# Patient Record
Sex: Female | Born: 1935 | Race: White | Hispanic: No | Smoking: Former smoker
Health system: Southern US, Community
[De-identification: ages and names within clinical notes are randomized; demographics above are authoritative.]

## PROBLEM LIST (undated history)

## (undated) DIAGNOSIS — N200 Calculus of kidney: Secondary | ICD-10-CM

## (undated) DIAGNOSIS — M4800 Spinal stenosis, site unspecified: Secondary | ICD-10-CM

## (undated) DIAGNOSIS — I2510 Atherosclerotic heart disease of native coronary artery without angina pectoris: Secondary | ICD-10-CM

## (undated) DIAGNOSIS — F32A Depression, unspecified: Secondary | ICD-10-CM

## (undated) DIAGNOSIS — H409 Unspecified glaucoma: Secondary | ICD-10-CM

## (undated) DIAGNOSIS — F329 Major depressive disorder, single episode, unspecified: Secondary | ICD-10-CM

## (undated) DIAGNOSIS — G919 Hydrocephalus, unspecified: Secondary | ICD-10-CM

## (undated) DIAGNOSIS — E785 Hyperlipidemia, unspecified: Secondary | ICD-10-CM

## (undated) DIAGNOSIS — E119 Type 2 diabetes mellitus without complications: Secondary | ICD-10-CM

## (undated) HISTORY — PX: FOOT SURGERY: SHX648

## (undated) HISTORY — PX: VENTRICULOPERITONEAL SHUNT: SHX204

## (undated) HISTORY — PX: APPENDECTOMY: SHX54

## (undated) HISTORY — PX: CARDIAC SURGERY: SHX584

## (undated) HISTORY — DX: Type 2 diabetes mellitus without complications: E11.9

## (undated) HISTORY — PX: KYPHOPLASTY: SHX5884

## (undated) HISTORY — PX: BACK SURGERY: SHX140

## (undated) HISTORY — DX: Atherosclerotic heart disease of native coronary artery without angina pectoris: I25.10

## (undated) HISTORY — DX: Calculus of kidney: N20.0

---

## 1991-11-24 HISTORY — PX: CORONARY ARTERY BYPASS GRAFT: SHX141

## 2003-12-02 ENCOUNTER — Other Ambulatory Visit: Payer: Self-pay

## 2003-12-06 ENCOUNTER — Other Ambulatory Visit: Payer: Self-pay

## 2004-03-07 ENCOUNTER — Inpatient Hospital Stay (HOSPITAL_COMMUNITY): Admission: RE | Admit: 2004-03-07 | Discharge: 2004-03-13 | Payer: Self-pay | Admitting: Neurosurgery

## 2004-03-13 ENCOUNTER — Inpatient Hospital Stay (HOSPITAL_COMMUNITY)
Admission: RE | Admit: 2004-03-13 | Discharge: 2004-03-28 | Payer: Self-pay | Admitting: Physical Medicine & Rehabilitation

## 2004-06-13 ENCOUNTER — Ambulatory Visit (HOSPITAL_COMMUNITY): Admission: RE | Admit: 2004-06-13 | Discharge: 2004-06-13 | Payer: Self-pay | Admitting: Internal Medicine

## 2004-07-04 ENCOUNTER — Inpatient Hospital Stay (HOSPITAL_COMMUNITY): Admission: RE | Admit: 2004-07-04 | Discharge: 2004-07-04 | Payer: Self-pay | Admitting: Neurosurgery

## 2004-07-13 ENCOUNTER — Inpatient Hospital Stay (HOSPITAL_COMMUNITY): Admission: RE | Admit: 2004-07-13 | Discharge: 2004-07-17 | Payer: Self-pay | Admitting: Neurosurgery

## 2004-07-13 ENCOUNTER — Encounter (INDEPENDENT_AMBULATORY_CARE_PROVIDER_SITE_OTHER): Payer: Self-pay | Admitting: *Deleted

## 2004-09-26 ENCOUNTER — Ambulatory Visit: Payer: Self-pay | Admitting: Specialist

## 2004-12-02 ENCOUNTER — Ambulatory Visit (HOSPITAL_COMMUNITY): Admission: RE | Admit: 2004-12-02 | Discharge: 2004-12-02 | Payer: Self-pay | Admitting: Neurosurgery

## 2004-12-19 ENCOUNTER — Inpatient Hospital Stay (HOSPITAL_COMMUNITY): Admission: RE | Admit: 2004-12-19 | Discharge: 2004-12-22 | Payer: Self-pay | Admitting: Neurosurgery

## 2005-01-12 ENCOUNTER — Encounter: Admission: RE | Admit: 2005-01-12 | Discharge: 2005-01-12 | Payer: Self-pay | Admitting: Neurosurgery

## 2005-03-23 ENCOUNTER — Encounter: Admission: RE | Admit: 2005-03-23 | Discharge: 2005-03-23 | Payer: Self-pay | Admitting: Neurosurgery

## 2005-04-30 ENCOUNTER — Ambulatory Visit: Payer: Self-pay | Admitting: Family Medicine

## 2005-06-20 ENCOUNTER — Encounter (HOSPITAL_COMMUNITY): Admission: RE | Admit: 2005-06-20 | Discharge: 2005-07-20 | Payer: Self-pay | Admitting: Orthopaedic Surgery

## 2005-07-24 ENCOUNTER — Encounter: Admission: RE | Admit: 2005-07-24 | Discharge: 2005-07-24 | Payer: Self-pay | Admitting: Neurosurgery

## 2005-11-19 ENCOUNTER — Encounter: Admission: RE | Admit: 2005-11-19 | Discharge: 2005-11-19 | Payer: Self-pay | Admitting: Neurosurgery

## 2005-12-16 ENCOUNTER — Emergency Department (HOSPITAL_COMMUNITY): Admission: EM | Admit: 2005-12-16 | Discharge: 2005-12-16 | Payer: Self-pay | Admitting: Emergency Medicine

## 2006-06-25 ENCOUNTER — Encounter: Admission: RE | Admit: 2006-06-25 | Discharge: 2006-06-25 | Payer: Self-pay | Admitting: Neurosurgery

## 2006-08-21 ENCOUNTER — Ambulatory Visit: Payer: Self-pay | Admitting: Family Medicine

## 2007-05-06 ENCOUNTER — Encounter: Payer: Self-pay | Admitting: Internal Medicine

## 2007-05-06 ENCOUNTER — Ambulatory Visit (HOSPITAL_COMMUNITY): Admission: RE | Admit: 2007-05-06 | Discharge: 2007-05-06 | Payer: Self-pay | Admitting: Internal Medicine

## 2007-05-06 ENCOUNTER — Ambulatory Visit: Payer: Self-pay | Admitting: Internal Medicine

## 2007-09-19 ENCOUNTER — Encounter: Admission: RE | Admit: 2007-09-19 | Discharge: 2007-09-19 | Payer: Self-pay | Admitting: Neurosurgery

## 2007-10-01 ENCOUNTER — Encounter (HOSPITAL_COMMUNITY): Admission: RE | Admit: 2007-10-01 | Discharge: 2007-10-31 | Payer: Self-pay | Admitting: Orthopaedic Surgery

## 2007-10-23 ENCOUNTER — Ambulatory Visit: Payer: Self-pay | Admitting: Family Medicine

## 2008-03-10 ENCOUNTER — Ambulatory Visit (HOSPITAL_COMMUNITY): Admission: RE | Admit: 2008-03-10 | Discharge: 2008-03-10 | Payer: Self-pay | Admitting: Internal Medicine

## 2008-04-20 ENCOUNTER — Encounter: Admission: RE | Admit: 2008-04-20 | Discharge: 2008-04-20 | Payer: Self-pay | Admitting: Neurosurgery

## 2008-09-21 ENCOUNTER — Encounter (HOSPITAL_COMMUNITY): Admission: RE | Admit: 2008-09-21 | Discharge: 2008-10-21 | Payer: Self-pay | Admitting: Orthopedic Surgery

## 2008-10-21 ENCOUNTER — Encounter: Admission: RE | Admit: 2008-10-21 | Discharge: 2008-10-21 | Payer: Self-pay | Admitting: Neurology

## 2008-10-27 ENCOUNTER — Encounter (HOSPITAL_COMMUNITY): Admission: RE | Admit: 2008-10-27 | Discharge: 2008-11-26 | Payer: Self-pay | Admitting: Orthopedic Surgery

## 2009-03-23 ENCOUNTER — Ambulatory Visit (HOSPITAL_COMMUNITY): Admission: RE | Admit: 2009-03-23 | Discharge: 2009-03-23 | Payer: Self-pay | Admitting: Internal Medicine

## 2009-04-07 ENCOUNTER — Ambulatory Visit (HOSPITAL_COMMUNITY): Admission: RE | Admit: 2009-04-07 | Discharge: 2009-04-07 | Payer: Self-pay | Admitting: Internal Medicine

## 2010-06-20 ENCOUNTER — Ambulatory Visit (HOSPITAL_COMMUNITY): Admission: RE | Admit: 2010-06-20 | Discharge: 2010-06-20 | Payer: Self-pay | Admitting: Internal Medicine

## 2011-01-06 ENCOUNTER — Encounter: Payer: Self-pay | Admitting: Neurosurgery

## 2011-05-04 NOTE — Op Note (Signed)
[name], [name]             ACCOUNT NO.:  [id number]   MEDICAL RECORD NO.:  [id number]          PATIENT TYPE:  INP   LOCATION:  2899                         FACILITY:  [facility]   PHYSICIAN:  Clydene Fake, M.D.  DATE OF BIRTH:  20-Dec-1932   DATE OF PROCEDURE:  12/19/2004  DATE OF DISCHARGE:                                 OPERATIVE REPORT   PREOPERATIVE DIAGNOSIS:  Hydrocephalus, obstructive, chronic.   POSTOPERATIVE DIAGNOSIS:  Hydrocephalus, obstructive, chronic.   OPERATION PERFORMED:  Right posterior ventriculoperitoneal shunt placement  with valve programmed to 150 mmHg.   SURGEON:  Clydene Fake, M.D.   ASSISTANT:  Danae Orleans. Venetia Maxon, M.D.   ANESTHESIA:  General endotracheal.   ESTIMATED BLOOD LOSS:  Minimal.   BLOOD REPLACED:  None.   DRAINS:  None.   COMPLICATIONS:  None.   INDICATIONS FOR PROCEDURE:  The patient is a 77 year old woman with trouble  walking, found to have enlarged left ventricles and the patient is brought  in for ventriculoperitoneal shunt.   DESCRIPTION OF PROCEDURE:  The patient was brought to the operating room and  general anesthesia was induced.  The patient was prepped and draped in a  sterile fashion on the right side.  An incision was then made after  injecting the area with 10 mL of 1% lidocaine with epinephrine in the right  parietal area.  Incision taken down to the skull and hemostasis obtained  with Bovie cautery.  The high speed drill was used to make a bur hole.  Hemostasis of the edges obtained with bone wax and Gelfoam and Thrombin.  This was then washed out.  The dura was coagulated with bipolar.  In the  right upper quadrant, the site of incision was injected with 10 mL of 1%  lidocaine with epinephrine.  The incision was then made just below the  costal margin.  Incision taken down to the fascia.  Shunt passer was then  passed from the incision in the head down to the incision in the abdomen.  The dura was incised with  a 15 blade.  Hemostasis was obtained with bipolar  cautery.  Ventricular catheter was then placed into the ventricles and a  manometer was used to measure the pressure.  It measured 17 cm.  The  programmable valve was programmed to 150 mmHg.  The valve and __________  distal tubing was then passed out through the shunt passer.  The shunt  passer then removed.  The ventricular catheter was connected to the valve  and a tie was used to tie the two together.  4-0 Nurolon was used to suture  the valve and tubing to the periosteum.  Then the scalp incision was then  closed with 2-0 and 3-0 Vicryl interrupted suture and the skin closed with  staples.  The abdominal fascia was opened and dissected down into the  peritoneum.  The shunt catheter was placed into the peritoneum.  Purse-  string suture was placed around the peritoneum and then the fascia closed  with 2-0 Vicryl interrupted sutures.  The subcutaneous tissue was closed  with 2-0  interrupted inverted sutures and the skin closed with benzoin and  Steri-Strips.  Prior to placing the tubing into the peritoneum, we did have  spontaneous flow of CSF.  The patient tolerated the procedure well.  Dressings were placed.  The patient was awakened from anesthesia and  transferred to recovery room in stable condition.       JRH/MEDQ  D:  12/19/2004  T:  12/19/2004  Job:  [id number]

## 2011-05-04 NOTE — Discharge Summary (Signed)
[name], [name]             ACCOUNT NO.:  [id number]   MEDICAL RECORD NO.:  [id number]          PATIENT TYPE:  INP   LOCATION:  3030                         FACILITY:  [facility]   PHYSICIAN:  Clydene Fake, M.D.  DATE OF BIRTH:  03-14-1933   DATE OF ADMISSION:  12/19/2004  DATE OF DISCHARGE:  12/22/2004                                 DISCHARGE SUMMARY   DIAGNOSIS:  Hydrocephalus.   DISCHARGE DIAGNOSIS:  Hydrocephalus.   PROCEDURE:  Right VP shunt now programmed to 150 mm.   REASON FOR ADMISSION:  The patient is a 77 year old woman with gait  disturbance and was found to have enlarged lateral ventricles.  The patient  was brought in for a VP shunt.   HOSPITAL COURSE:  The patient was admitted on the day of surgery and  underwent the above procedure without complications.  Postoperatively, the  patient was transferred to the recovery room and to the stepdown intensive  care unit.  The following day, she was doing well with a mild headache and  incisional soreness, but was very mild.  Incision was clean, dry, and  intact.  The patient was awake, alert, oriented.  We started increasing her  activity and transferred her to the floor on 12/20/2004.  She continued to  do well, although did have a little vomiting on the first postoperative day.  We continued to work on increasing her activity.  It was maybe slightly  improved, but not significantly.  Mild slight headache.  Incision was clean,  dry, and intact.  No more nausea and vomiting.  She was doing well by  12/22/2004, and was discharged home on the same medications as  prehospitalization plus Vicodin p.r.n. for pain.   DIET:  As tolerated.   FOLLOWUP:  In my office in one to two weeks for a wound check.      JRH/MEDQ  D:  02/15/2005  T:  02/15/2005  Job:  [id number]

## 2011-05-04 NOTE — Op Note (Signed)
NAME:  [name], [name]                       ACCOUNT NO.:  [id number]   MEDICAL RECORD NO.:  [id number]                   PATIENT TYPE:  INP   LOCATION:  3009                                 FACILITY:  [facility]   PHYSICIAN:  Clydene Fake, M.D.               DATE OF BIRTH:  1933/01/08   DATE OF PROCEDURE:  07/13/2004  DATE OF DISCHARGE:                                 OPERATIVE REPORT   DIAGNOSIS:  Compression fracture.   POSTOPERATIVE DIAGNOSIS:  Compression fracture.   PROCEDURE:  L1 kyphoplasty with intravertebral injection of  methylmethacrylates and L1 transpedicular bone biopsy.   SURGEON:  Colon Branch, M.D.   ANESTHESIA:  General endotracheal tube anesthesia.   ESTIMATED BLOOD LOSS:  Minimal.   BLOOD GIVEN:  None.   DRAINS:  None.   COMPLICATIONS:  None.   REASON FOR PROCEDURE:  Patient is a 77 year old woman who had an L4-L5  fusion for unstable spondylolisthesis and stenosis who developed an L1  compression fracture.  She was ambulating better after the surgery until  this fracture occurred and then suffered pain and some more trouble with  gait.  We discussed surgical procedure with patient and family.  It was  decided to brace it and give a little time, but as we followed this the L1  compression fracture has progressed with worsening kyphosis and compression.  The patient brought in for kyphoplasty.   PROCEDURE IN DETAIL:  The patient was brought in the operating room, general  anesthesia induced.  The patient was placed in the prone position and rolls  and all pressure points padded.  The patient was prepped and draped in a  sterile fashion and AP and lateral fluoroscopic imaging was set into place.  Using fluoroscopy, we found the L1 pedicles bilaterally and on the entry  point in the skin injected with 5 mL of 1% lidocaine with epinephrine first  on the left side and made a stab wound incision with a #10 blade.  The bone  biopsy/trocar was placed down  this hole towards the L1 pedicle under  fluoroscopic guidance.  Pedicle was entered and we tapped the probe into  place through the pedicle into the posterior aspect of the vertebral body,  with the trajectory following that tube down that would take it to the  anterior margin of the vertebral body, staying within the vertebral body.  The trocar was removed and a biopsy was placed through the orbital port.  This was tapped down through the vertebral body while on lateral  fluoroscopic imaging and we then removed the biopsy device and we did obtain  a good bone core.  A drill was used to continue to create a path in the L1  vertebral body.  A kyphoplasty balloon was then placed down and just  slightly inflated to hold it in place.  We had good position of this on AP  and lateral fluoroscopic  imaging.  This process from the entry point was  then repeated on the right side.  Then with fluoroscopy, a stab wound  incision was made, trocar placed down to the pedicle.  We we had the good  trajectory we put the trocar through the pedicle into the intervertebral  body on the right side, removing the center trocar again, took another  biopsy through the working port on the right and again obtained a good core  of the L1 vertebral body bone.  This was sent off to the lab with the other  specimen for permanent section.  I then drilled the vertebral body to make a  good channel for the balloon to keep the balloon in place, and AP and  lateral fluoroscopic imaging showed good position.  We then sequentially  alternating side-to-side  inflating the balloon, watching it on AP and  lateral fluoroscopic imaging to make sure it stayed within the vertebral  body.  It did appear that we pushed the severe endplate up, but doing this  we got 2 mL of good dilation of the balloon on the left and three on the  right with good position of the balloon and some restoration of vertebral  body height.  We mixed methyl  methacrylate, when it was at the appropriate  consistency, we decompressed the balloon on the right, pulling out the  balloon and then injected under fluoroscopic imaging methyl methacrylate in  the vertebral body starting on the right 1-1/2 tubes, each holding 1.5 mL of  cement was placed.  While watching it, we released the balloon left and  removed it and placed methyl methacrylate into the left side.  We ended up  getting one full tube and left two on the right, with good fluoroscopic  appearance the positioning of the cement.  Work channel was then removed  after tapping down the cement to make sure it stayed within the vertebral  body.  Final PA and lateral fluoroscopic images were obtained showing good  position of the methyl methacrylate within the vertebral body and L1.  One 3-  0 Vicryl interrupted suture was closed in each stab wound incision, incision  closed with Dermabond skin glue and now a dry Band-Aid was placed over each  incision.  The patient was then awoken from anesthesia and transferred to  the recovery room after being placed back in a supine position.  The patient  tolerated the procedure well.                                               Clydene Fake, M.D.    JRH/MEDQ  D:  07/13/2004  T:  07/13/2004  Job:  [id number]

## 2011-05-04 NOTE — Op Note (Signed)
[name], [name]                         ACCOUNT NO.:  [id number]   MEDICAL RECORD NO.:  [id number]                   PATIENT TYPE:  INP   LOCATION:  2899                                 FACILITY:  [facility]   PHYSICIAN:  Clydene Fake, M.D.               DATE OF BIRTH:  09-03-1933   DATE OF PROCEDURE:  03/07/2004  DATE OF DISCHARGE:                                 OPERATIVE REPORT   PREOPERATIVE DIAGNOSIS:  Unstable spondylolisthesis, stenosis, and  spondylosis.   POSTOPERATIVE DIAGNOSIS:  Unstable spondylolisthesis, stenosis, and  spondylosis.   OPERATION PERFORMED:  Gill decompressive laminectomy at L4-5, posterior  lumbar interbody fusion at L4-5 with Brantigan interbody connect cages at L4-  5, Expedium nonsegmented pedicle screw fixation, L4-5, posterolateral fusion  at L4-5, along with autograft from same incision and allograft and using the  Symphony system.   SURGEON:  Clydene Fake, M.D.   ASSISTANT:  Hilda Lias, M.D.   ANESTHESIA:  General endotracheal.   ESTIMATED BLOOD LOSS:  200 mL.   BLOOD REPLACED:  None.   DRAINS:  None.   COMPLICATIONS:  None.   INDICATIONS FOR PROCEDURE:  The patient is a 77 year old woman who has been  having difficulty with ambulation, only ambulates short distances and has to  stop and rest.  There is some decrease in sensation and reflexes in lower  extremities.  Gait is done with some shuffling steps.  MRI lumbar spine and  x-rays in flexion and extension were done showing severe stenosis at L4-5  with spondylolisthesis that does move between flexion and extension showing  instability.  Patient brought in for decompression and fusion.   DESCRIPTION OF PROCEDURE:  The patient was brought to the operating room and  general anesthesia induced.  Patient was placed in prone position on Wilson  frame with all pressure points padded.  Patient was prepped and draped in  sterile fashion.  Site of incision was injected with 20  mL of 1% lidocaine  with epinephrine.  Incision was then made in the midline of the lower lumbar  spine.  Incision taken down to the fascia.  Hemostasis obtained with Bovie  cautery.  Fascia incised over the spinous processes of L3, 4, and 5 and  subperiosteal dissection was done of L4 spinous process and lamina out to  the facets.  Fluoroscopy was used to confirm positioning and showed we were  dissecting at the L4-5 interspace.  Then we continued with dissection  exposing the transverse processes of L4 and L5 out laterally exposing the  whole L4 lamina and bottom of 3, top of  5.  Self-retaining retractor was  then placed and again we confirmed our position with fluoroscopic imaging.  Then a laminectomy was performed  with Leksell rongeurs and Kerrison punches  removing the lamina of L4 along with the superior facet and the pars for a  Gill decompressive laminectomy over the area  of spondylolisthesis.  Significant ligamentous hypertrophy was there and the ligament was removed.  Again medial facetectomy performed.  All of this decompressed the central  canal and the 4 and 5 nerve roots.  When we were finished, we had good  central and lateral decompression.  The space over the disk space, we got  hemostasis with bipolar cautery and the disk spaces were then incised  bilaterally with 15 blade and diskectomy performed with pituitary rongeurs  and curets.  The Brantigan interbody instrumentation was used to clean out  the disk space using various approaches and then we distracted the  interspace up to 11 mm and the final cutting broach was used to prepare the  end plates for cage placement.  Again we used curets to clean off the end  plates and remove all the disk material with pituitary rongeurs.  All bone  that was removed during laminectomy was cleaned from soft tissue, chopped up  into small pieces and placed through the Symphony system to get it full of  the platelet rich plasma.  This  bone was packed into two 11 high by 9 wide  Brantigan interbody cages.  More of this autograft bone was then packed into  the interspace and then we tapped the cage in place. I pulled on the  distraction on one side to remove the distractor and then I tapped and sunk  the cage in place after packing the interspace with bone.  X-rays showed  good position of cages and better alignment of the spine than  preoperatively.  Attention was then turned to the __________  high speed  drill was used to decorticate L4 pedicle entry point on the left side and  pedicle probe placed down and used a ball probe to make we had bony edge  around the circumference.  __________  tapped.  On the left side, a 50 mm x  6 mm screw was placed.  Repeat this process at L5 on the left and a 6 x 40  mm screw was placed.  We did decorticate the transverse processes of lateral  facets and we packed on the left side autograft that was Symphony bone into  the __________  posterolateral fusion L4-5.  We then went to the right side.  We again found a pedicle entry point using a probe.  Placed a probe down the  pedicle and tapped it with ball probe to make sure we had a good bony  circumference and then placed a screw.  We used a 6 x 45 screw on the L4, a  7 x 40 mm screw at L5 on the right side.  We decorticated the transverse  processes and lateral facets and placed the allograft  Symphony mixture bone  into the posterolateral gutter for posterolateral fusion L4-5 on the right  side.  Two rods were placed into the screws on each side, locking nuts  placed.  These were finally tightened.  Final AP and lateral fluoroscopic  imaging was obtained showing good position of instrumentation.  Retractors  then removed.  Hemostasis obtained with Gelfoam and Thrombin.  We explored  the nerve roots again making sure we had good decompression.  Placed two  pieces of Gelfoam over the bone graft at the nerve roots.  Paraspinal muscles  were then closed with interrupted suture.  The fascia was closed  with 0 Vicryl interrupted suture, the subcutaneous tissue was closed with 2-  0 and 3-0 Vicryl interrupted suture and skin  closed with Benzoin and Steri-  Strips.  Dry sterile dressing placed.  The patient was placed back into  supine position, awakened from anesthesia and transferred to recovery room  in stable condition.                                               Clydene Fake, M.D.    JRH/MEDQ  D:  03/07/2004  T:  03/08/2004  Job:  [id number]

## 2011-05-04 NOTE — Discharge Summary (Signed)
[name], [name]                         ACCOUNT NO.:  [id number]   MEDICAL RECORD NO.:  [id number]                   PATIENT TYPE:  IPS   LOCATION:  4143                                 FACILITY:  [facility]   PHYSICIAN:  Ranelle Oyster, M.D.             DATE OF BIRTH:  12-04-1933   DATE OF ADMISSION:  03/13/2004  DATE OF DISCHARGE:  03/28/2004                                 DISCHARGE SUMMARY   DISCHARGE DIAGNOSES:  1. L4-L5 posterior lumbar interbody fusion/decompression with pedicle screw,     secondary spinal stenosis and spondylosis.  2. History of elevated cholesterol.  3. History of depression.  4. Urinary retention, resolved.  5. Hypoxia, resolved.  6. Glaucoma.   HISTORY OF PRESENT ILLNESS:  The patient is a 77 year old white female with  past medical history of cardiovascular disease and back pain which has been  progressing with difficulty walking. Admitted on March 07, 2004 for L4-L5  PLIF and decompression with pedicle screw secondary to spinal stenosis and  spondylosis and spondylolisthesis by Dr. Colon Branch. No DVT prophylaxis at  this time. Hospital course significant for a urinary tract infection and  hypoxia. The patient completed a course of Cipro for urinary tract  infection. PT report at this time indicated patient is transferred min  assist, bed mobility mod assist, ambulating 25 feet with mod assist with  rolling walker. The patient was transferred to [facility] rehab department  for more therapies.   PAST MEDICAL HISTORY:  1. Depression as above.  2. Ulcers.  3. Glaucoma.   PAST SURGICAL HISTORY:  Significant for  1. __________  2. Appendectomy.  3. Ankle surgery.  4. Right hand surgery.   MEDICATIONS PRIOR TO ADMISSION:  1. Zocor 40 mg p.o. q.d.  2. Aspirin 81 mg p.o. q.d.  3. Lexapro 10 mg p.o. q.d.  4. Multivitamin.  5. Xalatan two drops daily.   ALLERGIES:  PENICILLIN and SULFA.   PRIMARY CARE PHYSICIAN:  Dr. Rhoderick Moody.   SOCIAL HISTORY:  The patient lives alone in a one level home in [place],  [place]. Three to four steps to entry. Will reside with daughter  after discharge for one week. Daughter works. She was independent prior to  admission, ambulating with a cane. Denies any tobacco or alcohol use at this  time.   FAMILY HISTORY:  Noncontributory.   REVIEW OF SYSTEMS:  Significant for joint pain, shortness of breath, and  ulcers.   HOSPITAL COURSE:  [name] is admitted to [facility] rehab  department on March 13, 2004 for comprehensive patient rehabilitation to  receive more than three hours of therapy. Overall, [name]  progressed very slowly during her week and a half stay in rehab. She  remained on Lovenox 40 mg subcu daily for DVT prophylaxis. Hospital course  is significant for urinary retention, mild hypoxia, anemia, and occasional  constipation.   On March 14, 2004, the patient was placed on Urecholine 10 mg p.o. t.i.d.  and Flomax 0.4 mg p.o. q.h.s. to help with urinary retention. On March 15, 2004, Urecholine was increased from 10 to 25 mg p.o. t.i.d. The patient  began to urinate, and Urecholine was tapered off and Flomax was discontinued  on March 20, 2004. For constipation, the received Fleet's enema and Sorbitol  as needed. The patient had an admission hemoglobin of 10.4, hematocrit 30.4.  She received multivitamin with iron, and repeat hemoglobin on March 27, 2004  was 12.2, hematocrit 34.9. The patient at the time of admission was oxygen.  The patient was weaned off oxygen, and O2 saturations did improve above 90%.  Therapist noted throughout patient's course the patient did have  parkinsonian-like features with shuffling gait, making it very difficult for  her to ambulate. Her surgical incision healed very well, demonstrated no  signs of infection. The patient had difficulty donning and doffing the brace  alone. The patient did remain in brace  sitting and standing and walking.  Pain was under reasonably good control with Ultram 50 mg p.o. q.6-8h.  Oxycodone was discontinued secondary to possibly causing lethargy or  confusion. Due to very slow progress in rehab, it was thought best the  patient could benefit from longer term therapy at a skilled nursing  facility. She was discharged on April 11, ambulating approximately close  supervision 100 feet. No other major medical issues occurred while patient  was in rehab. She remained on Lexapro 10 mg daily for depression and Zocor  40 mg p.o. q.h.s. for history of hypercholesterolemia.   Latest labs indicated her latest hemoglobin was 12.2, hematocrit 34.9.  Latest sodium 137, potassium 3.6, chloride 97, CO2 31, glucose 103. AST 45,  ALT 76, creatinine 0.8, calcium 9.5. Urine culture on March 13, 2004  __________ growth. At time of discharge, her incision showed no signs of  infection. Vitals stable. The patient was able to transfer modified  independent, bed mobility supervision, standing balance supervision with  rolling walker, base ambulation close supervision with rolling walker  ambulating 100 feet, and performed most ADLs min assist to supervision  level. The patient was discharged to [facility] skilled nursing  facility on March 28, 2004.   DISCHARGE MEDICATIONS:  1. Zocor 40 mg p.o. q.h.s.  2. Xalatan one drop __________ q.h.s.  3. Timoptic one drop OU daily.  4. Lexapro 10 mg daily.  5. Os-Cal one tablet daily.  6. __________ p.r.n.  7. Ultram 50 mg q.6-8h. p.r.n.   DISCHARGE INSTRUCTIONS:  The patient has no weight bearing restrictions at  all. The patient is to follow up with Dr. Colon Branch within two weeks.  Follow up with primary care physician within four to six weeks. Follow up  with Dr. Riley Kill as needed. Recommend continuing physical therapy and  occupational therapy so patient will be made independent so she can return home. Also recommend patient  observe back precautions; wear back brace when  sitting, standing, and walking. May don and doff brace in sitting position.      Drucilla Schmidt, P.A.                         Ranelle Oyster, M.D.    LB/MEDQ  D:  03/28/2004  T:  03/28/2004  Job:  [id number]   cc:   Clydene Fake, M.D.  [address], [address]  [place]  [place]  [address]  Fax: [phone]   Rhoderick Moody, M.D.

## 2011-05-04 NOTE — Op Note (Signed)
NAME:  [name], ASLINGER             ACCOUNT NO.:  [id number]   MEDICAL RECORD NO.:  [id number]          PATIENT TYPE:  AMB   LOCATION:  DAY                           FACILITY:  [facility]   PHYSICIAN:  R. Roetta Sessions, M.D. DATE OF BIRTH:  06-Oct-1933   DATE OF PROCEDURE:  05/06/2007  DATE OF DISCHARGE:                               OPERATIVE REPORT   PROCEDURE:  Screening colonoscopy with biopsy of lesion and ablation.   INDICATIONS FOR PROCEDURE:  A 77 year old Caucasian female from [facility] with no lower GI tract symptoms, sent over courtesy of Dr. Micah Noel  down in [place] for colorectal cancer screening.  She has never had  her lower GI tract evaluated and there is no family history of  colorectal neoplasia.  Colonoscopy is now being done as standard  screening maneuver.  This approach has been discussed at length.  Potential risks, benefits and alternatives have been reviewed and  questions answered.   PROCEDURE NOTE:  Oxygen saturation, blood pressure, pulse, and  respirations monitored the entire procedure.  Conscious sedation with  Versed 2.5 mg IV, Demerol 250 mg IV in divided doses.  Instrument Pentax  video chip system.   FINDINGS:  Digital rectal exam revealed no abnormalities.  The prep was  adequate.   Colon:  Colonic mucosa was surveyed from the rectosigmoid junction  through the left, transverse, and right colon to this appendiceal  orifice, ileocecal valve and cecum.  These structures well seen and  photographed for the record.  From this level the scope was carefully  withdrawn and all previous mucosal surfaces were again seen.  The  patient had extensive left-sided diverticula and multiple 1-3 mm  diminutive, hyperplastic-appearing polyps in the proximal rectum and  distal sigmoid segments.  Remainder of the colonic mucosa appeared  normal.  Thorough examination of  the rectal mucosa and with retroflex  view of the anal verge demonstrated no other  abnormalities.  One of  these polyps was biopsied.  The remaining polyps were ablated with the  tip of the hot snare cautery unit.  The patient tolerated the procedure  well, was reacted after endoscopy.   IMPRESSION:  1. Diminutive rectosigmoid polyps, biopsied and ablated as described      above; otherwise normal rectum.  2. Left-sided diverticula.  3. Colon mucosa appeared normal.   RECOMMENDATIONS:  1. Diverticulosis literature provided Mrs. Shreiner.  2. Follow up on pathology.  3. Further recommendations to follow.      Jonathon Bellows, M.D.  Electronically Signed     RMR/MEDQ  D:  05/06/2007  T:  05/06/2007  Job:  [id number]   cc:   Dr. Micah Noel

## 2011-05-04 NOTE — Discharge Summary (Signed)
NAME:  [name], [name]                       ACCOUNT NO.:  [id number]   MEDICAL RECORD NO.:  [id number]                   PATIENT TYPE:  INP   LOCATION:  3009                                 FACILITY:  [facility]   PHYSICIAN:  Clydene Fake, M.D.               DATE OF BIRTH:  August 30, 1933   DATE OF ADMISSION:  07/13/2004  DATE OF DISCHARGE:  07/17/2004                                 DISCHARGE SUMMARY   DIAGNOSIS:  L1 compression fracture.   DISCHARGE DIAGNOSIS:  L1 compression fracture.   PROCEDURE:  L1 kyphoplasty with methylmethacrylate and L1 bone biopsy.   REASON FOR ADMISSION:  The patient is a 77 year old woman who is status post  L4-L5 fusion for unstable spondylolisthesis and stenosis who subsequently  developed L1 compression fracture with continued trouble walking and some  back pain.  Patient brought in for kyphoplasty.   HOSPITAL COURSE:  The patient was admitted the day of surgery, underwent the  procedure above without complications.  Postop the patient was transferred  to the recovery room then to the floor.  She reported no back pain that  first postop day.  Incision check clean, dry, intact, doing well.  Physical  therapy was consulted and started working with the patient to assist with  ambulation.  Home health was already set up to help after discharge.  She  continued to try to make some slow progress.  Still had walking difficulty  but no pain.  She slowly progressed and by July 17, 2004 was stable.  Incision was clean, dry, intact.  Gait was at baseline.  No pain.  She was  discharged home with home health for continued home therapy.   DISCHARGE MEDICATIONS:  Same as pre-hospitalization.   FOLLOW UP:  In 2 weeks in my office.   ACTIVITY:  No strenuous activity.                                                Clydene Fake, M.D.    JRH/MEDQ  D:  08/10/2004  T:  08/11/2004  Job:  [id number]

## 2011-05-04 NOTE — H&P (Signed)
[name], [name]             ACCOUNT NO.:  [id number]   MEDICAL RECORD NO.:  [id number]          PATIENT TYPE:  INP   LOCATION:  2899                         FACILITY:  [facility]   PHYSICIAN:  Clydene Fake, M.D.  DATE OF BIRTH:  [birth year]   DATE OF ADMISSION:  12/19/2004  DATE OF DISCHARGE:                                HISTORY & PHYSICAL   CHIEF COMPLAINT:  Trouble walking.   HISTORY:  The patient is a 77 year old woman who has been having progressive  trouble walking.  Found to have enlarged ventricles.  She has undergone  lumbar decompression and fusion for restenosis in 2005, but continues to  have problems walking.  MRI was done showing some probable obstruction  hydrocephalus chronic in nature.  The patient was brought in for VP shunt.   PAST MEDICAL HISTORY:  Significant for:  1.  Heart attack.  2.  Kidney stones.   PAST SURGICAL HISTORY:  Previous surgery include:  1.  __________ 11 years ago.  2.  Foot surgery in 2000.  3.  Kidney stones in 2004.  4.  Lumbar laminectomy and fusion in 2005.  5.  Kyphoplasty of thoracic fracture in 2005.   MEDICATIONS:  Zocor, aspirin, __________ and Xalatan drops.   DRUG ALLERGIES:  PENICILLIN caused a rash.  CODEINE and SULFA caused nausea.   SOCIAL HISTORY:  She is widowed.  She does not smoke or use alcohol.   REVIEW OF SYSTEMS:  Otherwise negative.   FAMILY HISTORY:  Noncontributory.   PHYSICAL EXAMINATION:  HEENT:  Unremarkable.  NECK:  Supple.  LUNGS:  Clear.  HEART:  Regular rate and rhythm.  ABDOMEN:  Soft and nontender.  EXTREMITIES:  Shuffling-type gait.   ASSESSMENT AND PLAN:  Patient with enlarged ventricles and chronic  hydrocephalus, probably obstructive.  The patient is brought in for VP  shunt.       JRH/MEDQ  D:  12/19/2004  T:  12/19/2004  Job:  [id number]

## 2011-05-04 NOTE — Discharge Summary (Signed)
NAMEJASIME, [name]                         ACCOUNT NO.:  [id number]   MEDICAL RECORD NO.:  [id number]                   PATIENT TYPE:  INP   LOCATION:  3033                                 FACILITY:  [facility]   PHYSICIAN:  Clydene Fake, M.D.               DATE OF BIRTH:  13-Apr-1933   DATE OF ADMISSION:  03/07/2004  DATE OF DISCHARGE:  03/13/2004                                 DISCHARGE SUMMARY   DIAGNOSIS:  Unstable spondylolisthesis, stenosis and spondylosis of the  lumbar spine.   DISCHARGE DIAGNOSIS:  Unstable spondylolisthesis, stenosis and spondylosis  of the lumbar spine.   PROCEDURE:  Gill decompression of lamina at L4-L5 and posterior lumbar  interbody fusion L4-L5 with grafting and interbody cages, __________  non-  segmented pedicle screw fixation with autograft, allograft __________ , and  a posterolateral fusion at L4-L5.   REASON FOR PROCEDURE:  The patient is a 77 year old woman who has been  having difficulty with ambulation, only able to go short distances before  she has to stop and rest, and decrease in sensation of her reflexes in the  lower extremities.  MRI was done showing severe stenosis L4-L5 and  spondylolisthesis with flexion and extension films showing instability at  that level.  The patient was for decompression and fusion procedure.   HOSPITAL COURSE:  The patient was brought into the hospital on the day of  surgery and on to the procedure above without complications.  Postoperatively, the patient was transferred to the recovery room and then  to the floor.  She was doing well.   Over the next several days, the patient slowly increased in activity and  slowly progressed with increasing her activity.  She was on nasal cannula O2  with pulmonary toilette.  We were able to wean her off O2.  She did have  slight hypertension, but she had that at baseline.  H&H was checked, 11 and  31.6 and no other intervention was done.  Intraoperative blood loss  was low,  and blood was given during surgery.  The patient was watched in ICU for the  next couple of days.  We worked on increasing her activities.   By March 24, she was ambulating a little bit more, progressing better.  Rehabilitation was consulted to have her transferred to the rehabilitation  unit.  PT, OT continued to work with the patient.  They felt she would be a  candidate, and when they had room, would accept her.  She continued making  progress.  She did have a little ileus postoperatively, but with increasing  activity, that resolved.  She continued to make slow but steady progress.   By the 28th, the incision was clean, dry and intact.  Activity was starting  to increase.  Rehabilitation had a bed and accepted her in transfer.  She  was discharged from the hospital and transferred to rehabilitation on March 13, 2004, in stable condition.                                                Clydene Fake, M.D.    JRH/MEDQ  D:  04/06/2004  T:  04/08/2004  Job:  [id number]

## 2011-07-13 ENCOUNTER — Other Ambulatory Visit (HOSPITAL_COMMUNITY): Payer: Self-pay | Admitting: Internal Medicine

## 2011-07-13 DIAGNOSIS — Z139 Encounter for screening, unspecified: Secondary | ICD-10-CM

## 2011-07-19 ENCOUNTER — Ambulatory Visit (HOSPITAL_COMMUNITY)
Admission: RE | Admit: 2011-07-19 | Discharge: 2011-07-19 | Disposition: A | Discharge status: Home / Self Care | Payer: Medicare Other | Source: Ambulatory Visit | Attending: Internal Medicine | Admitting: Internal Medicine

## 2011-07-19 DIAGNOSIS — Z139 Encounter for screening, unspecified: Secondary | ICD-10-CM

## 2011-07-19 DIAGNOSIS — Z1231 Encounter for screening mammogram for malignant neoplasm of breast: Secondary | ICD-10-CM | POA: Insufficient documentation

## 2012-01-07 ENCOUNTER — Other Ambulatory Visit: Payer: Self-pay | Admitting: Neurosurgery

## 2012-01-07 DIAGNOSIS — G911 Obstructive hydrocephalus: Secondary | ICD-10-CM

## 2012-01-11 ENCOUNTER — Ambulatory Visit
Admission: RE | Admit: 2012-01-11 | Discharge: 2012-01-11 | Disposition: A | Discharge status: Home / Self Care | Payer: Medicare Other | Source: Ambulatory Visit | Attending: Neurosurgery | Admitting: Neurosurgery

## 2012-01-11 DIAGNOSIS — G911 Obstructive hydrocephalus: Secondary | ICD-10-CM

## 2012-01-11 MED ORDER — GADOBENATE DIMEGLUMINE 529 MG/ML IV SOLN
15.0000 mL | Freq: Once | INTRAVENOUS | Status: AC | PRN
  Administered 2012-01-11: 15 mL via INTRAVENOUS

## 2012-09-30 ENCOUNTER — Other Ambulatory Visit (HOSPITAL_COMMUNITY): Payer: Self-pay | Admitting: Internal Medicine

## 2012-09-30 DIAGNOSIS — Z139 Encounter for screening, unspecified: Secondary | ICD-10-CM

## 2012-10-06 ENCOUNTER — Ambulatory Visit (HOSPITAL_COMMUNITY)
Admission: RE | Admit: 2012-10-06 | Discharge: 2012-10-06 | Disposition: A | Discharge status: Home / Self Care | Payer: Medicare Other | Source: Ambulatory Visit | Attending: Internal Medicine | Admitting: Internal Medicine

## 2012-10-06 DIAGNOSIS — Z139 Encounter for screening, unspecified: Secondary | ICD-10-CM

## 2012-10-06 DIAGNOSIS — Z1231 Encounter for screening mammogram for malignant neoplasm of breast: Secondary | ICD-10-CM | POA: Insufficient documentation

## 2012-10-19 ENCOUNTER — Emergency Department (HOSPITAL_COMMUNITY)
Admission: EM | Admit: 2012-10-19 | Discharge: 2012-10-19 | Disposition: A | Discharge status: Home / Self Care | Payer: Medicare Other | Attending: Emergency Medicine | Admitting: Emergency Medicine

## 2012-10-19 ENCOUNTER — Emergency Department (HOSPITAL_COMMUNITY): Payer: Medicare Other

## 2012-10-19 ENCOUNTER — Encounter (HOSPITAL_COMMUNITY): Payer: Self-pay | Admitting: Emergency Medicine

## 2012-10-19 DIAGNOSIS — E785 Hyperlipidemia, unspecified: Secondary | ICD-10-CM | POA: Insufficient documentation

## 2012-10-19 DIAGNOSIS — T148XXA Other injury of unspecified body region, initial encounter: Secondary | ICD-10-CM | POA: Insufficient documentation

## 2012-10-19 DIAGNOSIS — Y939 Activity, unspecified: Secondary | ICD-10-CM | POA: Insufficient documentation

## 2012-10-19 DIAGNOSIS — H409 Unspecified glaucoma: Secondary | ICD-10-CM | POA: Insufficient documentation

## 2012-10-19 DIAGNOSIS — Z79899 Other long term (current) drug therapy: Secondary | ICD-10-CM | POA: Insufficient documentation

## 2012-10-19 DIAGNOSIS — Y929 Unspecified place or not applicable: Secondary | ICD-10-CM | POA: Insufficient documentation

## 2012-10-19 DIAGNOSIS — W06XXXA Fall from bed, initial encounter: Secondary | ICD-10-CM | POA: Insufficient documentation

## 2012-10-19 DIAGNOSIS — Z8669 Personal history of other diseases of the nervous system and sense organs: Secondary | ICD-10-CM | POA: Insufficient documentation

## 2012-10-19 DIAGNOSIS — Z87442 Personal history of urinary calculi: Secondary | ICD-10-CM | POA: Insufficient documentation

## 2012-10-19 DIAGNOSIS — E119 Type 2 diabetes mellitus without complications: Secondary | ICD-10-CM | POA: Insufficient documentation

## 2012-10-19 DIAGNOSIS — R079 Chest pain, unspecified: Secondary | ICD-10-CM | POA: Insufficient documentation

## 2012-10-19 DIAGNOSIS — R0781 Pleurodynia: Secondary | ICD-10-CM

## 2012-10-19 DIAGNOSIS — Z8659 Personal history of other mental and behavioral disorders: Secondary | ICD-10-CM | POA: Insufficient documentation

## 2012-10-19 HISTORY — DX: Hyperlipidemia, unspecified: E78.5

## 2012-10-19 HISTORY — DX: Unspecified glaucoma: H40.9

## 2012-10-19 HISTORY — DX: Hydrocephalus, unspecified: G91.9

## 2012-10-19 HISTORY — DX: Spinal stenosis, site unspecified: M48.00

## 2012-10-19 HISTORY — DX: Depression, unspecified: F32.A

## 2012-10-19 HISTORY — DX: Major depressive disorder, single episode, unspecified: F32.9

## 2012-10-19 MED ORDER — ACETAMINOPHEN 500 MG PO TABS
1000.0000 mg | ORAL_TABLET | Freq: Once | ORAL | Status: AC
  Administered 2012-10-19: 1000 mg via ORAL
  Filled 2012-10-19: qty 2
  Filled 2012-10-19: qty 1

## 2012-10-19 NOTE — ED Provider Notes (Signed)
History     CSN: [id number]  Arrival date & time 10/19/12  1545   First MD Initiated Contact with Patient 10/19/12 1555      Chief Complaint  Patient presents with  . Fall    HPI Pt was seen at 1705.  Per pt, c/o gradual onset and persistence of constant left ribs "pain" that began 1 week ago.  Pt states the pain began after she fell backwards while getting out of bed.  States she hit her lateral left ribs "against something."  Pain worsens with movement and palpation of the area.  Denies hitting her head/no LOC, no neck or back pain, no CP/SOB, no abd pain, no flank pain.     Past Medical History  Diagnosis Date  . Diabetes mellitus without complication   . Hyperlipidemia   . Spinal stenosis   . Depression   . Glaucoma   . Kidney stones   . Hydrocephalus     Past Surgical History  Procedure Date  . Back surgery   . Kyphoplasty   . Foot surgery   . Ventriculoperitoneal shunt   . Appendectomy      History  Substance Use Topics  . Smoking status: Never Smoker   . Smokeless tobacco: Not on file  . Alcohol Use: No      Review of Systems ROS: Statement: All systems negative except as marked or noted in the HPI; Constitutional: Negative for fever and chills. ; ; Eyes: Negative for eye pain, redness and discharge. ; ; ENMT: Negative for ear pain, hoarseness, nasal congestion, sinus pressure and sore throat. ; ; Cardiovascular: Negative for chest pain, palpitations, diaphoresis, dyspnea and peripheral edema. ; ; Respiratory: Negative for cough, wheezing and stridor. ; ; Gastrointestinal: Negative for nausea, vomiting, diarrhea, abdominal pain, blood in stool, hematemesis, jaundice and rectal bleeding. . ; ; Genitourinary: Negative for dysuria, flank pain and hematuria. ; ; Musculoskeletal: +left ribs pain. Negative for back pain and neck pain. Negative for swelling.; ; Skin: Negative for pruritus, rash, abrasions, blisters, bruising and skin lesion.; ; Neuro: Negative for  headache, lightheadedness and neck stiffness. Negative for weakness, altered level of consciousness , altered mental status, extremity weakness, paresthesias, involuntary movement, seizure and syncope.       Allergies  Codeine; Penicillins; and Sulfa antibiotics  Home Medications   Current Outpatient Rx  Name  Route  Sig  Dispense  Refill  . CHOLINE FENOFIBRATE 135 MG PO CPDR   Oral   Take 135 mg by mouth daily.         Marland Kitchen LATANOPROST 0.005 % OP SOLN   Both Eyes   Place 1 drop into both eyes at bedtime.         Marland Kitchen MILNACIPRAN HCL 50 MG PO TABS   Oral   Take 50 mg by mouth 2 (two) times daily.         Marland Kitchen ROSUVASTATIN CALCIUM 20 MG PO TABS   Oral   Take 20 mg by mouth daily.         Marland Kitchen SITAGLIPTIN-METFORMIN HCL 50-1000 MG PO TABS   Oral   Take 1 tablet by mouth 2 (two) times daily with a meal.           BP 132/75  Pulse 90  Temp 98.9 F (37.2 C) (Oral)  Resp 18  Ht 5' 3.5" (1.613 m)  Wt 153 lb (69.4 kg)  BMI 26.68 kg/m2  SpO2 97%  Physical Exam 1710: Physical examination: Vital signs  and O2 SAT: Reviewed; Constitutional: Well developed, Well nourished, Well hydrated, In no acute distress; Head and Face: Normocephalic, Atraumatic; Eyes: EOMI, PERRL, No scleral icterus; ENMT: Mouth and pharynx normal, Mucous membranes moist; Neck: Supple, Trachea midline; Spine: No midline CS, TS, LS tenderness. No ecchymosis or abrasions.; Cardiovascular: Regular rate and rhythm, No gallop; Respiratory: Breath sounds clear & equal bilaterally, No rales, rhonchi, wheezes, Normal respiratory effort/excursion; Chest: +left lower lateral ribs tenderness to palp. No deformity, Movement normal, No crepitus, No abrasions or ecchymosis.; Abdomen: Soft, Nontender, Nondistended, Normal bowel sounds, No abrasions or ecchymosis.; Genitourinary: No CVA tenderness;; Extremities: No deformity, Full range of motion major/large joints of bilat UE's and LE's without pain or tenderness to palp,  Neurovascularly intact, Pulses normal, No tenderness, No edema, Pelvis stable; Neuro: AA&Ox3, Major CN grossly intact. Speech clear. No gross focal motor or sensory deficits in extremities.; Skin: Color normal, Warm, Dry.   ED Course  Procedures    MDM  MDM Reviewed: nursing note, vitals and previous chart Interpretation: x-ray     Dg Ribs Unilateral W/chest Left 10/19/2012  *RADIOLOGY REPORT*  Clinical Data: Larey Seat 6 days ago.  Left lateral mid to lower left rib pain.  Shortness of breath.  LEFT RIBS AND CHEST - 3+ VIEW  Comparison: Two-view chest x-ray 03/10/2008, 12/19/2004.  No prior rib imaging.  Findings: No fractures identified involving the left ribs. Osteopenia.  Prior sternotomy for CABG.  Cardiac silhouette upper normal in size for the [facility] technique, unchanged.  Thoracic aorta mildly tortuous and atherosclerotic, unchanged.  Hilar and mediastinal contours otherwise unremarkable.  Lungs clear.  Bronchovascular markings normal.  Pulmonary vascularity normal.  No pneumothorax.  No pleural effusions.  Right-sided ventriculoperitoneal shunt catheter.  IMPRESSION:  1.  No left rib fractures identified. 2.  No acute cardiopulmonary disease.   Original Report Authenticated By: Hulan Saas, M.D.      [phone]:  Very long d/w family regarding pt's pain control.  Reviewed pt's meds and potential for interaction with ultram, NSAIDs.  Voiced concerns regarding narcotic pain meds "making her sleepy."  Family states they did not know pt was on the multiple medications she was on, and are requesting to give pt only tylenol at this time.  Will also give I.S.; family agreeable.  They want to take pt home now.  Dx and testing d/w pt and family.  Questions answered.  Verb understanding, agreeable to d/c home with outpt f/u.      Laray Anger, DO 10/21/12 1157

## 2012-10-19 NOTE — ED Notes (Signed)
Pt c/o left hip/back pain since falling Monday. Pt states the pain has been intermittent since then but today it has been constant.

## 2012-10-21 ENCOUNTER — Encounter (HOSPITAL_COMMUNITY): Payer: Self-pay | Admitting: Emergency Medicine

## 2012-11-10 ENCOUNTER — Other Ambulatory Visit (HOSPITAL_COMMUNITY)
Admission: RE | Admit: 2012-11-10 | Discharge: 2012-11-10 | Disposition: A | Discharge status: Home / Self Care | Payer: Medicare Other | Source: Ambulatory Visit | Attending: Obstetrics and Gynecology | Admitting: Obstetrics and Gynecology

## 2012-11-10 DIAGNOSIS — Z01419 Encounter for gynecological examination (general) (routine) without abnormal findings: Secondary | ICD-10-CM | POA: Insufficient documentation

## 2012-11-10 DIAGNOSIS — Z1151 Encounter for screening for human papillomavirus (HPV): Secondary | ICD-10-CM | POA: Insufficient documentation

## 2012-11-11 ENCOUNTER — Other Ambulatory Visit: Payer: Self-pay | Admitting: Adult Health

## 2013-06-24 ENCOUNTER — Ambulatory Visit (HOSPITAL_COMMUNITY)
Admission: RE | Admit: 2013-06-24 | Discharge: 2013-06-24 | Disposition: A | Discharge status: Home / Self Care | Payer: Medicare Other | Source: Ambulatory Visit | Attending: Internal Medicine | Admitting: Internal Medicine

## 2013-06-24 DIAGNOSIS — R5381 Other malaise: Secondary | ICD-10-CM | POA: Insufficient documentation

## 2013-06-24 DIAGNOSIS — IMO0001 Reserved for inherently not codable concepts without codable children: Secondary | ICD-10-CM | POA: Insufficient documentation

## 2013-06-24 DIAGNOSIS — E119 Type 2 diabetes mellitus without complications: Secondary | ICD-10-CM | POA: Insufficient documentation

## 2013-06-24 DIAGNOSIS — R262 Difficulty in walking, not elsewhere classified: Secondary | ICD-10-CM | POA: Insufficient documentation

## 2013-06-24 NOTE — Evaluation (Signed)
Physical Therapy Evaluation  Patient Details  Name: [name] MRN: [id number] Date of Birth: November 22, 1933  Today's Date: 06/24/2013 Time: 0930-1015 PT Time Calculation (min): 45 min Charges: 1 evaluation             Visit#: 1 of 8  Re-eval: 07/24/13 Assessment Diagnosis: Generalized weakness Next MD Visit: Dr. Dwana Melena -   Past Medical History:  Past Medical History  Diagnosis Date  . Diabetes mellitus without complication   . Hyperlipidemia   . Spinal stenosis   . Depression   . Glaucoma   . Kidney stones   . Hydrocephalus    Past Surgical History:  Past Surgical History  Procedure Laterality Date  . Back surgery    . Kyphoplasty    . Foot surgery    . Ventriculoperitoneal shunt    . Appendectomy      Subjective Symptoms/Limitations Pertinent History: Pt is referred to PT for generalized weakness with significant hx of falls.  Her last fall has been 3 months.  Back in November she fell and broke her ribs.  She reports that her Lt leg will give out and will be shaky.  She had a shunt placement about 12 years ago to her Rt cerebellar region for hydrocephallus.  Her daughter is present with her today and reports that they are frustrated with her lack of mobility.  Pt reports that she feels she is a burden and therefore avoids going out in the community.  They both wish for her gait and confidence to improve to improve her QOL.  Pt and family stated goal: Wish to improve her gait mechanics and balance to improve confidence with community activities.  Pain Assessment Currently in Pain?: Yes Reports "General Pain" all over   Balance Screening Balance Screen Has the patient fallen in the past 6 months: Yes How many times?: 1 Has the patient had a decrease in activity level because of a fear of falling? : Yes Is the patient reluctant to leave their home because of a fear of falling? : Yes  Prior Functioning  Prior Function Level of Independence: Requires assistive  device for independence Driving: No Comments: Would enjoy going out with her family, but is limited due to mobility and fells she is a "bother" to them.   Assessment RLE Assessment RLE Assessment: Exceptions to [facility] RLE Strength Right Hip Flexion: 5/5 Right Hip Extension: 5/5 Right Hip ABduction: 5/5 Right Hip ADduction: 5/5 Right Knee Flexion: 4/5 Right Knee Extension: 4/5 Right Ankle Dorsiflexion: 4/5 Right Ankle Plantar Flexion: 4/5 Right Ankle Inversion: 4/5 Right Ankle Eversion: 4/5 LLE Assessment LLE Assessment: Exceptions to [facility] LLE Strength Left Hip Flexion: 5/5 Left Hip Extension: 5/5 Left Hip ABduction: 5/5 Left Hip ADduction: 5/5 Left Knee Flexion: 4/5 Left Knee Extension: 4/5 Left Ankle Dorsiflexion: 4/5 Left Ankle Plantar Flexion: 4/5 Left Ankle Inversion: 4/5 Left Ankle Eversion: 4/5 Palpation Palpation: significant rigidy to Bil gluteal region   Mobility/Balance  Ambulation/Gait Ambulation/Gait: Yes Ambulation Distance (Feet): 117 Feet (2 minutes) Assistive device: Rolling walker Gait Pattern: Shuffle;Trunk flexed Berg Balance Test Sit to Stand: Able to stand  independently using hands Standing Unsupported: Able to stand 30 seconds unsupported Sitting with Back Unsupported but Feet Supported on Floor or Stool: Able to sit safely and securely 2 minutes Stand to Sit: Controls descent by using hands Transfers: Able to transfer with verbal cueing and /or supervision Standing Unsupported with Eyes Closed: Able to stand 10 seconds with supervision Standing Ubsupported with Feet  Together: Needs help to attain position but able to stand for 30 seconds with feet together From Standing, Reach Forward with Outstretched Arm: Can reach forward >5 cm safely (2") From Standing Position, Pick up Object from Floor: Unable to pick up shoe, but reaches 2-5 cm (1-2") from shoe and balances independently From Standing Position, Turn to Look Behind Over each Shoulder: Turn  sideways only but maintains balance Turn 360 Degrees: Able to turn 360 degrees safely but slowly Standing Unsupported, Alternately Place Feet on Step/Stool: Needs assistance to keep from falling or unable to try Standing Unsupported, One Foot in Front: Needs help to step but can hold 15 seconds Standing on One Leg: Unable to try or needs assist to prevent fall Total Score: 27 Timed Up and Go Test TUG: Normal TUG Normal TUG (seconds): 53 (RW min A)   Physical Therapy Assessment and Plan PT Assessment and Plan Clinical Impression Statement: Pt is a 79 year old female referred to PT for gait abnormalities with impairments listed below.  After evaluation it was found she has considerable gait and balance deficits leading to decreased QOL for herself and her family.  Discussed in great detail with her daughter about benefits of scheudling time for her to practice walking in community at small shoppes to improve her activity tolerance and build her confidence.   Pt will benefit from skilled therapeutic intervention in order to improve on the following deficits: Abnormal gait;Difficulty walking;Decreased activity tolerance;Decreased balance PT Frequency: Min 2X/week PT Duration: 4 weeks PT Treatment/Interventions: Gait training;Stair training PT Plan: cueing for gait training, improve berg, improve TUG, otoga balance program    Goals Home Exercise Program Pt/caregiver will Perform Home Exercise Program: independently;with written HEP provided PT Goal: Perform Home Exercise Program - Progress: Goal set today PT Short Term Goals Time to Complete Short Term Goals: 2 weeks PT Short Term Goal 1: Pt will improve her activity tolerance and demonstrate ambulation x8 minutes with RW without sitting rest breaks.  PT Short Term Goal 2: Pt will improve her LE coordination with improved gait mechanics to decrease energy expenditure.  PT Long Term Goals Time to Complete Long Term Goals:  (6 weeks) PT Long  Term Goal 1: Pt will improve her TUG to less than 20 seconds for improved safety in the community.  PT Long Term Goal 2: Pt will improve her berg balance test to greater than 45/56 to decrease risk of falls.  Long Term Goal 3: Pt will improve her activity tolerance in order to ambulate for greater than 15 minutes with her RW in order to safely attend dinner with her family.   Problem List Patient Active Problem List   Diagnosis Date Noted  . Difficulty in walking(719.7) 06/24/2013    PT Plan of Care PT Home Exercise Plan: see scanned report PT Patient Instructions: walking program, progression from small to big shops, scheduling time to go for practice and not for shopping. Discussed having a family discussion of the patients wishes and what her real goals are for herself and for the family to listen to her wishes even if it is not what they wish for.  Consulted and Agree with Plan of Care: Patient;Family member/caregiver Family Member Consulted: daughter  GP Functional Limitation: Mobility: Walking and moving around Mobility: Walking and Moving Around Current Status [phone]): At least 40 percent but less than 60 percent impaired, limited or restricted Mobility: Walking and Moving Around Goal Status [phone]): At least 20 percent but less than 40 percent  impaired, limited or restricted  Cedrick Partain, MPT, ATC 06/24/2013, 6:27 PM  Physician Documentation Your signature is required to indicate approval of the treatment plan as stated above.  Please sign and either send electronically or make a copy of this report for your files and return this physician signed original.   Please mark one 1.__approve of plan  2. ___approve of plan with the following conditions.   ______________________________                                                          _____________________ Physician Signature                                                                                                              Date

## 2013-06-26 ENCOUNTER — Ambulatory Visit (HOSPITAL_COMMUNITY)
Admission: RE | Admit: 2013-06-26 | Discharge: 2013-06-26 | Disposition: A | Discharge status: Home / Self Care | Payer: Medicare Other | Source: Ambulatory Visit | Attending: Internal Medicine | Admitting: Internal Medicine

## 2013-06-26 DIAGNOSIS — R262 Difficulty in walking, not elsewhere classified: Secondary | ICD-10-CM

## 2013-06-26 NOTE — Progress Notes (Signed)
Physical Therapy Treatment Patient Details  Name: [name] MRN: [id number] Date of Birth: July 30, 1933  Today's Date: 06/26/2013 Time: 7829-5621 PT Time Calculation (min): 43 min Charge: Gait 43 [phone]  Visit#: 2 of 8  Re-eval: 07/24/13 Assessment Diagnosis: Generalized weakness Next MD Visit: Dr. Dwana Melena -   Subjective: Symptoms/Limitations Symptoms: Pt stated pain free today. Pain Assessment Currently in Pain?: No/denies  Objective:   Exercise/Treatments Standing Heel Raises: 10 reps;Limitations Heel Raises Limitations: toe raises 10x Knee Flexion: 10 reps Forward Lunges: Limitations;10 reps;Both Forward Lunges Limitations: weight shifting 2 sets 10 reps with big space between feet and heel to toe pattern to simulate walking Gait Training: Gait training with emphasis on increasing stride length to reduce shuffling with visual cuieng     Physical Therapy Assessment and Plan PT Assessment and Plan Clinical Impression Statement: Treatment focus on gait training, improving weight shifting and increasing stride length to reduce shuffling. Pt with imparied coordination with Rt LE requiring multimodal cueing. Visual cueing most effective with landmarks on floor for appropriate stride length. PT Plan: Continue current POC for gait training, improve berg, improve TUG, otoga balance program    Goals    Problem List Patient Active Problem List   Diagnosis Date Noted  . Difficulty in walking(719.7) 06/24/2013    PT - End of Session Equipment Utilized During Treatment: Gait belt Activity Tolerance: Patient tolerated treatment well;Patient limited by fatigue General Behavior During Therapy: [facility] for tasks assessed/performed  GP    Juel Burrow 06/26/2013, 1:51 PM

## 2013-07-01 ENCOUNTER — Ambulatory Visit (HOSPITAL_COMMUNITY)
Admission: RE | Admit: 2013-07-01 | Discharge: 2013-07-01 | Disposition: A | Discharge status: Home / Self Care | Payer: Medicare Other | Source: Ambulatory Visit | Attending: Internal Medicine | Admitting: Internal Medicine

## 2013-07-01 NOTE — Progress Notes (Signed)
Physical Therapy Treatment Patient Details  Name: [name] MRN: [id number] Date of Birth: [birth year]  Today's Date: 07/01/2013 Time: 0930-1018 PT Time Calculation (min): 48 min Visit#: 3 of 8  Re-eval: 07/24/13 Charges:  Gait [phone] (15'), therex [phone] (27')   Subjective: Symptoms/Limitations Symptoms: Pt states she gets really hot/sweats easily.  Pt reports no pain today, however states her legs tremble alot.  Exercise/Treatments Aerobic Stationary Bike: NuStep seat 6, 10' level 2 level surfaces Standing Heel Raises: 10 reps;Limitations Heel Raises Limitations: toe raises 10x Knee Flexion: 10 reps Forward Lunges: Limitations;10 reps;Both Forward Lunges Limitations: weight shifting 2 sets 10 reps with big space between feet and heel to toe pattern to simulate walking Gait Training: Gait training with emphasis on increasing stride length to reduce shuffling with visual cuieng Other Standing Knee Exercises: retro gait, side stepping 1RT with BUE assist Other Standing Knee Exercises: Marching with UE assist, alternating 10 reps Seated Other Seated Knee Exercises: Marching 10 reps alternating      Physical Therapy Assessment and Plan PT Assessment and Plan Clinical Impression Statement: Continued focus on decreasing gait speed and increasing stride with ambulation.  Added nustep today as well as balance activities.  PT required UE assist to complete balance activities.  Constant verbal cues needed to slow gait and increase stride. PT Plan: Continue current POC for gait training, improve berg, improve TUG, otoga balance program     Problem List Patient Active Problem List   Diagnosis Date Noted  . Difficulty in walking(719.7) 06/24/2013    PT - End of Session Equipment Utilized During Treatment: Gait belt Activity Tolerance: Patient tolerated treatment well;Patient limited by fatigue General Behavior During Therapy: [facility] for tasks assessed/performed   Lurena Nida, PTA/CLT 07/01/2013, 10:25 AM

## 2013-07-03 ENCOUNTER — Ambulatory Visit (HOSPITAL_COMMUNITY)
Admission: RE | Admit: 2013-07-03 | Discharge: 2013-07-03 | Disposition: A | Discharge status: Home / Self Care | Payer: Medicare Other | Source: Ambulatory Visit | Attending: Internal Medicine | Admitting: Internal Medicine

## 2013-07-03 DIAGNOSIS — R262 Difficulty in walking, not elsewhere classified: Secondary | ICD-10-CM

## 2013-07-03 NOTE — Progress Notes (Signed)
Physical Therapy Treatment Patient Details  Name: [name] MRN: [id number] Date of Birth: 1933/10/17  Today's Date: 07/03/2013 Time: 4098-1191 PT Time Calculation (min): 45 min Charge: Gait 23' 0933-0956, Therex 22' 0956-1018  Visit#: 4 of 8  Re-eval: 07/24/13 Assessment Diagnosis: Generalized weakness Next MD Visit: Dr. Dwana Melena -   Subjective: Symptoms/Limitations Symptoms: Pt reports working on taking big staps at home, always starts with Lt LE first.  Pt stated she is really hot today.   No reports of pain today. Pain Assessment Currently in Pain?: No/denies  Objective:   Exercise/Treatments Standing Heel Raises: 10 reps;Limitations Heel Raises Limitations: toe raises 10x Knee Flexion: Both;15 reps;Limitations Knee Flexion Limitations: 3# 5" holds Forward Lunges: Limitations;10 reps;Both Forward Lunges Limitations: weight shifting 2 sets 10 reps with big space between feet and heel to toe pattern to simulate walking Gait Training: Gait training with emphasis on increasing stride length to reduce shuffling with visual cuieng Other Standing Knee Exercises: retro gait, side stepping 1RT with BUE assist Other Standing Knee Exercises: Marching with UE assist, alternating 10 reps x 5" holds Seated Long Arc Quad: Both;10 reps;Limitations Long Arc Quad Limitations: 5" holds  Physical Therapy Assessment and Plan PT Assessment and Plan Clinical Impression Statement: Gait training to imporve mechanics with focus decreaseing gait speed and increasing stride length especially with Lt LE.  Visual cueing with tape on ground helped to improve prioprioception with constant verbal cueing to improve gait mechanics. PT Plan: Continue current POC for gait training, improve berg, improve TUG, otoga balance program    Goals    Problem List Patient Active Problem List   Diagnosis Date Noted  . Difficulty in walking(719.7) 06/24/2013    PT - End of Session Activity  Tolerance: Patient tolerated treatment well;Patient limited by fatigue General Behavior During Therapy: [facility] for tasks assessed/performed  GP    Juel Burrow 07/03/2013, 11:20 AM

## 2013-07-08 ENCOUNTER — Ambulatory Visit (HOSPITAL_COMMUNITY)
Admission: RE | Admit: 2013-07-08 | Discharge: 2013-07-08 | Disposition: A | Discharge status: Home / Self Care | Payer: Medicare Other | Source: Ambulatory Visit | Attending: Internal Medicine | Admitting: Internal Medicine

## 2013-07-08 DIAGNOSIS — R262 Difficulty in walking, not elsewhere classified: Secondary | ICD-10-CM

## 2013-07-08 NOTE — Progress Notes (Signed)
Physical Therapy Treatment Patient Details  Name: [name] MRN: [id number] Date of Birth: 1933-09-26  Today's Date: 07/08/2013 Time: [phone] PT Time Calculation (min): 45 min Charges: NMR: [phone] Visit#: 5 of 8  Re-eval: 07/24/13    Authorization:    Authorization Time Period:    Authorization Visit#: 5 of 10   Subjective: Symptoms/Limitations Symptoms: Pt reports that she is trying to work on her big steps.  Walking independently through hallways.   Precautions/Restrictions     Exercise/Treatments Neuromuscular Re-education Standing Gait Training: w/slat board and solid surface with PT max VC and TC and visual cueing for proper technique w/mod A using gait belt x45 minutes w/5 rest breaks Standing Eyes Opened: Narrow base of support (BOS);Solid surface;Time;3 reps;Limitations Standing Eyes Opened Time: 1-2 minutes with maximal perbations Standing Eyes Closed: Narrow base of support (BOS);3 reps;30 secs Tandem Stance: 3 reps;30 secs (BLE)   Physical Therapy Assessment and Plan PT Assessment and Plan Clinical Impression Statement: utilized slat board for improved visual cueing.  Pt able to verbalaize back "big steps" and "marching" as key visual cues when ambulating.  Pt continues to have improvement with RLE and is most effected by LLE.  Pt maintains independentl balance against maximal pertabations in all directions with static standing.  Demonstrates greatest deficits with narrow walkways.  PT Plan: Continue current POC for gait training, improve berg, improve TUG, otoga balance program    Goals    Problem List Patient Active Problem List   Diagnosis Date Noted  . Difficulty in walking(719.7) 06/24/2013    PT - End of Session Activity Tolerance: Patient tolerated treatment well;Patient limited by fatigue General Behavior During Therapy: [facility] for tasks assessed/performed  GP    Roarke Marciano, MPT, ATC 07/08/2013, 1:00 PM

## 2013-07-10 ENCOUNTER — Ambulatory Visit (HOSPITAL_COMMUNITY)
Admission: RE | Admit: 2013-07-10 | Discharge: 2013-07-10 | Disposition: A | Discharge status: Home / Self Care | Payer: Medicare Other | Source: Ambulatory Visit | Attending: Internal Medicine | Admitting: Internal Medicine

## 2013-07-10 NOTE — Progress Notes (Signed)
Physical Therapy Treatment Patient Details  Name: [name] MRN: [id number] Date of Birth: 28-Feb-1933  Today's Date: 07/10/2013 Time: 4098-1191 PT Time Calculation (min): 46 min Visit#: 6 of 8  Re-eval: 07/24/13 Charges:  Gait [phone] (34'), therex [phone]  (10')   Subjective: No pain reported today     Exercise/Treatments Standing Gait Training: w/slat board and solid surface with PT max VC and TC and visual cueing for proper technique w/mod A using gait belt x 6 RT one rest break Other Standing Knee Exercises: marching 10 reps, gait with SPC, working on turning/changing directions Seated Other Seated Knee Exercises: sit to stand 5 reps without UE assist regular chair     Physical Therapy Assessment and Plan PT Assessment and Plan Clinical Impression Statement: Continued use of slat/agility board for improving knee flexion with ambulation and increasing stride.  Pt with overall improved gait needing less cues; daughter happy with progress.  Began gait training with SPC per daughter request/feels RW is unsafte as it rolls away from patient at times. PT Plan: Continue current POC for gait training, improve berg, improve TUG, otoga balance program; Re-[place] X 2 more visits     Problem List Patient Active Problem List   Diagnosis Date Noted  . Difficulty in walking(719.7) 06/24/2013    PT - End of Session Activity Tolerance: Patient tolerated treatment well;Patient limited by fatigue General Behavior During Therapy: [facility] for tasks assessed/performed   Lurena Nida, PTA/CLT 07/10/2013, 10:24 AM

## 2013-07-13 ENCOUNTER — Ambulatory Visit (HOSPITAL_COMMUNITY)
Admission: RE | Admit: 2013-07-13 | Discharge: 2013-07-13 | Disposition: A | Discharge status: Home / Self Care | Payer: Medicare Other | Source: Ambulatory Visit | Attending: Internal Medicine | Admitting: Internal Medicine

## 2013-07-13 DIAGNOSIS — R262 Difficulty in walking, not elsewhere classified: Secondary | ICD-10-CM

## 2013-07-13 NOTE — Progress Notes (Signed)
Physical Therapy Treatment Patient Details  Name: [name] MRN: [id number] Date of Birth: 26-Oct-1933  Today's Date: 07/13/2013 Time: 1308-6578 PT Time Calculation (min): 45 min Charges: NMR: [phone] Visit#: 7 of 8  Re-eval: 07/24/13    Authorization:    Authorization Time Period:    Authorization Visit#: 7 of 10   Subjective: Symptoms/Limitations Symptoms: Pt reports trying to use a standard walker and her legs are sore today.  Pain Assessment Currently in Pain?: Yes Pain Location: Leg Pain Orientation: Right;Left  Precautions/Restrictions     Exercise/Treatments Mobility/Balance        Balance Exercises Standing Standing Eyes Opened: Narrow base of support (BOS);2 reps;30 secs;1 rep;Head turns;Solid surface Tandem Stance: 3 reps;30 secs SLS: Eyes open;Solid surface;Hand held assist (HHA) 2;Limitations SLS Limitations: 5 sec holds 10 reps alternating Sidestepping: 1 rep;Limitations Sidestepping Limitations: mod A with max cueing for LE placement and decrease speed.  Turning: Right;Left;3 reps;Limitations Turning Limitations: with 5 cones each direction Marching: Solid surface;20 reps;Limitations Marching Limitations: mod A Other Standing Exercises: Big steps with reciprocal arm movements  Yoga Poses    Seated Other Seated Exercises: alternating arms and legs Other Seated Exercises: NuStep: Hills #2, 8 minutes reistance 4 to improve strength and promote reciprocal movement  Sit to stand x10 from standard surface Supine       Physical Therapy Assessment and Plan PT Assessment and Plan Clinical Impression Statement: Pt continues to demonstrates greatest cueing for LLE.  Pt continues to improve her overall stride length.  Today focused on improving her rotation and turning.  Pt able to verbalize correct foot movement with walking and turning.  Demonstrates difficulty with coordinating the approprirate movements.  PT Plan: Re-eval next vist.  Pt likely  wants to d/c from PT due to finacial concerns.  Complete Berg, TUG     Goals Home Exercise Program PT Goal: Perform Home Exercise Program - Progress: Met PT Short Term Goals Time to Complete Short Term Goals: 2 weeks PT Short Term Goal 1: Pt will improve her activity tolerance and demonstrate ambulation x8 minutes with RW without sitting rest breaks.  PT Short Term Goal 1 - Progress: Met PT Short Term Goal 2: Pt will improve her LE coordination with improved gait mechanics to decrease energy expenditure.  PT Short Term Goal 2 - Progress: Progressing toward goal PT Long Term Goals Time to Complete Long Term Goals:  (6 weeks) PT Long Term Goal 1: Pt will improve her TUG to less than 20 seconds for improved safety in the community.  PT Long Term Goal 1 - Progress: Progressing toward goal PT Long Term Goal 2: Pt will improve her berg balance test to greater than 45/56 to decrease risk of falls.  PT Long Term Goal 2 - Progress: Progressing toward goal Long Term Goal 3: Pt will improve her activity tolerance in order to ambulate for greater than 15 minutes with her RW in order to safely attend dinner with her family.  Long Term Goal 3 Progress: Progressing toward goal  Problem List Patient Active Problem List   Diagnosis Date Noted  . Difficulty in walking(719.7) 06/24/2013    PT - End of Session Activity Tolerance: Patient tolerated treatment well;Patient limited by fatigue General Behavior During Therapy: [facility] for tasks assessed/performed  GP    Natalia Wittmeyer, MPT, ATC 07/13/2013, 9:30 AM

## 2013-07-15 ENCOUNTER — Ambulatory Visit (HOSPITAL_COMMUNITY): Payer: Medicare Other

## 2013-07-17 ENCOUNTER — Ambulatory Visit (HOSPITAL_COMMUNITY)
Admission: RE | Admit: 2013-07-17 | Discharge: 2013-07-17 | Disposition: A | Discharge status: Home / Self Care | Payer: Medicare Other | Source: Ambulatory Visit | Attending: Internal Medicine | Admitting: Internal Medicine

## 2013-07-17 DIAGNOSIS — IMO0001 Reserved for inherently not codable concepts without codable children: Secondary | ICD-10-CM | POA: Insufficient documentation

## 2013-07-17 DIAGNOSIS — E119 Type 2 diabetes mellitus without complications: Secondary | ICD-10-CM | POA: Insufficient documentation

## 2013-07-17 DIAGNOSIS — R5381 Other malaise: Secondary | ICD-10-CM | POA: Insufficient documentation

## 2013-07-17 DIAGNOSIS — R5383 Other fatigue: Secondary | ICD-10-CM | POA: Insufficient documentation

## 2013-07-17 DIAGNOSIS — R262 Difficulty in walking, not elsewhere classified: Secondary | ICD-10-CM

## 2013-07-17 NOTE — Progress Notes (Signed)
Physical Therapy Re-evaluation/treatment  Patient Details  Name: [name] MRN: [id number] Date of Birth: 1933/10/16  Today's Date: 07/17/2013 Time: 0935-1010 PT Time Calculation (min): 35 min Charge : Gait [phone], Physical performance testings (TUG and BERG) 0950-1010              Visit#: 8 of 10  Re-eval: 07/31/13 Assessment Diagnosis: Generalized weakness Next MD Visit: Dr. Dwana Melena -   Authorization:      Authorization Time Period:    Authorization Visit#: 8 of 10   Subjective Symptoms/Limitations Symptoms: Family stated pt walking outdoors in yard and has been swinging arms.   Pain Assessment Currently in Pain?: No/denies  Exercise/Treatments Mobility/Balance  Berg Balance Test Sit to Stand: Able to stand  independently using hands Standing Unsupported: Able to stand 2 minutes with supervision Sitting with Back Unsupported but Feet Supported on Floor or Stool: Able to sit safely and securely 2 minutes Stand to Sit: Controls descent by using hands Transfers: Able to transfer with verbal cueing and /or supervision Standing Unsupported with Eyes Closed: Able to stand 10 seconds with supervision Standing Ubsupported with Feet Together: Needs help to attain position but able to stand for 30 seconds with feet together From Standing, Reach Forward with Outstretched Arm: Can reach forward >5 cm safely (2") From Standing Position, Pick up Object from Floor: Able to pick up shoe, needs supervision From Standing Position, Turn to Look Behind Over each Shoulder: Turn sideways only but maintains balance Turn 360 Degrees: Needs close supervision or verbal cueing Standing Unsupported, Alternately Place Feet on Step/Stool: Needs assistance to keep from falling or unable to try Standing Unsupported, One Foot in Front: Loses balance while stepping or standing Standing on One Leg: Unable to try or needs assist to prevent fall Total Score: 27 was 27 Timed Up and Go Test TUG:  Normal TUG Normal TUG (seconds): 31 was 53 seconds with risk of fall increased with anything above 14 seconds.  Standing Gait Training: Gait training x 263 ft with focus on increasing stride length and turns Other Standing Knee Exercises: standing following falls techniques  Physical Therapy Assessment and Plan PT Assessment and Plan Clinical Impression Statement: TUG and BERG complete to assess pt.'s improved gait mechanics and balance.  Pt fell during TUG test, fall report has been complete.  Pt offered to go to ER as well as Ice for Lt knee pain following fall.  Pt instructed techniques to assist with standing after falls.  Pt with improved gait speed noted with TUG, coninues to requre cueing to increase stride length for more normalized gait mechanics.  Pt given number for financial assistance to help with costs for continuing OPPT.   PT Frequency: Min 1X/week PT Duration:  (2 weeks) PT Plan: Following discussion with pt. decision made to continue OPPT 1x week for 2 more weeks due to financial concerns.  Focus on improving gait mechanics, balance training, next session begin standing from floor to assist with recovery following falls.      Goals Home Exercise Program PT Goal: Perform Home Exercise Program - Progress: Met PT Short Term Goals Time to Complete Short Term Goals: 2 weeks PT Short Term Goal 1: Pt will improve her activity tolerance and demonstrate ambulation x8 minutes with RW without sitting rest breaks.  PT Short Term Goal 1 - Progress: Met PT Short Term Goal 2: Pt will improve her LE coordination with improved gait mechanics to decrease energy expenditure.  PT Short Term Goal 2 -  Progress: Progressing toward goal PT Long Term Goals Time to Complete Long Term Goals:  (6 weeks) PT Long Term Goal 1: Pt will improve her TUG to less than 20 seconds for improved safety in the community.  PT Long Term Goal 1 - Progress: Progressing toward goal (31" was 53") PT Long Term Goal 2:  Pt will improve her berg balance test to greater than 45/56 to decrease risk of falls.  PT Long Term Goal 2 - Progress: Progressing toward goal Long Term Goal 3: Pt will improve her activity tolerance in order to ambulate for greater than 15 minutes with her RW in order to safely attend dinner with her family.  Long Term Goal 3 Progress: Progressing toward goal  Problem List Patient Active Problem List   Diagnosis Date Noted  . Difficulty in walking(719.7) 06/24/2013    PT - End of Session Equipment Utilized During Treatment: Gait belt Activity Tolerance: Patient tolerated treatment well General Behavior During Therapy: [facility] for tasks assessed/performed  GP    Juel Burrow 07/17/2013, 10:52 AM

## 2013-07-22 ENCOUNTER — Ambulatory Visit (HOSPITAL_COMMUNITY)
Admission: RE | Admit: 2013-07-22 | Discharge: 2013-07-22 | Disposition: A | Discharge status: Home / Self Care | Payer: Medicare Other | Source: Ambulatory Visit

## 2013-07-22 DIAGNOSIS — R262 Difficulty in walking, not elsewhere classified: Secondary | ICD-10-CM

## 2013-07-22 NOTE — Progress Notes (Signed)
Physical Therapy Treatment Patient Details  Name: [name] MRN: [id number] Date of Birth: 1933/05/23  Today's Date: 07/22/2013 Time: 7829-5621 PT Time Calculation (min): 53 min Charge: Gait 23' 0846-0909, Self care x 15' 0910-0925, TE 13' 0925-0938  Visit#: 9 of 10  Re-eval: 07/31/13 Assessment Diagnosis: Generalized weakness Next MD Visit: Dr. Dwana Melena -   Authorization:    Authorization Time Period:    Authorization Visit#: 9 of 10   Subjective: Symptoms/Limitations Symptoms: Pt stated Lt knee was sore today, no real pain. Pain Assessment Currently in Pain?: Yes (soreness) Pain Score: 2  Pain Location: Knee Pain Orientation: Left  Objective:   Exercise/Treatments Standing Functional Squat: 10 reps;3 seconds Gait Training: Gait training x 23 minutes with focus on reducing shuffling utilized slate board, big steps with corners  Other Standing Knee Exercises: floor to standing fall recovery technique Seated Other Seated Knee Exercises: sit to stand 10 reps without UE assist regular chair   Balance Exercises Standing Other Standing Exercises: Big steps with reciprocal arm movements      Physical Therapy Assessment and Plan PT Assessment and Plan Clinical Impression Statement: Session focus on fall recovery and improving gait mechanics to reduce risk of falls.  Pt with improved gait mechanics following cueing to stop, slow down, and to increase stride length as well as UE movements to assist wtih balance.  Pt most diffiiculty with corners, increased cueing required to increase stride length wtih Lt LE.  PT Plan: Continue OPPT x 1 more session.  Focus on improving gait mechanics and balance training.    Goals    Problem List Patient Active Problem List   Diagnosis Date Noted  . Difficulty in walking(719.7) 06/24/2013    PT - End of Session Equipment Utilized During Treatment: Gait belt Activity Tolerance: Patient tolerated treatment  well General Behavior During Therapy: [facility] for tasks assessed/performed  GP    Juel Burrow 07/22/2013, 12:05 PM

## 2013-07-26 ENCOUNTER — Encounter: Payer: Self-pay | Admitting: *Deleted

## 2013-07-29 ENCOUNTER — Encounter: Payer: Self-pay | Admitting: Cardiovascular Disease

## 2013-07-30 ENCOUNTER — Ambulatory Visit: Payer: Medicare Other | Admitting: Cardiovascular Disease

## 2013-08-14 ENCOUNTER — Ambulatory Visit: Payer: Medicare Other | Admitting: Cardiology

## 2013-09-03 ENCOUNTER — Ambulatory Visit: Payer: Medicare Other | Admitting: Cardiology

## 2013-09-10 ENCOUNTER — Encounter: Payer: Self-pay | Admitting: *Deleted

## 2013-09-11 ENCOUNTER — Ambulatory Visit (INDEPENDENT_AMBULATORY_CARE_PROVIDER_SITE_OTHER): Payer: Medicare Other | Admitting: Cardiology

## 2013-09-11 ENCOUNTER — Encounter: Payer: Self-pay | Admitting: Cardiology

## 2013-09-11 VITALS — BP 133/70 | HR 99 | Ht 63.0 in | Wt 163.1 lb

## 2013-09-11 DIAGNOSIS — E782 Mixed hyperlipidemia: Secondary | ICD-10-CM

## 2013-09-11 DIAGNOSIS — E1159 Type 2 diabetes mellitus with other circulatory complications: Secondary | ICD-10-CM

## 2013-09-11 DIAGNOSIS — I6529 Occlusion and stenosis of unspecified carotid artery: Secondary | ICD-10-CM | POA: Insufficient documentation

## 2013-09-11 DIAGNOSIS — E119 Type 2 diabetes mellitus without complications: Secondary | ICD-10-CM

## 2013-09-11 DIAGNOSIS — I739 Peripheral vascular disease, unspecified: Secondary | ICD-10-CM

## 2013-09-11 DIAGNOSIS — I2510 Atherosclerotic heart disease of native coronary artery without angina pectoris: Secondary | ICD-10-CM

## 2013-09-11 NOTE — Progress Notes (Signed)
Clinical Summary [name] is a 79 y.o.female presenting to establish cardiology followup. She is a former [facility] patient, last seen by Dr.Croitoru in in June 2013. Records reviewed.  She reports occasional angina symptoms, has used nitroglycerin only a few times in the last several months. She seems to be very limited functionally, uses a rolling walker, shuffles her feet. She also describes a  discoloration of her feet which has been chronic, left worse than right, also similar symptoms in her left hand. She does not describe pain in her left arm with use or necessarily claudication, but her legs get weak when she walks.  Echocardiogram from April 2009 revealed mild LVH with LVEF greater than 55%, diastolic dysfunction, MAC with trace mitral regurgitation, trace tricuspid regurgitation, PASP 37 mmHg. Carotid Dopplers from April 2009 revealed 0-49% LICA and RICA stenoses. ABIs from February 2005 were normal.   Most recent ischemic evaluation was via Persantine Myoview in June 2012. This study revealed evidence of mild basal inferior and inferolateral ischemia with LVEF 65%, overall low risk.  Lipid panel in October 2013 showed cholesterol 183, triglycerides 557, HDL 30, and LDL not calculated. She states that she has had followup lab work with Dr. Margo Aye since then, and remains on a stable regimen.   Allergies  Allergen Reactions  . Codeine Other (See Comments)    Made teeth loose  . Penicillins Rash  . Sulfa Antibiotics Rash    Current Outpatient Prescriptions  Medication Sig Dispense Refill  . aspirin EC 81 MG tablet Take 81 mg by mouth daily.      . Choline Fenofibrate 135 MG capsule Take 135 mg by mouth daily.      Marland Kitchen latanoprost (XALATAN) 0.005 % ophthalmic solution Place 1 drop into both eyes at bedtime.      . Milnacipran (SAVELLA) 50 MG TABS Take 50 mg by mouth 2 (two) times daily.      . Multiple Vitamins-Minerals (MULTIVITAMIN WITH MINERALS) tablet Take 1 tablet by mouth  daily.      . pantoprazole (PROTONIX) 40 MG tablet Take 40 mg by mouth daily.       . rosuvastatin (CRESTOR) 20 MG tablet Take 20 mg by mouth daily.      . sitaGLIPtan-metformin (JANUMET) 50-1000 MG per tablet Take 1 tablet by mouth 2 (two) times daily with a meal.       No current facility-administered medications for this visit.    Past Medical History  Diagnosis Date  . Type 2 diabetes mellitus   . Hyperlipidemia   . Spinal stenosis   . Depression   . Glaucoma   . Nephrolithiasis   . Hydrocephalus   . Coronary atherosclerosis of native coronary artery     Multivessel status post CABG    Past Surgical History  Procedure Laterality Date  . Back surgery    . Kyphoplasty    . Foot surgery    . Ventriculoperitoneal shunt    . Appendectomy    . Coronary artery bypass graft  11/24/1991    SVG to OM1, SVG to RCA    Family History  Problem Relation Age of Onset  . Cancer Father     Social History [name] reports that she has never smoked. She does not have any smokeless tobacco history on file. [name] reports that she does not drink alcohol.  Review of Systems No palpitations, dizziness, syncope. Stable appetite. No recent falls. Otherwise as outlined above.  Physical Examination Filed Vitals:  09/11/13 0948  BP: 133/70  Pulse: 99   Filed Weights   09/11/13 0948  Weight: 163 lb 1.3 oz (73.973 kg)   Overweight, chronically ill-appearing woman. /HEENT: Conjunctiva and lids normal, oropharynx clear. Neck: Supple, no elevated JVP, soft carotid bruits, no thyromegaly. Lungs: Clear to auscultation, nonlabored breathing at rest. Cardiac: Regular rate and rhythm, no S3 or significant systolic murmur, no pericardial rub. Possible left subclavian bruit. Abdomen: Soft, nontender, bowel sounds present, no guarding or rebound. Extremities: Trace edema and venous stasis, distal pulses 1+. Skin: Warm and dry. Musculoskeletal: No kyphosis. Neuropsychiatric: Alert  and oriented x3, affect grossly appropriate.   Problem List and Plan   Coronary atherosclerosis of native coronary artery Patient reports stable, occasional angina symptoms. Refill given for nitroglycerin. Stress testing in the last 2 years was overall low risk, some inferior ischemia noted. We discussed these issues, certainly if she has progressive symptoms, we will consider followup testing. Followup arranged in 6 months.  Carotid artery occlusion without infarction Last evaluation was in 2009. She has soft bruits, possible left subclavian bruit. We will obtain followup Doppler studies for assessment.  Mixed hyperlipidemia Followed most recently by Dr. Margo Aye. She continues on Crestor.  Type 2 diabetes mellitus Followed by Dr. Margo Aye.    Jonelle Sidle, M.D., F.A.C.C.

## 2013-09-11 NOTE — Assessment & Plan Note (Signed)
Followed most recently by Dr. Margo Aye. She continues on Crestor.

## 2013-09-11 NOTE — Assessment & Plan Note (Signed)
Last evaluation was in 2009. She has soft bruits, possible left subclavian bruit. We will obtain followup Doppler studies for assessment.

## 2013-09-11 NOTE — Addendum Note (Signed)
Addended by: Thompson Grayer on: 09/11/2013 10:37 AM   Modules accepted: Orders

## 2013-09-11 NOTE — Assessment & Plan Note (Signed)
Followed by Dr Hall 

## 2013-09-11 NOTE — Patient Instructions (Signed)
Your physician recommends that you schedule a follow-up appointment in: 6 months. You will receive a reminder letter two months in advance reminding you to call and schedule your appointment. If you don't receive this letter, please contact our office.  Your physician has requested that you have a lower extremity arterial duplex to be done with ABI's. ultrasound of the arteries in the legs or arms. It looks at arterial blood flow in the legs and arms. Allow one hour for Lower and Upper Arterial scans. There are no restrictions or special instructions   Your physician has requested that you have a carotid duplex to check left subclavian. This test is an ultrasound of the carotid arteries in your neck. It looks at blood flow through these arteries that supply the brain with blood. Allow one hour for this exam. There are no restrictions or special instructions.

## 2013-09-11 NOTE — Assessment & Plan Note (Signed)
Patient reports stable, occasional angina symptoms. Refill given for nitroglycerin. Stress testing in the last 2 years was overall low risk, some inferior ischemia noted. We discussed these issues, certainly if she has progressive symptoms, we will consider followup testing. Followup arranged in 6 months.

## 2013-09-18 ENCOUNTER — Telehealth: Payer: Self-pay | Admitting: *Deleted

## 2013-09-18 MED ORDER — NITROGLYCERIN 0.4 MG SL SUBL: 0.4000 mg | SUBLINGUAL_TABLET | SUBLINGUAL | Status: DC | PRN

## 2013-09-18 NOTE — Telephone Encounter (Signed)
Pt states that [facility] on [address] never received a RX for nitro.

## 2013-09-18 NOTE — Telephone Encounter (Signed)
rx sent to pharmacy by e-script  

## 2013-09-22 ENCOUNTER — Encounter (HOSPITAL_COMMUNITY): Payer: Medicare Other

## 2013-09-25 ENCOUNTER — Ambulatory Visit (HOSPITAL_COMMUNITY): Payer: Medicare Other | Attending: Cardiology

## 2013-09-25 ENCOUNTER — Ambulatory Visit (HOSPITAL_BASED_OUTPATIENT_CLINIC_OR_DEPARTMENT_OTHER): Payer: Medicare Other

## 2013-09-25 DIAGNOSIS — Z87891 Personal history of nicotine dependence: Secondary | ICD-10-CM | POA: Insufficient documentation

## 2013-09-25 DIAGNOSIS — E1059 Type 1 diabetes mellitus with other circulatory complications: Secondary | ICD-10-CM

## 2013-09-25 DIAGNOSIS — Z951 Presence of aortocoronary bypass graft: Secondary | ICD-10-CM | POA: Insufficient documentation

## 2013-09-25 DIAGNOSIS — I658 Occlusion and stenosis of other precerebral arteries: Secondary | ICD-10-CM | POA: Insufficient documentation

## 2013-09-25 DIAGNOSIS — E785 Hyperlipidemia, unspecified: Secondary | ICD-10-CM | POA: Insufficient documentation

## 2013-09-25 DIAGNOSIS — I739 Peripheral vascular disease, unspecified: Secondary | ICD-10-CM

## 2013-09-25 DIAGNOSIS — E1159 Type 2 diabetes mellitus with other circulatory complications: Secondary | ICD-10-CM

## 2013-09-25 DIAGNOSIS — E119 Type 2 diabetes mellitus without complications: Secondary | ICD-10-CM | POA: Insufficient documentation

## 2013-09-25 DIAGNOSIS — I6529 Occlusion and stenosis of unspecified carotid artery: Secondary | ICD-10-CM | POA: Insufficient documentation

## 2013-09-25 DIAGNOSIS — I2510 Atherosclerotic heart disease of native coronary artery without angina pectoris: Secondary | ICD-10-CM | POA: Insufficient documentation

## 2014-01-11 ENCOUNTER — Other Ambulatory Visit: Payer: Self-pay | Admitting: Adult Health

## 2014-01-11 DIAGNOSIS — Z139 Encounter for screening, unspecified: Secondary | ICD-10-CM

## 2014-01-18 ENCOUNTER — Ambulatory Visit (HOSPITAL_COMMUNITY)
Admission: RE | Admit: 2014-01-18 | Discharge: 2014-01-18 | Disposition: A | Discharge status: Home / Self Care | Payer: Medicare Other | Source: Ambulatory Visit | Attending: Adult Health | Admitting: Adult Health

## 2014-01-18 DIAGNOSIS — Z1231 Encounter for screening mammogram for malignant neoplasm of breast: Secondary | ICD-10-CM | POA: Insufficient documentation

## 2014-01-18 DIAGNOSIS — Z139 Encounter for screening, unspecified: Secondary | ICD-10-CM

## 2014-02-03 ENCOUNTER — Other Ambulatory Visit: Payer: Self-pay | Admitting: Adult Health

## 2014-03-24 ENCOUNTER — Encounter: Payer: Self-pay | Admitting: Cardiology

## 2014-03-24 ENCOUNTER — Ambulatory Visit (INDEPENDENT_AMBULATORY_CARE_PROVIDER_SITE_OTHER): Payer: Medicare Other | Admitting: Cardiology

## 2014-03-24 VITALS — BP 151/76 | HR 99 | Ht 63.5 in | Wt 160.1 lb

## 2014-03-24 DIAGNOSIS — I6529 Occlusion and stenosis of unspecified carotid artery: Secondary | ICD-10-CM

## 2014-03-24 DIAGNOSIS — E782 Mixed hyperlipidemia: Secondary | ICD-10-CM

## 2014-03-24 DIAGNOSIS — I2510 Atherosclerotic heart disease of native coronary artery without angina pectoris: Secondary | ICD-10-CM

## 2014-03-24 NOTE — Patient Instructions (Signed)
Your physician recommends that you schedule a follow-up appointment in: 6 months with Dr. Domenic Polite. You should receive a letter in the mail in 4 months. If you do not receive this letter by August 2015 call our office to schedule this appointment.   Your physician recommends that you continue on your current medications as directed. Please refer to the Current Medication list given to you today.

## 2014-03-24 NOTE — Assessment & Plan Note (Signed)
Symptomatically stable, continue medical therapy and observation.

## 2014-03-24 NOTE — Progress Notes (Signed)
Clinical Summary [name] is a 80 y.o.female last seen in September 2014. She is here with her daughter, overall doing well from a cardiac perspective. No progressive angina symptoms or nitroglycerin use. She will be seeing Dr. Nevada Crane for routine visit later this week for review of lab work. Main complaint is that she is having difficulty sleeping.  Most recent ischemic evaluation was via Persantine Myoview in June 2012. This study revealed evidence of mild basal inferior and inferolateral ischemia with LVEF 65%, overall low risk. Echocardiogram from April 2009 revealed mild LVH with LVEF greater than 21%, diastolic dysfunction, MAC with trace mitral regurgitation, trace tricuspid regurgitation, PASP 37 mmHg.  Recent followup carotid Dopplers indicated 0-39% bilateral ICA stenoses.   Allergies  Allergen Reactions  . Codeine Other (See Comments)    Made teeth loose  . Penicillins Rash  . Sulfa Antibiotics Rash    Current Outpatient Prescriptions  Medication Sig Dispense Refill  . aspirin EC 81 MG tablet Take 81 mg by mouth daily.      . Choline Fenofibrate 135 MG capsule Take 135 mg by mouth daily.      Marland Kitchen latanoprost (XALATAN) 0.005 % ophthalmic solution Place 1 drop into both eyes at bedtime.      . Milnacipran (SAVELLA) 50 MG TABS Take 50 mg by mouth 2 (two) times daily.      . Multiple Vitamins-Minerals (MULTIVITAMIN WITH MINERALS) tablet Take 1 tablet by mouth daily.      . nitroGLYCERIN (NITROSTAT) 0.4 MG SL tablet Place 1 tablet (0.4 mg total) under the tongue every 5 (five) minutes as needed for chest pain.  25 tablet  5  . pantoprazole (PROTONIX) 40 MG tablet Take 40 mg by mouth daily.       . rosuvastatin (CRESTOR) 20 MG tablet Take 20 mg by mouth daily.      . sitaGLIPtan-metformin (JANUMET) 50-1000 MG per tablet Take 1 tablet by mouth 2 (two) times daily with a meal.       No current facility-administered medications for this visit.    Past Medical History    Diagnosis Date  . Type 2 diabetes mellitus   . Hyperlipidemia   . Spinal stenosis   . Depression   . Glaucoma   . Nephrolithiasis   . Hydrocephalus   . Coronary atherosclerosis of native coronary artery     Multivessel status post CABG    Past Surgical History  Procedure Laterality Date  . Back surgery    . Kyphoplasty    . Foot surgery    . Ventriculoperitoneal shunt    . Appendectomy    . Coronary artery bypass graft  11/24/1991    SVG to OM1, SVG to RCA    Social History [name] reports that she has never smoked. She does not have any smokeless tobacco history on file. [name] reports that she does not drink alcohol.  Review of Systems No palpitations, no falls, uses a walker to ambulate. Otherwise negative except as outlined.  Physical Examination Filed Vitals:   03/24/14 1032  BP: 151/76  Pulse: 99   Filed Weights   03/24/14 1032  Weight: 160 lb 1.9 oz (72.63 kg)    Overweight, chronically ill-appearing woman.  /HEENT: Conjunctiva and lids normal, oropharynx clear.  Neck: Supple, no elevated JVP, soft carotid bruits, no thyromegaly.  Lungs: Clear to auscultation, nonlabored breathing at rest.  Cardiac: Regular rate and rhythm, no S3 or significant systolic murmur, no pericardial rub. Possible  left subclavian bruit.  Abdomen: Soft, nontender, bowel sounds present, no guarding or rebound.  Extremities: Trace edema and venous stasis, distal pulses 1+.    Problem List and Plan   Coronary atherosclerosis of native coronary artery Symptomatically stable, continue medical therapy and observation.  Mixed hyperlipidemia Continues on Crestor, followup lab work in review with Dr. Nevada Crane this week.  Carotid artery occlusion without infarction Mild bilateral ICA stenoses by recent followup carotid Dopplers.    Satira Sark, M.D., F.A.C.C.

## 2014-03-24 NOTE — Assessment & Plan Note (Signed)
Mild bilateral ICA stenoses by recent followup carotid Dopplers.

## 2014-03-24 NOTE — Assessment & Plan Note (Signed)
Continues on Crestor, followup lab work in review with Dr. Nevada Crane this week.

## 2014-10-15 ENCOUNTER — Ambulatory Visit (INDEPENDENT_AMBULATORY_CARE_PROVIDER_SITE_OTHER): Payer: Medicare Other | Admitting: Cardiology

## 2014-10-15 ENCOUNTER — Encounter: Payer: Self-pay | Admitting: Cardiology

## 2014-10-15 VITALS — BP 116/70 | HR 95 | Ht 63.0 in | Wt 171.0 lb

## 2014-10-15 DIAGNOSIS — E782 Mixed hyperlipidemia: Secondary | ICD-10-CM

## 2014-10-15 DIAGNOSIS — I2510 Atherosclerotic heart disease of native coronary artery without angina pectoris: Secondary | ICD-10-CM

## 2014-10-15 MED ORDER — NITROGLYCERIN 0.4 MG SL SUBL: 0.4000 mg | SUBLINGUAL_TABLET | SUBLINGUAL | Status: DC | PRN

## 2014-10-15 NOTE — Progress Notes (Signed)
Reason for visit: CAD, hyperlipidemia  Clinical Summary [name] is a 80 y.o.female last seen in April. She is here with her daughter today. She reports occasional angina symptoms and has to use one nitroglycerin. She is due for a refill. Remains functionally limited but does live in her own home on her daughter's property, handles her basic ADLs. She uses a rolling walker.  ECG today shows sinus rhythm with PACs and nonspecific ST changes. She continues to follow with Dr. Nevada Crane. We will request her most recent lab panel.  Most recent ischemic evaluation was via Persantine Myoview in June 2012. This study revealed evidence of mild basal inferior and inferolateral ischemia with LVEF 65%, overall low risk. Echocardiogram from April 2009 revealed mild LVH with LVEF greater than 83%, diastolic dysfunction, MAC with trace mitral regurgitation, trace tricuspid regurgitation, PASP 37 mmHg.   Allergies  Allergen Reactions  . Codeine Other (See Comments)    Made teeth loose  . Penicillins Rash  . Sulfa Antibiotics Rash    Current Outpatient Prescriptions  Medication Sig Dispense Refill  . ALPRAZolam (XANAX) 0.25 MG tablet Take 0.25 mg by mouth at bedtime as needed.       Marland Kitchen aspirin EC 81 MG tablet Take 81 mg by mouth daily.      . Choline Fenofibrate 135 MG capsule Take 135 mg by mouth daily.      Marland Kitchen gabapentin (NEURONTIN) 100 MG capsule Take 100 mg by mouth at bedtime.       Marland Kitchen latanoprost (XALATAN) 0.005 % ophthalmic solution Place 1 drop into both eyes at bedtime.      . Milnacipran (SAVELLA) 50 MG TABS Take 50 mg by mouth 2 (two) times daily.      . Multiple Vitamins-Minerals (MULTIVITAMIN WITH MINERALS) tablet Take 1 tablet by mouth daily.      . nitroGLYCERIN (NITROSTAT) 0.4 MG SL tablet Place 1 tablet (0.4 mg total) under the tongue every 5 (five) minutes as needed for chest pain.  25 tablet  5  . pantoprazole (PROTONIX) 40 MG tablet Take 40 mg by mouth daily.       . pioglitazone  (ACTOS) 15 MG tablet Take 15 mg by mouth 2 (two) times daily.       . rosuvastatin (CRESTOR) 20 MG tablet Take 20 mg by mouth daily.      . sitaGLIPtan-metformin (JANUMET) 50-1000 MG per tablet Take 1 tablet by mouth 2 (two) times daily with a meal.      . nitroGLYCERIN (NITROSTAT) 0.4 MG SL tablet Place 1 tablet (0.4 mg total) under the tongue every 5 (five) minutes as needed for chest pain.  25 tablet  3   No current facility-administered medications for this visit.    Past Medical History  Diagnosis Date  . Type 2 diabetes mellitus   . Hyperlipidemia   . Spinal stenosis   . Depression   . Glaucoma   . Nephrolithiasis   . Hydrocephalus   . Coronary atherosclerosis of native coronary artery     Multivessel status post CABG    Past Surgical History  Procedure Laterality Date  . Back surgery    . Kyphoplasty    . Foot surgery    . Ventriculoperitoneal shunt    . Appendectomy    . Coronary artery bypass graft  11/24/1991    SVG to OM1, SVG to RCA    Social History Ms. Ptacek reports that she quit smoking about 20 years ago. Her smoking use  included Cigarettes. She started smoking about 56 years ago. She smoked 2.00 packs per day. She has never used smokeless tobacco. Ms. Timberlake reports that she does not drink alcohol.  Review of Systems Complete review of systems negative except as otherwise outlined in the clinical summary and also the following.  Physical Examination Filed Vitals:   10/15/14 1531  BP: 116/70  Pulse: 95   Filed Weights   10/15/14 1531  Weight: 171 lb (77.565 kg)    Overweight, chronically ill-appearing woman.  /HEENT: Conjunctiva and lids normal, oropharynx clear.  Neck: Supple, no elevated JVP, soft carotid bruits, no thyromegaly.  Lungs: Clear to auscultation, nonlabored breathing at rest.  Cardiac: Regular rate and rhythm, no S3 or significant systolic murmur, no pericardial rub. Possible left subclavian bruit.  Abdomen: Soft, nontender,  bowel sounds present, no guarding or rebound.  Extremities: Trace edema and venous stasis, distal pulses 1+.    Problem List and Plan   Coronary atherosclerosis of native coronary artery Stable angina symptoms. Refill for nitroglycerin provided. Stress testing within the last 3 years was low risk with normal LVEF. We will continue observation for now.  Mixed hyperlipidemia She continues on Crestor. Requesting most recent labs from Dr. Nevada Crane.    Satira Sark, M.D., F.A.C.C.

## 2014-10-15 NOTE — Assessment & Plan Note (Signed)
Stable angina symptoms. Refill for nitroglycerin provided. Stress testing within the last 3 years was low risk with normal LVEF. We will continue observation for now.

## 2014-10-15 NOTE — Assessment & Plan Note (Signed)
She continues on Crestor. Requesting most recent labs from Dr. Nevada Crane.

## 2014-10-15 NOTE — Patient Instructions (Signed)
Your physician wants you to follow-up in: 6 months You will receive a reminder letter in the mail two months in advance. If you don't receive a letter, please call our office to schedule the follow-up appointment.     Your physician recommends that you continue on your current medications as directed. Please refer to the Current Medication list given to you today.      Thank you for choosing [facility] !        

## 2014-11-08 ENCOUNTER — Telehealth: Payer: Self-pay | Admitting: *Deleted

## 2014-11-08 ENCOUNTER — Ambulatory Visit (INDEPENDENT_AMBULATORY_CARE_PROVIDER_SITE_OTHER): Payer: Medicare Other | Admitting: Adult Health

## 2014-11-08 ENCOUNTER — Encounter: Payer: Self-pay | Admitting: Adult Health

## 2014-11-08 VITALS — BP 138/64 | HR 78 | Ht 63.0 in | Wt 167.5 lb

## 2014-11-08 DIAGNOSIS — Z1212 Encounter for screening for malignant neoplasm of rectum: Secondary | ICD-10-CM

## 2014-11-08 DIAGNOSIS — Z01419 Encounter for gynecological examination (general) (routine) without abnormal findings: Secondary | ICD-10-CM

## 2014-11-08 LAB — HEMOCCULT GUIAC POC 1CARD (OFFICE): FECAL OCCULT BLD: NEGATIVE

## 2014-11-08 NOTE — Patient Instructions (Signed)
Pap and physical in 2 year Mammogram yearly Call prn

## 2014-11-08 NOTE — Progress Notes (Signed)
Patient ID: [name], female   DOB: 18-Nov-1933, 80 y.o.   MRN: [id number] History of Present Illness: [name] is a 80 year old white female, widowed, in for gyn exam.Had normal pap with negative HPV 11/10/12.Got flu shot this year and pneumonia shot 3 years ago.   Current Medications, Allergies, Past Medical History, Past Surgical History, Family History and Social History were reviewed in [employer] record.     Review of Systems: Patient denies any headaches, blurred vision, shortness of breath, chest pain, abdominal pain, problems with bowel movements, urination, or intercourse. Not having sex, moods good.Complains of chronic back pain and feet burn at night.Using walker.    Physical Exam:BP 138/64 mmHg  Pulse 78  Ht 5\' 3"  (1.6 m)  Wt 167 lb 8 oz (75.978 kg)  BMI 29.68 kg/m2 General:  Well developed, well nourished, no acute distress Skin:  Warm and dry Neck:  Midline trachea, normal thyroid, no carotid bruits Lungs; Clear to auscultation bilaterally Breast:  No dominant palpable mass, retraction, or nipple discharge Cardiovascular: Regular rate and rhythm Abdomen:  Soft, non tender, no hepatosplenomegaly Pelvic:  External genitalia is normal in appearance, no lesions  The vagina is atrophic.   The cervix is atrophic.  Uterus is felt to be normal size, shape, and contour.  No adnexal masses or tenderness noted. Rectal: Good sphincter tone, no polyps, or hemorrhoids felt.  Hemoccult negative.Low rectocele Extremities:  No swelling or varicosities noted Psych:  No mood changes,alert and cooperative,seems happy   Impression: Well woman exam no pap    Plan: Pap and physical in 2 year Mammogram year Call prn Labs with PCP

## 2014-11-08 NOTE — Telephone Encounter (Signed)
Received labs in Dr. Myles Gip folder

## 2014-11-15 ENCOUNTER — Encounter: Payer: Self-pay | Admitting: Cardiology

## 2014-11-16 ENCOUNTER — Telehealth: Payer: Self-pay | Admitting: *Deleted

## 2014-11-16 NOTE — Telephone Encounter (Signed)
Received labs, in Dr. Myles Gip folder

## 2015-02-04 ENCOUNTER — Ambulatory Visit (HOSPITAL_COMMUNITY)
Admission: RE | Admit: 2015-02-04 | Discharge: 2015-02-04 | Disposition: A | Discharge status: Home / Self Care | Payer: Medicare Other | Source: Ambulatory Visit | Attending: Orthopedic Surgery | Admitting: Orthopedic Surgery

## 2015-02-04 ENCOUNTER — Other Ambulatory Visit: Payer: Self-pay | Admitting: Orthopedic Surgery

## 2015-02-04 ENCOUNTER — Other Ambulatory Visit: Payer: Self-pay | Admitting: Adult Health

## 2015-02-04 DIAGNOSIS — M25512 Pain in left shoulder: Secondary | ICD-10-CM

## 2015-02-04 DIAGNOSIS — M25511 Pain in right shoulder: Secondary | ICD-10-CM | POA: Diagnosis not present

## 2015-02-04 DIAGNOSIS — M542 Cervicalgia: Secondary | ICD-10-CM

## 2015-02-04 DIAGNOSIS — M19012 Primary osteoarthritis, left shoulder: Secondary | ICD-10-CM | POA: Diagnosis not present

## 2015-02-04 DIAGNOSIS — Z1231 Encounter for screening mammogram for malignant neoplasm of breast: Secondary | ICD-10-CM

## 2015-02-04 DIAGNOSIS — M19011 Primary osteoarthritis, right shoulder: Secondary | ICD-10-CM | POA: Diagnosis not present

## 2015-02-07 ENCOUNTER — Ambulatory Visit (HOSPITAL_COMMUNITY)
Admission: RE | Admit: 2015-02-07 | Discharge: 2015-02-07 | Disposition: A | Discharge status: Home / Self Care | Payer: Medicare Other | Source: Ambulatory Visit | Attending: Adult Health | Admitting: Adult Health

## 2015-02-07 DIAGNOSIS — Z1231 Encounter for screening mammogram for malignant neoplasm of breast: Secondary | ICD-10-CM | POA: Diagnosis not present

## 2015-02-08 ENCOUNTER — Ambulatory Visit (INDEPENDENT_AMBULATORY_CARE_PROVIDER_SITE_OTHER): Payer: Medicare Other | Admitting: Orthopedic Surgery

## 2015-02-08 ENCOUNTER — Encounter: Payer: Self-pay | Admitting: Orthopedic Surgery

## 2015-02-08 VITALS — BP 152/95 | Ht 63.0 in | Wt 167.5 lb

## 2015-02-08 DIAGNOSIS — M47812 Spondylosis without myelopathy or radiculopathy, cervical region: Secondary | ICD-10-CM

## 2015-02-08 NOTE — Patient Instructions (Addendum)
Call to arrange therapy at [facility] therapy dept  Follow up with your primary care doctor

## 2015-02-08 NOTE — Progress Notes (Signed)
Patient ID: [name], female   DOB: [birth year], 81 y.o.   MRN: [id number]  Chief Complaint  Patient presents with  . Shoulder Pain    bilateral shoulder pain, Rt>Lt    HPI [name] is a 81 y.o. female.   HPI 81 year old female presents for bilateral shoulder pain evaluation. She has had open treatment internal fixation right ankle along with left ankle fracture about 8-10 years ago. Since that time she's had a shuffling gait and progressive inability to walk now using a rolling walker with a seat. She presents on this occasion with pain in both traps and decreased range of motion both shoulders. Her pain is constant 7 out of 10 unrelieved by Tylenol arthritis she has not had physical therapy or anti-inflammatory medication. The pain is sharp nonradiating.  Upon further questioning the patient has had shuffling gait now for about 8-10 years she is unable to lift her legs to clear her feet so she shuffles with a walker.   Review of Systems Review of Systems  All other systems reviewed and are negative.  She reports night sweats, constipation, joint pain, limb pain, muscle weakness, weakness, burning pain in her legs.  Past Medical History  Diagnosis Date  . Type 2 diabetes mellitus   . Hyperlipidemia   . Spinal stenosis   . Depression   . Glaucoma   . Nephrolithiasis   . Hydrocephalus   . Coronary atherosclerosis of native coronary artery     Multivessel status post CABG    Past Surgical History  Procedure Laterality Date  . Back surgery    . Kyphoplasty    . Foot surgery    . Ventriculoperitoneal shunt    . Appendectomy    . Coronary artery bypass graft  11/24/1991    SVG to OM1, SVG to RCA    Family History  Problem Relation Age of Onset  . Cancer Father   . Diabetes Brother   . Heart disease Brother   . Hyperlipidemia Daughter     Social History History  Substance Use Topics  . Smoking status: Former Smoker -- 2.00 packs/day    Types:  Cigarettes    Start date: 01/15/1958    Quit date: 12/17/1993  . Smokeless tobacco: Never Used  . Alcohol Use: No    Allergies  Allergen Reactions  . Codeine Other (See Comments)    Made teeth loose  . Penicillins Rash  . Sulfa Antibiotics Rash    Current Outpatient Prescriptions  Medication Sig Dispense Refill  . ALPRAZolam (XANAX) 0.25 MG tablet Take 0.25 mg by mouth at bedtime as needed.     Marland Kitchen aspirin EC 81 MG tablet Take 81 mg by mouth daily.    . Choline Fenofibrate 135 MG capsule Take 135 mg by mouth daily.    . dorzolamide-timolol (COSOPT) 22.3-6.8 MG/ML ophthalmic solution Place 1 drop into both eyes 2 (two) times daily.     Marland Kitchen gabapentin (NEURONTIN) 100 MG capsule Take 100 mg by mouth at bedtime.     Marland Kitchen latanoprost (XALATAN) 0.005 % ophthalmic solution Place 1 drop into both eyes at bedtime.    . Milnacipran (SAVELLA) 50 MG TABS Take 50 mg by mouth 2 (two) times daily.    . Multiple Vitamins-Minerals (MULTIVITAMIN WITH MINERALS) tablet Take 1 tablet by mouth daily.    . nitroGLYCERIN (NITROSTAT) 0.4 MG SL tablet Place 1 tablet (0.4 mg total) under the tongue every 5 (five) minutes as needed for chest pain. 25  tablet 3  . pantoprazole (PROTONIX) 40 MG tablet Take 40 mg by mouth daily.     . pioglitazone (ACTOS) 15 MG tablet Take 15 mg by mouth daily.     . rosuvastatin (CRESTOR) 20 MG tablet Take 20 mg by mouth daily.    . sitaGLIPtan-metformin (JANUMET) 50-1000 MG per tablet Take 1 tablet by mouth 2 (two) times daily with a meal.     No current facility-administered medications for this visit.       Physical Exam Blood pressure 152/95, height 5\' 3"  (1.6 m), weight 167 lb 8 oz (75.978 kg). Physical Exam  Constitutional: She is oriented to person, place, and time. She appears well-developed and well-nourished. No distress.  Neck: Neck supple. No JVD present. No spinous process tenderness and no muscular tenderness present. No tracheal deviation and normal range of motion  present. No thyromegaly present.    Musculoskeletal:  Left shoulder passive range of motion 150 active 120  Right shoulder active and passive range of motion 120 but pain lasts  Most of her tenderness was in the midline of her cervical spine and over the right and left trapezius muscle with increased muscle tension suggesting muscle spasm  Lymphadenopathy:    She has no cervical adenopathy.  Neurological: She is alert and oriented to person, place, and time. She displays normal reflexes. She exhibits normal muscle tone.  Skin: Skin is warm and dry. She is not diaphoretic.  Psychiatric: She has a normal mood and affect.   The patient is well developed well nourished and well groomed. Orientation to person place and time is normal  Mood is pleasant. Ambulatory status uses a walker has a shuffling gait   Data Reviewed She has several images done of the shoulders and cervical spine I independently interpreted that as arthritis right shoulder normal left shoulder cervical spondylosis  IMPRESSION: Shoulder There is no acute or significant chronic bony abnormality of the left shoulder. Mild degenerative change of the [facility] joint is noted.   IMPRESSION: Right shoulder There is moderate osteoarthritic change of the glenohumeral joint with marginal spurs. Given the patient's limited range of motion, MRI may be useful.  FINDINGS: The cervical vertebral bodies are preserved in height. The disc space heights are reasonably well maintained. The oblique views reveal mild encroachment upon the neural foramina bilaterally at multiple levels. There is mild facet joint hypertrophy at multiple levels as well. The odontoid and spinous processes are intact.   IMPRESSION: Cervical spine There is multilevel bilateral facet joint hypertrophy and uncovertebral joint hypertrophy. There is no high-grade disc space narrowing nor evidence of a compression fracture. A ventriculoperitoneal shunt tube is  partially imaged.    Assessment    Cervical spondylosis    Plan    Recommend a trial of physical therapy. Her medical condition age and cervical spine problem are probably causing her gait disturbance. I do not find any significant shoulder pathology  If she does not improve with physical therapy I would recommend MRI and referral to appropriate neurosurgeon.

## 2015-02-28 ENCOUNTER — Ambulatory Visit (HOSPITAL_COMMUNITY): Payer: Medicare Other | Attending: Orthopedic Surgery | Admitting: Physical Therapy

## 2015-02-28 ENCOUNTER — Encounter (HOSPITAL_COMMUNITY): Payer: Self-pay | Admitting: Physical Therapy

## 2015-02-28 DIAGNOSIS — R293 Abnormal posture: Secondary | ICD-10-CM | POA: Insufficient documentation

## 2015-02-28 DIAGNOSIS — Z6829 Body mass index (BMI) 29.0-29.9, adult: Secondary | ICD-10-CM | POA: Diagnosis not present

## 2015-02-28 DIAGNOSIS — R269 Unspecified abnormalities of gait and mobility: Secondary | ICD-10-CM | POA: Insufficient documentation

## 2015-02-28 DIAGNOSIS — Z7409 Other reduced mobility: Secondary | ICD-10-CM | POA: Diagnosis not present

## 2015-02-28 DIAGNOSIS — R531 Weakness: Secondary | ICD-10-CM

## 2015-02-28 DIAGNOSIS — M6281 Muscle weakness (generalized): Secondary | ICD-10-CM | POA: Insufficient documentation

## 2015-02-28 DIAGNOSIS — Z0000 Encounter for general adult medical examination without abnormal findings: Secondary | ICD-10-CM | POA: Diagnosis not present

## 2015-02-28 NOTE — Patient Instructions (Signed)
Hamstring Stretch   Place sound foot on the floor, keep back and residual limb knee straight. Lean forward until a stretch is felt in back of residual limb thigh. Hold _30 seconds, perform two times each leg. Do twice a day._   Retraction: Scapula - Bilateral   Sit up straight and tall, and squeeze shoulder blades together. Repeat __10__ times per set. Do __1__ sets per session. Do __2__ sessions per day. Use __0__ lb weights.  .  Copyright  VHI. All rights reserved.  Chair Push-Up   Stand up from standard chair, trying not to use arms. Repeat at least 5 times; do this twice a day. ONLY DO THIS ONE AT A CHAIR WITH KITCHEN TABLE IN FRONT OF YOU.   http://gt2.exer.us/466   Copyright  VHI. All rights reserved.  EXTENSION: Standing (Active)   Stand, both feet flat. Draw right leg behind body as far as possible. Use _0__ lbs. Complete __1_ sets of _10__ repetitions. Perform _2__ sessions per day.  http://gtsc.exer.us/77   Copyright  VHI. All rights reserved.  ABDUCTION: Standing (Active)   Stand, feet flat. Lift right leg out to side. Use _0__ lbs. Complete __1_ sets of _10__ repetitions. Perform _2__ sessions per day.  http://gtsc.exer.us/111   Copyright  VHI. All rights reserved.

## 2015-02-28 NOTE — Therapy (Signed)
[facility] [place], [place], [address] Phone: [phone]   Fax:  [phone]  Physical Therapy Evaluation  Patient Details  Name: [name] MRN: [id number] Date of Birth: April 16, 1933 Referring Provider:  Carole Civil, MD  Encounter Date: 02/28/2015      PT End of Session - 02/28/15 1207    Visit Number 1   Number of Visits 18   Date for PT Re-Evaluation 03/21/15   Authorization Type [employer]   Authorization Time Period 02/28/15 to 04/30/15   Authorization - Visit Number 1   Authorization - Number of Visits 10   PT Start Time 1103   PT Stop Time 1152   PT Time Calculation (min) 49 min   Activity Tolerance Patient tolerated treatment well   Behavior During Therapy [facility] for tasks assessed/performed      Past Medical History  Diagnosis Date  . Type 2 diabetes mellitus   . Hyperlipidemia   . Spinal stenosis   . Depression   . Glaucoma   . Nephrolithiasis   . Hydrocephalus   . Coronary atherosclerosis of native coronary artery     Multivessel status post CABG    Past Surgical History  Procedure Laterality Date  . Back surgery    . Kyphoplasty    . Foot surgery    . Ventriculoperitoneal shunt    . Appendectomy    . Coronary artery bypass graft  11/24/1991    SVG to OM1, SVG to RCA    There were no vitals filed for this visit.  Visit Diagnosis:  Abnormality of gait  Poor posture  Proximal muscle weakness  Generalized weakness  Decreased functional mobility and endurance      Subjective Assessment - 02/28/15 1200    Symptoms Headache sometimes; shunt in head and neck. Main complaint is difficulty walking and fear of falling.    Pertinent History Patient went to see MD; MD states that he thinks most of trouble is coming from neck and shoulders. Shunt in head that runs down through neck. Has been years. Does have a history of significant ankle injuries that occurred 15 years ago.    How long can you sit comfortably? 2 hours   How long can you stand comfortably? 1 hour with walker   How long can you walk comfortably? Some days patient can walk further than others; good days and bad days   Patient Stated Goals better at walking   Currently in Pain? No/denies            [facility] PT Assessment - 02/28/15 0001    Assessment   Next MD Visit Dr. Nevada Crane later today   Balance Screen   Has the patient fallen in the past 6 months Yes   How many times? 1 fall in kitchen a month ago   Has the patient had a decrease in activity level because of a fear of falling?  Yes   Is the patient reluctant to leave their home because of a fear of falling?  Yes   Observation/Other Assessments   Observations Shuffling gait pattern, high level of difficulty in clearing bilateral feet from ground, reduced DF bilaterally, reduced TKE bilaterally, weakness and reduced motion hip extensors and abductors, flexed posture, poor balance response skills, poor gait tolerance, forward lean in general with weight on balls of toes   Focus on Therapeutic Outcomes (FOTO)  56% limited   Posture/Postural Control   Posture Comments Flexed posture, IR  shoulders, shoulder elevation present, protracted scapula, poor ability to self-correct posture   AROM   Right Shoulder Flexion 130 Degrees   Right Shoulder ABduction 130 Degrees   Left Shoulder Flexion 115 Degrees   Left Shoulder ABduction 35 Degrees   Cervical Flexion 54   Cervical Extension 34   Cervical - Right Side Bend 32  patient has apparent difficulty separating lat flex/rotat   Cervical - Left Side Bend 35   Cervical - Right Rotation 57   Cervical - Left Rotation 49   Strength   Right Shoulder Flexion 4-/5   Right Shoulder ABduction 3+/5  pain with resistance and positive empty can test   Right Shoulder External Rotation 4/5   Left Shoulder Flexion 4-/5   Left Shoulder ABduction 3/5   Left Shoulder Internal Rotation 4/5   Left Shoulder External  Rotation 4-/5   Right Hip Flexion 2/5   Left Hip Flexion 3-/5   Right Knee Flexion 3-/5   Right Knee Extension 4/5   Left Knee Flexion 3/5   Left Knee Extension 4-/5   Right Ankle Dorsiflexion 4-/5   Left Ankle Dorsiflexion 4-/5   Cervical Flexion 4-/5   Cervical Extension 3+/5   Cervical - Right Side Bend 4-/5   Cervical - Left Side Bend 4-/5   Flexibility   Hamstrings Bilateral tightness noted                            PT Education - 02/28/15 1206    Education provided Yes   Education Details Education provided regarding "big picture", IE difficulty walking likely due to general deconditioning and fear of falling; cervical pain may contribute but likely not root cause   Person(s) Educated Patient   Methods Explanation   Comprehension Verbalized understanding          PT Short Term Goals - 02/28/15 1212    PT SHORT TERM GOAL #1   Title Patient will complain of no more than 1/10 pain in her neck in order to reduce overall discomfort and allow patient to participate in functional tasks   Time 3   Period Weeks   Status New   PT SHORT TERM GOAL #2   Title Patient will require cues no more than 25% of the time for good posture and proper biomechanics    Time 3   Period Weeks   Status New   PT SHORT TERM GOAL #3   Title Patient will demonstrate muscle strength of at least 3+/5 in proximal hip and core musculature in order to increase stability and improve overall gait and mobility patterns   Time 3   Period Weeks   Status New   PT SHORT TERM GOAL #4   Title Patient will demonstrate the ability to consistently ambulate at least 755ft on level surfaces with rollator and improved gait mechanics including improved step length, improved posture, equal step lengths, improved bilateral foot clearance, and minimal fatigue           PT Long Term Goals - 02/28/15 1214    PT LONG TERM GOAL #1   Title Patient will score at least 48 on Berg balance test in  order to demonstrate improved overall balance skills and reduce overall fall risk   Time 6   Period Weeks   Status New   PT LONG TERM GOAL #2   Title Patient will be independent in correcting her posture and will be able to  verbally explain the relationship of upright posture to improved function during functional tasks   Time 6   Period Weeks   Status New   PT LONG TERM GOAL #3   Title Patient will demonstrate the ability to ambulate at least 1575ft consistently and with good mechancis including equal step length, full TKE bilaterally, improved bilateral hip rotaion, improved bilateral foot clearance, upright posture, improved gait speed, and no fatigue   Time 6   Period Weeks   Status New   PT LONG TERM GOAL #4   Title Patient will demonstrate the ability to ambulate safely within tight spaces and over uneven surfaces with minimal risk of falling   Time 6   Period Weeks   Status New   PT LONG TERM GOAL #5   Title Patient will demonstrate the ability to safely and efficiently perform transfers from surfaces of various heights and demonstrate good safety awareness throughout in order to reduce overall fall risk at home   Time 6   Period Weeks   Status New   Additional Long Term Goals   Additional Long Term Goals Yes   PT LONG TERM GOAL #6   Title Patient will demonstrate a grade of at least 4/5 in bilateral proximal hip and core musculature in order to increase stabllity and reduce overall fall risk   Time 6   Period Weeks   Status New               Plan - 03/12/2015 1208    Clinical Impression Statement Patient presents with severe impairments in gait mechanics, significantly reduced proximal muscle strength, reduced muscle flexibiilty and general soft tissue tightness, postural impairments, limited functional activity tolerance, limited gait tolerance, poor balance reaction skills, as well as complaints of legs giving out during gait. Patient is most concerned about  difficulty with gait, minimal concern about neck although limitations in cervical and bilateral shoulder motion and strength are present. Patient's deficits are likely due to long term deconditioning, and patient will benefit from skilled PT services in order to address these stated impairments and reach an optimal level of function with mininmal fall risk.    Pt will benefit from skilled therapeutic intervention in order to improve on the following deficits Abnormal gait;Decreased endurance;Decreased knowledge of precautions;Impaired perceived functional ability;Improper body mechanics;Decreased activity tolerance;Decreased strength;Impaired flexibility;Postural dysfunction;Decreased balance;Decreased mobility;Difficulty walking;Decreased range of motion;Pain;Decreased coordination;Decreased safety awareness   Rehab Potential Good   PT Frequency 3x / week   PT Duration 6 weeks   PT Treatment/Interventions ADLs/Self Care Home Management;Gait training;Neuromuscular re-education;Stair training;Functional mobility training;Patient/family education;Therapeutic activities;Electrical Stimulation;Therapeutic exercise;Manual techniques;Energy conservation;Moist Heat;DME Instruction;Balance training   PT Next Visit Plan Functional strengthening and stretching; Berg balance test   PT Home Exercise Plan given   Consulted and Agree with Plan of Care Patient;Family member/caregiver   Family Member Consulted grand-daughter          G-Codes - 03/12/2015 1220    Functional Assessment Tool Used FOTO   Functional Limitation Mobility: Walking and moving around   Mobility: Walking and Moving Around Current Status [phone]) At least 40 percent but less than 60 percent impaired, limited or restricted   Mobility: Walking and Moving Around Goal Status [phone]) At least 20 percent but less than 40 percent impaired, limited or restricted       Problem List Patient Active Problem List   Diagnosis Date Noted  . Coronary  atherosclerosis of native coronary artery 09/11/2013  . Mixed hyperlipidemia 09/11/2013  . Carotid  artery occlusion without infarction 09/11/2013  . Type 2 diabetes mellitus 09/11/2013  . Difficulty in walking(719.7) 06/24/2013    Deniece Ree PT, DPT [phone]  [facility] [address] [place], [place], [address] Phone: [phone]   Fax:  [phone]

## 2015-03-03 ENCOUNTER — Ambulatory Visit (HOSPITAL_COMMUNITY): Payer: Medicare Other | Admitting: Physical Therapy

## 2015-03-03 DIAGNOSIS — R293 Abnormal posture: Secondary | ICD-10-CM

## 2015-03-03 DIAGNOSIS — R269 Unspecified abnormalities of gait and mobility: Secondary | ICD-10-CM

## 2015-03-03 DIAGNOSIS — R296 Repeated falls: Secondary | ICD-10-CM

## 2015-03-03 DIAGNOSIS — M6281 Muscle weakness (generalized): Secondary | ICD-10-CM

## 2015-03-03 DIAGNOSIS — Z7409 Other reduced mobility: Secondary | ICD-10-CM

## 2015-03-03 DIAGNOSIS — R531 Weakness: Secondary | ICD-10-CM

## 2015-03-03 NOTE — Therapy (Signed)
[place], [place], [address] Phone: [phone]   Fax:  [phone]  Physical Therapy Treatment  Patient Details  Name: [name] MRN: [id number] Date of Birth: Sep 03, 1933 Referring Provider:  Delphina Cahill, MD  Encounter Date: 03/03/2015      PT End of Session - 03/03/15 0923    Visit Number 2   Number of Visits 18   Date for PT Re-Evaluation 03/21/15   Authorization Type [employer] Medicare   Authorization Time Period 02/28/15 to 04/30/15   Authorization - Visit Number 2   Authorization - Number of Visits 10   PT Start Time 0825   PT Stop Time 0922   PT Time Calculation (min) 57 min   Activity Tolerance Patient tolerated treatment well   Behavior During Therapy [facility] for tasks assessed/performed      Past Medical History  Diagnosis Date  . Type 2 diabetes mellitus   . Hyperlipidemia   . Spinal stenosis   . Depression   . Glaucoma   . Nephrolithiasis   . Hydrocephalus   . Coronary atherosclerosis of native coronary artery     Multivessel status post CABG    Past Surgical History  Procedure Laterality Date  . Back surgery    . Kyphoplasty    . Foot surgery    . Ventriculoperitoneal shunt    . Appendectomy    . Coronary artery bypass graft  11/24/1991    SVG to OM1, SVG to RCA    There were no vitals filed for this visit.  Visit Diagnosis:  Abnormality of gait  Poor posture  Generalized weakness  Falls frequently  Proximal muscle weakness  Decreased functional mobility and endurance      Subjective Assessment - 03/03/15 0838    Symptoms Pt states she has been doing the exercises that have been given her.  Pt states that she does not do anything and rarely leaves her home.  Pt fell two weeks ago .   Currently in Pain? No/denies                       [facility] Adult PT Treatment/Exercise - 03/03/15 0001    Exercises   Exercises Lumbar   Lumbar Exercises: Stretches    Active Hamstring Stretch --  2 minutes with therapist assist,(push down opposite leg too)   Single Knee to Chest Stretch --  2 minutes B   Lower Trunk Rotation --  with opposite arm rotation x 10    Lumbar Exercises: Standing   Other Standing Lumbar Exercises marching x 10;  Gt train concentrating on heel strike; push off, step through gt pattern and equalizing strides.    Lumbar Exercises: Seated   Sit to Stand 10 reps   Other Seated Lumbar Exercises 3 D thoracic excursion x 5    Other Seated Lumbar Exercises    Lumbar Exercises: Supine   Bent Knee Raise 10 reps   Dead Bug 10 reps   Bridge 10 reps  up then put Rt hip forward then Lt forward then down.                   PT Short Term Goals - 02/28/15 1212    PT SHORT TERM GOAL #1   Title Patient will complain of no more than 1/10 pain in her neck in order to reduce overall discomfort and allow patient to participate in functional tasks   Time 3  Period Weeks   Status New   PT SHORT TERM GOAL #2   Title Patient will require cues no more than 25% of the time for good posture and proper biomechanics    Time 3   Period Weeks   Status New   PT SHORT TERM GOAL #3   Title Patient will demonstrate muscle strength of at least 3+/5 in proximal hip and core musculature in order to increase stability and improve overall gait and mobility patterns   Time 3   Period Weeks   Status New   PT SHORT TERM GOAL #4   Title Patient will demonstrate the ability to consistently ambulate at least 740ft on level surfaces with rollator and improved gait mechanics including improved step length, improved posture, equal step lengths, improved bilateral foot clearance, and minimal fatigue           PT Long Term Goals - 02/28/15 1214    PT LONG TERM GOAL #1   Title Patient will score at least 48 on Berg balance test in order to demonstrate improved overall balance skills and reduce overall fall risk   Time 6   Period Weeks   Status New    PT LONG TERM GOAL #2   Title Patient will be independent in correcting her posture and will be able to verbally explain the relationship of upright posture to improved function during functional tasks   Time 6   Period Weeks   Status New   PT LONG TERM GOAL #3   Title Patient will demonstrate the ability to ambulate at least 1525ft consistently and with good mechancis including equal step length, full TKE bilaterally, improved bilateral hip rotaion, improved bilateral foot clearance, upright posture, improved gait speed, and no fatigue   Time 6   Period Weeks   Status New   PT LONG TERM GOAL #4   Title Patient will demonstrate the ability to ambulate safely within tight spaces and over uneven surfaces with minimal risk of falling   Time 6   Period Weeks   Status New   PT LONG TERM GOAL #5   Title Patient will demonstrate the ability to safely and efficiently perform transfers from surfaces of various heights and demonstrate good safety awareness throughout in order to reduce overall fall risk at home   Time 6   Period Weeks   Status New   Additional Long Term Goals   Additional Long Term Goals Yes   PT LONG TERM GOAL #6   Title Patient will demonstrate a grade of at least 4/5 in bilateral proximal hip and core musculature in order to increase stabllity and reduce overall fall risk   Time 6   Period Weeks   Status New               Plan - 03/03/15 0093    Clinical Impression Statement Treatment focused on stretching of LE and trunk mm, core strengthening and gt mechanics.  Pt is able to ambulate without festering gt in // with constant verbal cuing to increase Lt stride length but at this point step through gait does not carry over to when pt is walking with her walker.   Pt may be a better candidate for the TUG rather than Merrilee Jansky will discuss with primary therapist and put into plan.     Pt will benefit from skilled therapeutic intervention in order to improve on the following  deficits Abnormal gait;Decreased endurance;Decreased knowledge of precautions;Impaired perceived functional ability;Improper body mechanics;Decreased  activity tolerance;Decreased strength;Impaired flexibility;Postural dysfunction;Decreased balance;Decreased mobility;Difficulty walking;Decreased range of motion;Pain;Decreased coordination;Decreased safety awareness   PT Treatment/Interventions ADLs/Self Care Home Management;Gait training;Neuromuscular re-education;Stair training;Functional mobility training;Patient/family education;Therapeutic activities;Electrical Stimulation;Therapeutic exercise;Manual techniques;Energy conservation;Moist Heat;DME Instruction;Balance training   PT Next Visit Plan Continue with stretching, gt and complete TUG         Problem List Patient Active Problem List   Diagnosis Date Noted  . Coronary atherosclerosis of native coronary artery 09/11/2013  . Mixed hyperlipidemia 09/11/2013  . Carotid artery occlusion without infarction 09/11/2013  . Type 2 diabetes mellitus 09/11/2013  . Difficulty in walking(719.7) 06/24/2013    RUSSELL,CINDY PT 03/03/2015, 9:31 AM  [place], [place], [address] Phone: [phone]   Fax:  [phone]

## 2015-03-08 ENCOUNTER — Ambulatory Visit (HOSPITAL_COMMUNITY): Payer: Medicare Other | Admitting: Physical Therapy

## 2015-03-08 DIAGNOSIS — R293 Abnormal posture: Secondary | ICD-10-CM

## 2015-03-08 DIAGNOSIS — Z7409 Other reduced mobility: Secondary | ICD-10-CM | POA: Diagnosis not present

## 2015-03-08 DIAGNOSIS — M6281 Muscle weakness (generalized): Secondary | ICD-10-CM

## 2015-03-08 DIAGNOSIS — R269 Unspecified abnormalities of gait and mobility: Secondary | ICD-10-CM | POA: Diagnosis not present

## 2015-03-08 DIAGNOSIS — R296 Repeated falls: Secondary | ICD-10-CM

## 2015-03-08 DIAGNOSIS — R531 Weakness: Secondary | ICD-10-CM

## 2015-03-08 NOTE — Therapy (Signed)
[place], [place], [address] Phone: [phone]   Fax:  [phone]  Physical Therapy Treatment  Patient Details  Name: [name] MRN: [id number] Date of Birth: 1933-01-15 Referring Provider:  Delphina Cahill, MD  Encounter Date: 03/08/2015      PT End of Session - 03/08/15 0908    Visit Number 3   Number of Visits 18   Date for PT Re-Evaluation 03/21/15   Authorization Type [employer] Medicare   Authorization Time Period 02/28/15 to 04/30/15   Authorization - Visit Number 3   Authorization - Number of Visits 10   PT Start Time 0813   PT Stop Time 0900   PT Time Calculation (min) 47 min   Activity Tolerance Patient tolerated treatment well   Behavior During Therapy [facility] for tasks assessed/performed      Past Medical History  Diagnosis Date  . Type 2 diabetes mellitus   . Hyperlipidemia   . Spinal stenosis   . Depression   . Glaucoma   . Nephrolithiasis   . Hydrocephalus   . Coronary atherosclerosis of native coronary artery     Multivessel status post CABG    Past Surgical History  Procedure Laterality Date  . Back surgery    . Kyphoplasty    . Foot surgery    . Ventriculoperitoneal shunt    . Appendectomy    . Coronary artery bypass graft  11/24/1991    SVG to OM1, SVG to RCA    There were no vitals filed for this visit.  Visit Diagnosis:  Abnormality of gait  Poor posture  Generalized weakness  Falls frequently  Proximal muscle weakness  Decreased functional mobility and endurance      Subjective Assessment - 03/08/15 0822    Symptoms Patient reports that she is having a bit of pain in her shoulder but this is her baseline and no other pain this morning   Pertinent History Patient went to see MD; MD states that he thinks most of trouble is coming from neck and shoulders. Shunt in head that runs down through neck. Has been years. Does have a history of significant ankle injuries  that occurred 15 years ago.    Currently in Pain? Yes   Pain Score 1    Pain Location Shoulder   Pain Orientation Right                       [facility] Adult PT Treatment/Exercise - 03/08/15 0001    Ambulation/Gait   Ambulation/Gait Yes   Ambulation/Gait Assistance 4: Min guard   Ambulation Distance (Feet) --  28ft x4   Assistive device 4-wheeled walker   Gait Comments TUG 36.2 seconds, 35.1 seconds, 32.0 seconds with 4 wheeled walker; also performed gait training  31ftx4 including cues for improved step length bilaterally, heel-toe gait pattern, improved posture, and adjustment of gait speed to allow for improved mechanics   Lumbar Exercises: Standing   Other Standing Lumbar Exercises Sit to stand with use of hands 1x10   Other Standing Lumbar Exercises Weight shifting lateral and anterior/posterior; attempted weight shifting with step forward but patient had difficulty with coordination of  task despite multimodal cues   Lumbar Exercises: Supine   Dead Bug 10 reps   Bridge 15 reps   Bridge Limitations standard    Straight Leg Raise 10 reps   Lumbar Exercises: Sidelying   Hip Abduction 10 reps   Hip Abduction  Limitations manual facilitation for proper form                PT Education - 03/08/15 0907    Education provided Yes   Education Details education regarding importance of taking her time with gait to allow for improved mechanics and increased safety   Person(s) Educated Patient   Methods Explanation   Comprehension Verbalized understanding          PT Short Term Goals - 02/28/15 1212    PT SHORT TERM GOAL #1   Title Patient will complain of no more than 1/10 pain in her neck in order to reduce overall discomfort and allow patient to participate in functional tasks   Time 3   Period Weeks   Status New   PT SHORT TERM GOAL #2   Title Patient will require cues no more than 25% of the time for good posture and proper biomechanics    Time 3    Period Weeks   Status New   PT SHORT TERM GOAL #3   Title Patient will demonstrate muscle strength of at least 3+/5 in proximal hip and core musculature in order to increase stability and improve overall gait and mobility patterns   Time 3   Period Weeks   Status New   PT SHORT TERM GOAL #4   Title Patient will demonstrate the ability to consistently ambulate at least 758ft on level surfaces with rollator and improved gait mechanics including improved step length, improved posture, equal step lengths, improved bilateral foot clearance, and minimal fatigue           PT Long Term Goals - 02/28/15 1214    PT LONG TERM GOAL #1   Title Patient will score at least 48 on Berg balance test in order to demonstrate improved overall balance skills and reduce overall fall risk   Time 6   Period Weeks   Status New   PT LONG TERM GOAL #2   Title Patient will be independent in correcting her posture and will be able to verbally explain the relationship of upright posture to improved function during functional tasks   Time 6   Period Weeks   Status New   PT LONG TERM GOAL #3   Title Patient will demonstrate the ability to ambulate at least 1586ft consistently and with good mechancis including equal step length, full TKE bilaterally, improved bilateral hip rotaion, improved bilateral foot clearance, upright posture, improved gait speed, and no fatigue   Time 6   Period Weeks   Status New   PT LONG TERM GOAL #4   Title Patient will demonstrate the ability to ambulate safely within tight spaces and over uneven surfaces with minimal risk of falling   Time 6   Period Weeks   Status New   PT LONG TERM GOAL #5   Title Patient will demonstrate the ability to safely and efficiently perform transfers from surfaces of various heights and demonstrate good safety awareness throughout in order to reduce overall fall risk at home   Time 6   Period Weeks   Status New   Additional Long Term Goals    Additional Long Term Goals Yes   PT LONG TERM GOAL #6   Title Patient will demonstrate a grade of at least 4/5 in bilateral proximal hip and core musculature in order to increase stabllity and reduce overall fall risk   Time 6   Period Weeks   Status New  Plan - 03/08/15 0908    Clinical Impression Statement Today's treatment focused on core and proximal muscle strengthening, functional weight shifting, and improved gait mechanics. Patient demonstrates overall poor exercise tolerance due to deconditioning but does participate well through sessions and shows improved motivation to participate in therapy. Difficulty with functional weight shfifting but able to perform with multimodal cues. Able to perform gait with improved mechanics and cues for appropriate pacing/mechanics but does have tendency to rush, which causes her gait mechanics to worsen and incresaed overall fall risk.    Pt will benefit from skilled therapeutic intervention in order to improve on the following deficits Abnormal gait;Decreased endurance;Decreased knowledge of precautions;Impaired perceived functional ability;Improper body mechanics;Decreased activity tolerance;Decreased strength;Impaired flexibility;Postural dysfunction;Decreased balance;Decreased mobility;Difficulty walking;Decreased range of motion;Pain;Decreased coordination;Decreased safety awareness   Rehab Potential Good   PT Frequency 3x / week   PT Duration 6 weeks   PT Treatment/Interventions ADLs/Self Care Home Management;Gait training;Neuromuscular re-education;Stair training;Functional mobility training;Patient/family education;Therapeutic activities;Electrical Stimulation;Therapeutic exercise;Manual techniques;Energy conservation;Moist Heat;DME Instruction;Balance training   PT Next Visit Plan Stretching; gait training; functional weight shifting; core strength   PT Home Exercise Plan given   Consulted and Agree with Plan of Care Patient         Problem List Patient Active Problem List   Diagnosis Date Noted  . Coronary atherosclerosis of native coronary artery 09/11/2013  . Mixed hyperlipidemia 09/11/2013  . Carotid artery occlusion without infarction 09/11/2013  . Type 2 diabetes mellitus 09/11/2013  . Difficulty in walking(719.7) 06/24/2013   Deniece Ree PT, DPT [phone]  [place] [address] [place], [place], [address] Phone: [phone]   Fax:  [phone]

## 2015-03-09 ENCOUNTER — Ambulatory Visit (HOSPITAL_COMMUNITY): Payer: Medicare Other | Admitting: Physical Therapy

## 2015-03-09 DIAGNOSIS — M6281 Muscle weakness (generalized): Secondary | ICD-10-CM

## 2015-03-09 DIAGNOSIS — R293 Abnormal posture: Secondary | ICD-10-CM | POA: Diagnosis not present

## 2015-03-09 DIAGNOSIS — R269 Unspecified abnormalities of gait and mobility: Secondary | ICD-10-CM | POA: Diagnosis not present

## 2015-03-09 DIAGNOSIS — Z7409 Other reduced mobility: Secondary | ICD-10-CM | POA: Diagnosis not present

## 2015-03-09 DIAGNOSIS — R531 Weakness: Secondary | ICD-10-CM

## 2015-03-09 DIAGNOSIS — R296 Repeated falls: Secondary | ICD-10-CM

## 2015-03-09 NOTE — Therapy (Signed)
[place], [place], [address] Phone: [phone]   Fax:  [phone]  Physical Therapy Treatment  Patient Details  Name: [name] MRN: [id number] Date of Birth: 27-Jun-1933 Referring Provider:  Delphina Cahill, MD  Encounter Date: 03/09/2015      PT End of Session - 03/09/15 1504    Visit Number 4   Number of Visits 18   Date for PT Re-Evaluation 03/21/15   Authorization Type [employer] Medicare   Authorization Time Period 02/28/15 to 04/30/15   Authorization - Visit Number 4   Authorization - Number of Visits 10   PT Start Time 7353   PT Stop Time 1515   PT Time Calculation (min) 40 min   Activity Tolerance Patient tolerated treatment well   Behavior During Therapy [facility] for tasks assessed/performed      Past Medical History  Diagnosis Date  . Type 2 diabetes mellitus   . Hyperlipidemia   . Spinal stenosis   . Depression   . Glaucoma   . Nephrolithiasis   . Hydrocephalus   . Coronary atherosclerosis of native coronary artery     Multivessel status post CABG    Past Surgical History  Procedure Laterality Date  . Back surgery    . Kyphoplasty    . Foot surgery    . Ventriculoperitoneal shunt    . Appendectomy    . Coronary artery bypass graft  11/24/1991    SVG to OM1, SVG to RCA    There were no vitals filed for this visit.  Visit Diagnosis:  Abnormality of gait  Poor posture  Generalized weakness  Falls frequently  Proximal muscle weakness  Decreased functional mobility and endurance      Subjective Assessment - 03/09/15 1454    Symptoms Pt states she fell in her kitchen floor last week and had to call the paramedics to get her out of the floor.  States her Lt hip is sore from her fall.  Reports compliance with her HEP (STS, UE exercises are the only ones she could recall)   Currently in Pain? No/denies                       [facility] Adult PT Treatment/Exercise -  03/09/15 1439    Lumbar Exercises: Aerobic   Stationary Bike nustep hills #2 level 2 UE/LE 8 minutes   Lumbar Exercises: Seated   Sit to Stand 10 reps   Sit to Stand Limitations no UE's from mat   Other Seated Lumbar Exercises Gt train   Lumbar Exercises: Supine   Bent Knee Raise 10 reps   Dead Bug 10 reps   Bridge 15 reps   Bridge Limitations standard    Straight Leg Raise 15 reps   Lumbar Exercises: Sidelying   Hip Abduction 15 reps   Hip Abduction Limitations manual facilitation for proper form                PT Education - 03/08/15 0907    Education provided Yes   Education Details education regarding importance of taking her time with gait to allow for improved mechanics and increased safety   Person(s) Educated Patient   Methods Explanation   Comprehension Verbalized understanding          PT Short Term Goals - 02/28/15 1212    PT SHORT TERM GOAL #1   Title Patient will complain of no more than 1/10 pain in her  neck in order to reduce overall discomfort and allow patient to participate in functional tasks   Time 3   Period Weeks   Status New   PT SHORT TERM GOAL #2   Title Patient will require cues no more than 25% of the time for good posture and proper biomechanics    Time 3   Period Weeks   Status New   PT SHORT TERM GOAL #3   Title Patient will demonstrate muscle strength of at least 3+/5 in proximal hip and core musculature in order to increase stability and improve overall gait and mobility patterns   Time 3   Period Weeks   Status New   PT SHORT TERM GOAL #4   Title Patient will demonstrate the ability to consistently ambulate at least 731ft on level surfaces with rollator and improved gait mechanics including improved step length, improved posture, equal step lengths, improved bilateral foot clearance, and minimal fatigue           PT Long Term Goals - 02/28/15 1214    PT LONG TERM GOAL #1   Title Patient will score at least 48 on Berg  balance test in order to demonstrate improved overall balance skills and reduce overall fall risk   Time 6   Period Weeks   Status New   PT LONG TERM GOAL #2   Title Patient will be independent in correcting her posture and will be able to verbally explain the relationship of upright posture to improved function during functional tasks   Time 6   Period Weeks   Status New   PT LONG TERM GOAL #3   Title Patient will demonstrate the ability to ambulate at least 1565ft consistently and with good mechancis including equal step length, full TKE bilaterally, improved bilateral hip rotaion, improved bilateral foot clearance, upright posture, improved gait speed, and no fatigue   Time 6   Period Weeks   Status New   PT LONG TERM GOAL #4   Title Patient will demonstrate the ability to ambulate safely within tight spaces and over uneven surfaces with minimal risk of falling   Time 6   Period Weeks   Status New   PT LONG TERM GOAL #5   Title Patient will demonstrate the ability to safely and efficiently perform transfers from surfaces of various heights and demonstrate good safety awareness throughout in order to reduce overall fall risk at home   Time 6   Period Weeks   Status New   Additional Long Term Goals   Additional Long Term Goals Yes   PT LONG TERM GOAL #6   Title Patient will demonstrate a grade of at least 4/5 in bilateral proximal hip and core musculature in order to increase stabllity and reduce overall fall risk   Time 6   Period Weeks   Status New               Plan - 03/09/15 1506    Clinical Impression Statement Continued focus on increasing strength, coordination, gait mechanics and actvitiy tolerance.   Pt with difficulty coordinating UE/LE movements with mat exercises but able to complete with therapist facilitation.  PT also requires cues to complete exercises slowly and more controlled.  added nustep at EOS to work on [place] coordination and  actvitiy tolerance.   PT was able to complete 8 minutes  without rest break with average of 72 SPM.   PT Next Visit Plan Progress gait actvities to increase cadence.  Continue working on increasing actvitiy tolerance.    Consulted and Agree with Plan of Care Patient        Problem List Patient Active Problem List   Diagnosis Date Noted  . Coronary atherosclerosis of native coronary artery 09/11/2013  . Mixed hyperlipidemia 09/11/2013  . Carotid artery occlusion without infarction 09/11/2013  . Type 2 diabetes mellitus 09/11/2013  . Difficulty in walking(719.7) 06/24/2013    Teena Irani, PTA/CLT [id number] 03/09/2015, 3:10 PM  [place] [address] [place], [place], [address] Phone: [phone]   Fax:  [phone]

## 2015-03-11 ENCOUNTER — Ambulatory Visit (HOSPITAL_COMMUNITY): Payer: Medicare Other | Admitting: Physical Therapy

## 2015-03-11 DIAGNOSIS — R293 Abnormal posture: Secondary | ICD-10-CM

## 2015-03-11 DIAGNOSIS — R269 Unspecified abnormalities of gait and mobility: Secondary | ICD-10-CM | POA: Diagnosis not present

## 2015-03-11 DIAGNOSIS — M6281 Muscle weakness (generalized): Secondary | ICD-10-CM

## 2015-03-11 DIAGNOSIS — R296 Repeated falls: Secondary | ICD-10-CM

## 2015-03-11 DIAGNOSIS — Z7409 Other reduced mobility: Secondary | ICD-10-CM

## 2015-03-11 DIAGNOSIS — R531 Weakness: Secondary | ICD-10-CM

## 2015-03-11 NOTE — Therapy (Signed)
[place], [place], [address] Phone: [phone]   Fax:  [phone]  Physical Therapy Treatment  Patient Details  Name: [name] MRN: [id number] Date of Birth: January 14, 1933 Referring Provider:  Delphina Cahill, MD  Encounter Date: 03/11/2015      PT End of Session - 03/11/15 1641    Visit Number 5   Number of Visits 18   Date for PT Re-Evaluation 03/21/15   Authorization Time Period 5   Authorization - Visit Number 5   Authorization - Number of Visits 18   PT Start Time [phone]   PT Stop Time 1017   PT Time Calculation (min) 40 min   Equipment Utilized During Treatment Gait belt      Past Medical History  Diagnosis Date  . Type 2 diabetes mellitus   . Hyperlipidemia   . Spinal stenosis   . Depression   . Glaucoma   . Nephrolithiasis   . Hydrocephalus   . Coronary atherosclerosis of native coronary artery     Multivessel status post CABG    Past Surgical History  Procedure Laterality Date  . Back surgery    . Kyphoplasty    . Foot surgery    . Ventriculoperitoneal shunt    . Appendectomy    . Coronary artery bypass graft  11/24/1991    SVG to OM1, SVG to RCA    There were no vitals filed for this visit.  Visit Diagnosis:  Abnormality of gait  Poor posture  Generalized weakness  Falls frequently  Proximal muscle weakness  Decreased functional mobility and endurance      Subjective Assessment - 03/11/15 0937    Symptoms Pt states that she is doing her exercies at home.  She is sore but no pain   Currently in Pain? No/denies               [facility] Adult PT Treatment/Exercise - 03/11/15 0001    Ambulation/Gait   Ambulation/Gait Yes   Ambulation/Gait Assistance 4: Min guard   Ambulation Distance (Feet) 180 Feet  workng on increasing stride length slowing steppage down   Assistive device 4-wheeled walker   Lumbar Exercises: Stretches   Active Hamstring Stretch --  2  minutes with therapist assist,(push down opposite leg too)   Single Knee to Chest Stretch --  2 minutes B   Lower Trunk Rotation --  with opposite arm rotation x 10    Lumbar Exercises: Standing   Other Standing Lumbar Exercises Sit to stand no handsx 10; marching x 10; narrow base of supprrt with trunk rotation x 15; 3-D hip excursion x 5    Other Standing Lumbar Exercises Weight shifting lateral and anterior/posterior; attempted weight shifting with step forward but patient had difficulty with coordination of  task despite multimodal cues   Lumbar Exercises: Seated   Other Seated Lumbar Exercises heel raise/toe raise x 10                  PT Short Term Goals - 02/28/15 1212    PT SHORT TERM GOAL #1   Title Patient will complain of no more than 1/10 pain in her neck in order to reduce overall discomfort and allow patient to participate in functional tasks   Time 3   Period Weeks   Status New   PT SHORT TERM GOAL #2   Title Patient will require cues no more than 25% of the time for good posture  and proper biomechanics    Time 3   Period Weeks   Status New   PT SHORT TERM GOAL #3   Title Patient will demonstrate muscle strength of at least 3+/5 in proximal hip and core musculature in order to increase stability and improve overall gait and mobility patterns   Time 3   Period Weeks   Status New   PT SHORT TERM GOAL #4   Title Patient will demonstrate the ability to consistently ambulate at least 719ft on level surfaces with rollator and improved gait mechanics including improved step length, improved posture, equal step lengths, improved bilateral foot clearance, and minimal fatigue           PT Long Term Goals - 02/28/15 1214    PT LONG TERM GOAL #1   Title Patient will score at least 48 on Berg balance test in order to demonstrate improved overall balance skills and reduce overall fall risk   Time 6   Period Weeks   Status New   PT LONG TERM GOAL #2   Title  Patient will be independent in correcting her posture and will be able to verbally explain the relationship of upright posture to improved function during functional tasks   Time 6   Period Weeks   Status New   PT LONG TERM GOAL #3   Title Patient will demonstrate the ability to ambulate at least 1530ft consistently and with good mechancis including equal step length, full TKE bilaterally, improved bilateral hip rotaion, improved bilateral foot clearance, upright posture, improved gait speed, and no fatigue   Time 6   Period Weeks   Status New   PT LONG TERM GOAL #4   Title Patient will demonstrate the ability to ambulate safely within tight spaces and over uneven surfaces with minimal risk of falling   Time 6   Period Weeks   Status New   PT LONG TERM GOAL #5   Title Patient will demonstrate the ability to safely and efficiently perform transfers from surfaces of various heights and demonstrate good safety awareness throughout in order to reduce overall fall risk at home   Time 6   Period Weeks   Status New   Additional Long Term Goals   Additional Long Term Goals Yes   PT LONG TERM GOAL #6   Title Patient will demonstrate a grade of at least 4/5 in bilateral proximal hip and core musculature in order to increase stabllity and reduce overall fall risk   Time 6   Period Weeks   Status New               Plan - 03/11/15 1642    Clinical Impression Statement Focus on increasing step length and decreasing cadance of gt.  Pt does show some improvement when verbally cued but when left to her own she will continue with her festering gt pattern.  Pt able to complete today's treatment with only  two short rest break showing improved activtiy tolerance.    PT Next Visit Plan Progress gait actvities to increase cadence.  Continue working on increasing actvitiy tolerance as well as trunk mobily for improved balance.        Problem List Patient Active Problem List   Diagnosis Date  Noted  . Coronary atherosclerosis of native coronary artery 09/11/2013  . Mixed hyperlipidemia 09/11/2013  . Carotid artery occlusion without infarction 09/11/2013  . Type 2 diabetes mellitus 09/11/2013  . Difficulty in walking(719.7) 06/24/2013    Caren Griffins  Joneen Caraway PT  03/11/2015, 4:46 PM  [place] [address] [place], [place], [address] Phone: [phone]   Fax:  [phone]

## 2015-03-14 ENCOUNTER — Ambulatory Visit (HOSPITAL_COMMUNITY): Payer: Medicare Other | Admitting: Physical Therapy

## 2015-03-14 DIAGNOSIS — R269 Unspecified abnormalities of gait and mobility: Secondary | ICD-10-CM

## 2015-03-14 DIAGNOSIS — Z7409 Other reduced mobility: Secondary | ICD-10-CM

## 2015-03-14 DIAGNOSIS — M6281 Muscle weakness (generalized): Secondary | ICD-10-CM | POA: Diagnosis not present

## 2015-03-14 DIAGNOSIS — R296 Repeated falls: Secondary | ICD-10-CM

## 2015-03-14 DIAGNOSIS — R293 Abnormal posture: Secondary | ICD-10-CM | POA: Diagnosis not present

## 2015-03-14 DIAGNOSIS — R531 Weakness: Secondary | ICD-10-CM

## 2015-03-14 NOTE — Therapy (Signed)
[place], [place], [address] Phone: [phone]   Fax:  [phone]  Physical Therapy Treatment  Patient Details  Name: [name] MRN: [id number] Date of Birth: 1933/05/14 Referring Provider:  Delphina Cahill, MD  Encounter Date: 03/14/2015      PT End of Session - 03/14/15 1623    Visit Number 6   Number of Visits 18   Date for PT Re-Evaluation 03/21/15   Authorization Type [employer] Medicare   Authorization - Visit Number 6   Authorization - Number of Visits 18   Equipment Utilized During Treatment Gait belt   Activity Tolerance Patient limited by fatigue;Patient limited by pain;Other (comment)  peripheral neuropathy   Behavior During Therapy [facility] for tasks assessed/performed      Past Medical History  Diagnosis Date  . Type 2 diabetes mellitus   . Hyperlipidemia   . Spinal stenosis   . Depression   . Glaucoma   . Nephrolithiasis   . Hydrocephalus   . Coronary atherosclerosis of native coronary artery     Multivessel status post CABG    Past Surgical History  Procedure Laterality Date  . Back surgery    . Kyphoplasty    . Foot surgery    . Ventriculoperitoneal shunt    . Appendectomy    . Coronary artery bypass graft  11/24/1991    SVG to OM1, SVG to RCA    There were no vitals filed for this visit.  Visit Diagnosis:  Abnormality of gait  Poor posture  Generalized weakness  Falls frequently  Proximal muscle weakness  Decreased functional mobility and endurance      Subjective Assessment - 03/14/15 1612    Symptoms Patient states she is having "fire on my feet from my sugars today", likely due to peripheral neuropathy. Patient also states that she really did not sleep at all last night and that today has been a hard physical day so far.    Pertinent History Patient went to see MD; MD states that he thinks most of trouble is coming from neck and shoulders. Shunt in head that runs down  through neck. Has been years. Does have a history of significant ankle injuries that occurred 15 years ago.    Currently in Pain? No/denies                       [facility] Adult PT Treatment/Exercise - 03/14/15 0001    Ambulation/Gait   Ambulation/Gait Yes   Ambulation/Gait Assistance 4: Min guard   Ambulation Distance (Feet) --  60ft x2, 28ft   Assistive device 4-wheeled walker   Gait Comments Patient demonstrated very poor gait mechanics today despite multimodal cues from PT; also fatigued quickly during gait today. Attempted to use metronome during gait to assist in cadence and pacing however patient did not appear to understand purpose of using instrument in activity.    Lumbar Exercises: Standing   Heel Raises 15 reps   Heel Raises Limitations bilateral HHA   Functional Squats 10 reps   Functional Squats Limitations poor form despite multimodal cues from PT   Other Standing Lumbar Exercises Sit to stand without hands from mat table, 1x5 each side in staggered stance   Other Standing Lumbar Exercises Attempted multiple activities involving weight shifting and stepping tasks in multiple directions. Attempted different techniques and multimodal cuing however patient had difficulty with all approaches towards this activity today. Attempted to use metronome to  assist patient in appropriate timing and sequencing however patient did not appear to understand purpose of technique at this time.    Lumbar Exercises: Seated   Long Arc Quad on Chair Strengthening;Both;1 set;10 reps   LAQ on Chair Weights (lbs) 0   LAQ on Chair Limitations 3 second hold at knee extension   Lumbar Exercises: Supine   Bridge 15 reps   Bridge Limitations standard   Straight Leg Raise 10 reps   Lumbar Exercises: Sidelying   Hip Abduction 10 reps   Hip Abduction Limitations manual facilitation for improved form                PT Education - 03/14/15 1618    Education provided Yes   Education  Details Education regarding the importance of proper mechanics, weight shifting, and timing during gait   Person(s) Educated Patient   Methods Explanation   Comprehension Verbalized understanding;Need further instruction          PT Short Term Goals - 02/28/15 1212    PT SHORT TERM GOAL #1   Title Patient will complain of no more than 1/10 pain in her neck in order to reduce overall discomfort and allow patient to participate in functional tasks   Time 3   Period Weeks   Status New   PT SHORT TERM GOAL #2   Title Patient will require cues no more than 25% of the time for good posture and proper biomechanics    Time 3   Period Weeks   Status New   PT SHORT TERM GOAL #3   Title Patient will demonstrate muscle strength of at least 3+/5 in proximal hip and core musculature in order to increase stability and improve overall gait and mobility patterns   Time 3   Period Weeks   Status New   PT SHORT TERM GOAL #4   Title Patient will demonstrate the ability to consistently ambulate at least 737ft on level surfaces with rollator and improved gait mechanics including improved step length, improved posture, equal step lengths, improved bilateral foot clearance, and minimal fatigue           PT Long Term Goals - 02/28/15 1214    PT LONG TERM GOAL #1   Title Patient will score at least 48 on Berg balance test in order to demonstrate improved overall balance skills and reduce overall fall risk   Time 6   Period Weeks   Status New   PT LONG TERM GOAL #2   Title Patient will be independent in correcting her posture and will be able to verbally explain the relationship of upright posture to improved function during functional tasks   Time 6   Period Weeks   Status New   PT LONG TERM GOAL #3   Title Patient will demonstrate the ability to ambulate at least 1541ft consistently and with good mechancis including equal step length, full TKE bilaterally, improved bilateral hip rotaion, improved  bilateral foot clearance, upright posture, improved gait speed, and no fatigue   Time 6   Period Weeks   Status New   PT LONG TERM GOAL #4   Title Patient will demonstrate the ability to ambulate safely within tight spaces and over uneven surfaces with minimal risk of falling   Time 6   Period Weeks   Status New   PT LONG TERM GOAL #5   Title Patient will demonstrate the ability to safely and efficiently perform transfers from surfaces of various heights and  demonstrate good safety awareness throughout in order to reduce overall fall risk at home   Time 6   Period Weeks   Status New   Additional Long Term Goals   Additional Long Term Goals Yes   PT LONG TERM GOAL #6   Title Patient will demonstrate a grade of at least 4/5 in bilateral proximal hip and core musculature in order to increase stabllity and reduce overall fall risk   Time 6   Period Weeks   Status New               Plan - 03/14/15 1624    Clinical Impression Statement Patient had difficulty with today's session in general, stating that her feet were really bothering her with burning today, and that she had not slept well last night. Performed proximal strengthening tasks and directed main focus of session on weight shifting tasks in order to assist patient in improving gait; however patient had difficulty performing tasks correctly even with multimodal cues. Attempted to use metronome to assist in task performance and pacing however patient did not appear to understand purpose of instrument. Difficulty following cues from PT during majority of session, however on occasions when patient is able to better follow cues she does show improved gait mechanics.   Pt will benefit from skilled therapeutic intervention in order to improve on the following deficits Abnormal gait;Decreased endurance;Decreased knowledge of precautions;Impaired perceived functional ability;Improper body mechanics;Decreased activity tolerance;Decreased  strength;Impaired flexibility;Postural dysfunction;Decreased balance;Decreased mobility;Difficulty walking;Decreased range of motion;Pain;Decreased coordination;Decreased safety awareness   Rehab Potential Good   PT Frequency 3x / week   PT Duration 6 weeks   PT Treatment/Interventions ADLs/Self Care Home Management;Gait training;Neuromuscular re-education;Stair training;Functional mobility training;Patient/family education;Therapeutic activities;Electrical Stimulation;Therapeutic exercise;Manual techniques;Energy conservation;Moist Heat;DME Instruction;Balance training   PT Next Visit Plan Progress gait actvities to increase cadence.  Continue working on increasing actvitiy tolerance as well as trunk mobily for improved balance. Re-trial metronome to assist with gait.    PT Home Exercise Plan given   Consulted and Agree with Plan of Care Patient        Problem List Patient Active Problem List   Diagnosis Date Noted  . Coronary atherosclerosis of native coronary artery 09/11/2013  . Mixed hyperlipidemia 09/11/2013  . Carotid artery occlusion without infarction 09/11/2013  . Type 2 diabetes mellitus 09/11/2013  . Difficulty in walking(719.7) 06/24/2013    Deniece Ree PT, DPT [phone]  [place] [address] [place], [place], [address] Phone: [phone]   Fax:  [phone]

## 2015-03-16 ENCOUNTER — Ambulatory Visit (HOSPITAL_COMMUNITY): Payer: Medicare Other

## 2015-03-16 DIAGNOSIS — R293 Abnormal posture: Secondary | ICD-10-CM

## 2015-03-16 DIAGNOSIS — Z7409 Other reduced mobility: Secondary | ICD-10-CM | POA: Diagnosis not present

## 2015-03-16 DIAGNOSIS — R296 Repeated falls: Secondary | ICD-10-CM

## 2015-03-16 DIAGNOSIS — R269 Unspecified abnormalities of gait and mobility: Secondary | ICD-10-CM | POA: Diagnosis not present

## 2015-03-16 DIAGNOSIS — R531 Weakness: Secondary | ICD-10-CM

## 2015-03-16 DIAGNOSIS — M6281 Muscle weakness (generalized): Secondary | ICD-10-CM

## 2015-03-16 NOTE — Therapy (Addendum)
[place], [place], [address] Phone: [phone]   Fax:  [phone]  Physical Therapy Treatment  Patient Details  Name: [name] MRN: [id number] Date of Birth: 1933/06/09 Referring Provider:  Delphina Cahill, MD  Encounter Date: 03/16/2015      PT End of Session - 03/16/15 1744    Visit Number 7   Number of Visits 18   Date for PT Re-Evaluation 03/21/15   Authorization Type [employer] Medicare   Authorization - Visit Number 7   Authorization - Number of Visits 10   PT Start Time 1941   PT Stop Time 1736   PT Time Calculation (min) 48 min   Activity Tolerance Patient tolerated treatment well   Behavior During Therapy [facility] for tasks assessed/performed      Past Medical History  Diagnosis Date  . Type 2 diabetes mellitus   . Hyperlipidemia   . Spinal stenosis   . Depression   . Glaucoma   . Nephrolithiasis   . Hydrocephalus   . Coronary atherosclerosis of native coronary artery     Multivessel status post CABG    Past Surgical History  Procedure Laterality Date  . Back surgery    . Kyphoplasty    . Foot surgery    . Ventriculoperitoneal shunt    . Appendectomy    . Coronary artery bypass graft  11/24/1991    SVG to OM1, SVG to RCA    There were no vitals filed for this visit.  Visit Diagnosis:  Abnormality of gait  Poor posture  Generalized weakness  Falls frequently  Proximal muscle weakness  Decreased functional mobility and endurance      Subjective Assessment - 03/16/15 1729    Symptoms Pt stated she is having a good day, her feet are no longer on fire.  Reports she has been a hard worker her whole life and just go, go, go and is having difficulty slowing down.  Reports a fall 2 weeks ago having to call the paramedics to help her up, bruise on her hip now.  No reports of pain today.   Currently in Pain? No/denies            [facility] PT Assessment - 03/16/15 0001    Assessment    Next MD Visit Dr. Nevada Crane unscheduled             [facility] Adult PT Treatment/Exercise - 03/16/15 0001    Ambulation/Gait   Ambulation/Gait Yes   Ambulation/Gait Assistance 4: Min guard   Ambulation Distance (Feet) 775 Feet   Assistive device 4-wheeled walker   Exercises   Exercises Knee/Hip   Knee/Hip Exercises: Aerobic   Stationary Bike Nustep hill 3, resistnace 3 x 75min   Knee/Hip Exercises: Standing   Heel Raises 2 sets;10 reps   Heel Raises Limitations Toe raises 2x 10   Gait Training Inside // bars focus on LARGE steps with turns in 3 steps   Other Standing Knee Exercises Weight shifting 10x with therapist facilitation; frontal plane and split stance sagital with heel to toe;     Other Standing Knee Exercises Rotation to improve hip mobility              PT Short Term Goals - 03/16/15 1830    PT SHORT TERM GOAL #1   Title Patient will complain of no more than 1/10 pain in her neck in order to reduce overall discomfort and allow patient to participate in  functional tasks   Status On-going   PT SHORT TERM GOAL #2   Title Patient will require cues no more than 25% of the time for good posture and proper biomechanics    Status On-going   PT SHORT TERM GOAL #3   Title Patient will demonstrate muscle strength of at least 3+/5 in proximal hip and core musculature in order to increase stability and improve overall gait and mobility patterns   Status On-going   PT SHORT TERM GOAL #4   Title Patient will demonstrate the ability to consistently ambulate at least 737ft on level surfaces with rollator and improved gait mechanics including improved step length, improved posture, equal step lengths, improved bilateral foot clearance, and minimal fatigue   Status On-going           PT Long Term Goals - 03/16/15 1831    PT LONG TERM GOAL #1   Title Patient will score at least 48 on Berg balance test in order to demonstrate improved overall balance skills and reduce overall  fall risk   PT LONG TERM GOAL #2   Title Patient will be independent in correcting her posture and will be able to verbally explain the relationship of upright posture to improved function during functional tasks   PT LONG TERM GOAL #3   Title Patient will demonstrate the ability to ambulate at least 1556ft consistently and with good mechancis including equal step length, full TKE bilaterally, improved bilateral hip rotaion, improved bilateral foot clearance, upright posture, improved gait speed, and no fatigue   PT LONG TERM GOAL #4   Title Patient will demonstrate the ability to ambulate safely within tight spaces and over uneven surfaces with minimal risk of falling   PT LONG TERM GOAL #5   Title Patient will demonstrate the ability to safely and efficiently perform transfers from surfaces of various heights and demonstrate good safety awareness throughout in order to reduce overall fall risk at home   PT LONG TERM GOAL #6   Title Patient will demonstrate a grade of at least 4/5 in bilateral proximal hip and core musculature in order to increase stabllity and reduce overall fall risk               Plan - 03/16/15 1745    Clinical Impression Statement Session focus on thinking large with gait mechanics.  Added weight shifting in split stance with heel to toe to improve gait mechanics as well as weight shifting in frontal and transfer plane to improve hip mobility.  Pt able to ambulate with "large" steps for 15 feet prior shuffling mechanics back, pt able to demonstrate stopping and starting again with increased stride length with min cueing required prior shuffling..  Pt limited by fatigue  requiring 2 rest breaks through session.     PT Next Visit Plan Progress gait actvities to increase cadence.  Continue working on increasing actvitiy tolerance as well as trunk mobily for improved balance. Re-trial metronome to assist with gait.         Problem List Patient Active Problem List    Diagnosis Date Noted  . Coronary atherosclerosis of native coronary artery 09/11/2013  . Mixed hyperlipidemia 09/11/2013  . Carotid artery occlusion without infarction 09/11/2013  . Type 2 diabetes mellitus 09/11/2013  . Difficulty in walking(719.7) 06/24/2013   Ihor [place], [place]; [place], [place]   Aldona Lento 03/16/2015, 6:44 PM  [place] [address] [place], [place], [address]  Phone: [phone]   Fax:  [phone]

## 2015-03-17 ENCOUNTER — Ambulatory Visit (HOSPITAL_COMMUNITY): Payer: Medicare Other | Admitting: Physical Therapy

## 2015-03-17 DIAGNOSIS — M6281 Muscle weakness (generalized): Secondary | ICD-10-CM

## 2015-03-17 DIAGNOSIS — R269 Unspecified abnormalities of gait and mobility: Secondary | ICD-10-CM

## 2015-03-17 DIAGNOSIS — Z7409 Other reduced mobility: Secondary | ICD-10-CM | POA: Diagnosis not present

## 2015-03-17 DIAGNOSIS — R531 Weakness: Secondary | ICD-10-CM

## 2015-03-17 DIAGNOSIS — R296 Repeated falls: Secondary | ICD-10-CM

## 2015-03-17 DIAGNOSIS — R293 Abnormal posture: Secondary | ICD-10-CM | POA: Diagnosis not present

## 2015-03-17 NOTE — Therapy (Signed)
[place], [place], [address] Phone: [phone]   Fax:  [phone]  Physical Therapy Treatment  Patient Details  Name: [name] MRN: [id number] Date of Birth: [birth year] Referring Provider:  Delphina Cahill, MD  Encounter Date: 03/17/2015      PT End of Session - 03/17/15 1816    Visit Number 8   Number of Visits 18   Date for PT Re-Evaluation 03/21/15   Authorization Type [employer] Medicare   Authorization - Visit Number 8   Authorization - Number of Visits 10   PT Start Time 1700   PT Stop Time 1758   PT Time Calculation (min) 58 min   Activity Tolerance Patient tolerated treatment well   Behavior During Therapy [facility] for tasks assessed/performed      Past Medical History  Diagnosis Date  . Type 2 diabetes mellitus   . Hyperlipidemia   . Spinal stenosis   . Depression   . Glaucoma   . Nephrolithiasis   . Hydrocephalus   . Coronary atherosclerosis of native coronary artery     Multivessel status post CABG    Past Surgical History  Procedure Laterality Date  . Back surgery    . Kyphoplasty    . Foot surgery    . Ventriculoperitoneal shunt    . Appendectomy    . Coronary artery bypass graft  11/24/1991    SVG to OM1, SVG to RCA    There were no vitals filed for this visit.  Visit Diagnosis:  Abnormality of gait  Poor posture  Generalized weakness  Falls frequently  Proximal muscle weakness  Decreased functional mobility and endurance      Subjective Assessment - 03/17/15 1801    Symptoms Patient states she is sore after yesterday but notes difficulty performing prolonged walks due to weakness and scared of falls.    Currently in Pain? No/denies            [facility] Adult PT Treatment/Exercise - 03/17/15 0001    Knee/Hip Exercises: Stretches   Hip Flexor Stretch 3 reps;20 seconds   Piriformis Stretch 3 reps;20 seconds   Gastroc Stretch 3 reps;20 seconds   Knee/Hip Exercises:  Standing   Heel Raises 20 reps   Heel Raises Limitations Toe raises 2x 10   Forward Lunges Limitations 3 sets, 15 reps each set to: 8" box, 4" box, and floor with min assist for safety and increasing patient's confidence.    Functional Squat Limitations 10 reps in neutral position from low table, 5 reps split stance each.    Gait Training Inside // bars focus on LARGE steps with turns in 3 steps, walking outside of parallel bars 226ft no UE support with therapist CGA.    Other Standing Knee Exercises Agility ladder cuing for step length 4x (2 RT)           PT Education - 03/17/15 1815    Education provided Yes   Education Details increasing reps of HEP and increasing depth of sit to stand from a low couch.           PT Short Term Goals - 03/16/15 1830    PT SHORT TERM GOAL #1   Title Patient will complain of no more than 1/10 pain in her neck in order to reduce overall discomfort and allow patient to participate in functional tasks   Status On-going   PT SHORT TERM GOAL #2   Title Patient will require cues  no more than 25% of the time for good posture and proper biomechanics    Status On-going   PT SHORT TERM GOAL #3   Title Patient will demonstrate muscle strength of at least 3+/5 in proximal hip and core musculature in order to increase stability and improve overall gait and mobility patterns   Status On-going   PT SHORT TERM GOAL #4   Title Patient will demonstrate the ability to consistently ambulate at least 767ft on level surfaces with rollator and improved gait mechanics including improved step length, improved posture, equal step lengths, improved bilateral foot clearance, and minimal fatigue   Status On-going           PT Long Term Goals - 03/16/15 1831    PT LONG TERM GOAL #1   Title Patient will score at least 48 on Berg balance test in order to demonstrate improved overall balance skills and reduce overall fall risk   PT LONG TERM GOAL #2   Title Patient will  be independent in correcting her posture and will be able to verbally explain the relationship of upright posture to improved function during functional tasks   PT LONG TERM GOAL #3   Title Patient will demonstrate the ability to ambulate at least 1534ft consistently and with good mechancis including equal step length, full TKE bilaterally, improved bilateral hip rotaion, improved bilateral foot clearance, upright posture, improved gait speed, and no fatigue   PT LONG TERM GOAL #4   Title Patient will demonstrate the ability to ambulate safely within tight spaces and over uneven surfaces with minimal risk of falling   PT LONG TERM GOAL #5   Title Patient will demonstrate the ability to safely and efficiently perform transfers from surfaces of various heights and demonstrate good safety awareness throughout in order to reduce overall fall risk at home   PT LONG TERM GOAL #6   Title Patient will demonstrate a grade of at least 4/5 in bilateral proximal hip and core musculature in order to increase stabllity and reduce overall fall risk               Plan - 03/17/15 1816    Clinical Impression Statement Session focused on standing tolerance with all activiites perfomed in standing with only 2 short seated rest breaks. Session also focused on decreasing patient dependence of walker for ambulation with therapist providing min to contact guard assist. Patient requires functional strenghtieng and power training to improve confidence with gait as patient dmeonstrates quickness to fatigue and for her hamstrings to on Rt to give out secondary to a long history of injury to Rt LE. At ed of session patient demonstrated increased step length   PT Next Visit Plan Place patient in body weight support system or use therapiast min assist to CGA to perform all standing exercises: Lunges, squats, calf raises.         Problem List Patient Active Problem List   Diagnosis Date Noted  . Coronary  atherosclerosis of native coronary artery 09/11/2013  . Mixed hyperlipidemia 09/11/2013  . Carotid artery occlusion without infarction 09/11/2013  . Type 2 diabetes mellitus 09/11/2013  . Difficulty in walking(719.7) 06/24/2013   Devona Konig PT DPT [place], [place], [address] Phone: [phone]   Fax:  [phone]

## 2015-03-21 ENCOUNTER — Ambulatory Visit (HOSPITAL_COMMUNITY): Payer: Medicare Other | Attending: Internal Medicine | Admitting: Physical Therapy

## 2015-03-21 DIAGNOSIS — R293 Abnormal posture: Secondary | ICD-10-CM | POA: Diagnosis not present

## 2015-03-21 DIAGNOSIS — R269 Unspecified abnormalities of gait and mobility: Secondary | ICD-10-CM | POA: Diagnosis not present

## 2015-03-21 DIAGNOSIS — Z7409 Other reduced mobility: Secondary | ICD-10-CM

## 2015-03-21 DIAGNOSIS — R531 Weakness: Secondary | ICD-10-CM

## 2015-03-21 DIAGNOSIS — M6281 Muscle weakness (generalized): Secondary | ICD-10-CM | POA: Insufficient documentation

## 2015-03-21 DIAGNOSIS — R296 Repeated falls: Secondary | ICD-10-CM

## 2015-03-21 NOTE — Therapy (Signed)
[place], [place], [address] Phone: [phone]   Fax:  [phone]  Physical Therapy Treatment  Patient Details  Name: [name] MRN: [id number] Date of Birth: 18-Apr-1933 Referring Provider:  Delphina Cahill, MD  Encounter Date: 03/21/2015      PT End of Session - 03/21/15 1223    Visit Number 9   Number of Visits 18   Date for PT Re-Evaluation 03/23/15   Authorization Type [employer] Medicare   Authorization - Visit Number 9   Authorization - Number of Visits 10   PT Start Time 0935   PT Stop Time 1020   PT Time Calculation (min) 45 min   Equipment Utilized During Treatment Gait belt   Activity Tolerance Patient tolerated treatment well   Behavior During Therapy [facility] for tasks assessed/performed      Past Medical History  Diagnosis Date  . Type 2 diabetes mellitus   . Hyperlipidemia   . Spinal stenosis   . Depression   . Glaucoma   . Nephrolithiasis   . Hydrocephalus   . Coronary atherosclerosis of native coronary artery     Multivessel status post CABG    Past Surgical History  Procedure Laterality Date  . Back surgery    . Kyphoplasty    . Foot surgery    . Ventriculoperitoneal shunt    . Appendectomy    . Coronary artery bypass graft  11/24/1991    SVG to OM1, SVG to RCA    There were no vitals filed for this visit.  Visit Diagnosis:  Abnormality of gait  Poor posture  Generalized weakness  Falls frequently  Proximal muscle weakness  Decreased functional mobility and endurance      Subjective Assessment - 03/21/15 1008    Subjective Pt states that she contiues to be sore.    Currently in Pain? No/denies   Pain Score 0-No pain                Heel raise x 10; mini squat x 10;  weight shifting all in //       Balance Exercises - 03/21/15 1009    Balance Exercises: Standing   Stepping Strategy Anterior;Lateral   Retro Gait 1 rep   Cone Rotation Solid  surface;Right turn;Left turn   Marching Limitations --  15 x    Other Standing Exercises toe raises x 15/ 3 -D hip excursion x 10; slant board x 30 seconds x 3    Overall Comments --  nustep hills 3 L4 x 10:00   Balance Exercises: Seated   Other Seated Exercises LAQ 5 # x 15 Bilaterally           PT Education - 03/21/15 1222    Education provided Yes   Education Details Proper gt - increasing stride length, decreasing cadance and heel strike first    Person(s) Educated Patient   Methods Explanation   Comprehension Verbalized understanding;Returned demonstration          PT Short Term Goals - 03/16/15 1830    PT SHORT TERM GOAL #1   Title Patient will complain of no more than 1/10 pain in her neck in order to reduce overall discomfort and allow patient to participate in functional tasks   Status On-going   PT SHORT TERM GOAL #2   Title Patient will require cues no more than 25% of the time for good posture and proper biomechanics    Status On-going  PT SHORT TERM GOAL #3   Title Patient will demonstrate muscle strength of at least 3+/5 in proximal hip and core musculature in order to increase stability and improve overall gait and mobility patterns   Status On-going   PT SHORT TERM GOAL #4   Title Patient will demonstrate the ability to consistently ambulate at least 783ft on level surfaces with rollator and improved gait mechanics including improved step length, improved posture, equal step lengths, improved bilateral foot clearance, and minimal fatigue   Status On-going           PT Long Term Goals - 03/16/15 1831    PT LONG TERM GOAL #1   Title Patient will score at least 48 on Berg balance test in order to demonstrate improved overall balance skills and reduce overall fall risk   PT LONG TERM GOAL #2   Title Patient will be independent in correcting her posture and will be able to verbally explain the relationship of upright posture to improved function during  functional tasks   PT LONG TERM GOAL #3   Title Patient will demonstrate the ability to ambulate at least 1553ft consistently and with good mechancis including equal step length, full TKE bilaterally, improved bilateral hip rotaion, improved bilateral foot clearance, upright posture, improved gait speed, and no fatigue   PT LONG TERM GOAL #4   Title Patient will demonstrate the ability to ambulate safely within tight spaces and over uneven surfaces with minimal risk of falling   PT LONG TERM GOAL #5   Title Patient will demonstrate the ability to safely and efficiently perform transfers from surfaces of various heights and demonstrate good safety awareness throughout in order to reduce overall fall risk at home   PT LONG TERM GOAL #6   Title Patient will demonstrate a grade of at least 4/5 in bilateral proximal hip and core musculature in order to increase stabllity and reduce overall fall risk               Plan - 03/21/15 1224    Clinical Impression Statement Pt improving in gait but continues to need verbal cuing.  Pt needed no rest breaks this session as activity tolerance incrreases as well.  Pt is very motivated to work with therapy.  Added retro gt as well as cone rotation for improved trunck rotation and strength.    PT Next Visit Plan Pt wil need g-code update; recheck mm strength to ensure pt is improving .         Problem List Patient Active Problem List   Diagnosis Date Noted  . Coronary atherosclerosis of native coronary artery 09/11/2013  . Mixed hyperlipidemia 09/11/2013  . Carotid artery occlusion without infarction 09/11/2013  . Type 2 diabetes mellitus 09/11/2013  . Difficulty in walking(719.7) 06/24/2013   Rayetta Humphrey, PT CLT [phone] 03/21/2015, 12:28 PM  [place] [address] [place], [place], [address] Phone: [phone]   Fax:  [phone]

## 2015-03-23 ENCOUNTER — Ambulatory Visit (HOSPITAL_COMMUNITY): Payer: Medicare Other | Admitting: Physical Therapy

## 2015-03-23 DIAGNOSIS — R296 Repeated falls: Secondary | ICD-10-CM

## 2015-03-23 DIAGNOSIS — R269 Unspecified abnormalities of gait and mobility: Secondary | ICD-10-CM | POA: Diagnosis not present

## 2015-03-23 DIAGNOSIS — Z7409 Other reduced mobility: Secondary | ICD-10-CM

## 2015-03-23 DIAGNOSIS — R293 Abnormal posture: Secondary | ICD-10-CM | POA: Diagnosis not present

## 2015-03-23 DIAGNOSIS — M6281 Muscle weakness (generalized): Secondary | ICD-10-CM

## 2015-03-23 DIAGNOSIS — R531 Weakness: Secondary | ICD-10-CM

## 2015-03-23 NOTE — Therapy (Signed)
[place], [place], [address] Phone: [phone]   Fax:  [phone]  Physical Therapy Treatment  Patient Details  Name: [name] MRN: [id number] Date of Birth: 1933/11/11 Referring Provider:  Delphina Cahill, MD  Encounter Date: 03/23/2015      PT End of Session - 03/23/15 1835    Visit Number 10   Number of Visits 18   Date for PT Re-Evaluation 04/13/15   Authorization Type [employer] Medicare   Authorization - Visit Number 10   Authorization - Number of Visits 20   PT Start Time 1600   PT Stop Time 1700   PT Time Calculation (min) 60 min   Equipment Utilized During Treatment Gait belt   Activity Tolerance Patient tolerated treatment well   Behavior During Therapy [facility] for tasks assessed/performed      Past Medical History  Diagnosis Date  . Type 2 diabetes mellitus   . Hyperlipidemia   . Spinal stenosis   . Depression   . Glaucoma   . Nephrolithiasis   . Hydrocephalus   . Coronary atherosclerosis of native coronary artery     Multivessel status post CABG    Past Surgical History  Procedure Laterality Date  . Back surgery    . Kyphoplasty    . Foot surgery    . Ventriculoperitoneal shunt    . Appendectomy    . Coronary artery bypass graft  11/24/1991    SVG to OM1, SVG to RCA    There were no vitals filed for this visit.  Visit Diagnosis:  Abnormality of gait  Poor posture  Generalized weakness  Falls frequently  Proximal muscle weakness  Decreased functional mobility and endurance      Subjective Assessment - 03/23/15 1817    Subjective Pt states she is fearful of falling.  Reports overal soreness but no pain.   Currently in Pain? No/denies            [facility] PT Assessment - 03/23/15 1611    Observation/Other Assessments   Focus on Therapeutic Outcomes (FOTO)  43% limited (was 56% limited)   Strength   Right Shoulder Flexion 4+/5   Right Shoulder ABduction 4/5   Right  Shoulder External Rotation 4/5   Left Shoulder Flexion 4/5   Left Shoulder ABduction 4/5   Left Shoulder Internal Rotation 4/5   Left Shoulder External Rotation 4/5   Right Hip Flexion 5/5  was 2/5   Right Hip ABduction 4/5   Left Hip Flexion 4+/5  was 3-/5   Left Hip ABduction 3/5   Right Knee Flexion 4+/5  was 3-/5   Left Knee Flexion 3+/5  was 3/5   Right Ankle Dorsiflexion 4+/5  was 4-/5   Left Ankle Dorsiflexion 4+/5  was 4-/5   Cervical Flexion 5/5  was 4-/5   Cervical Extension 5/5  was 3+/5   Cervical - Right Side Bend 4+/5  was 4-/5   Cervical - Left Side Bend 4+/5  was 4-/5                   [facility] Adult PT Treatment/Exercise - 03/23/15 1651    Lumbar Exercises: Supine   Bridge 15 reps   Lumbar Exercises: Sidelying   Hip Abduction 10 reps   Hip Abduction Limitations manual facilitation for improved form   Knee/Hip Exercises: Aerobic   Stationary Bike Nustep hill 3, resistnace 3 x 4min  Balance Exercises - 03/23/15 1635    Balance Exercises: Standing   Retro Gait 1 rep   Sidestepping 1 rep   Turning Both             PT Short Term Goals - 03/16/15 1830    PT SHORT TERM GOAL #1   Title Patient will complain of no more than 1/10 pain in her neck in order to reduce overall discomfort and allow patient to participate in functional tasks   Status On-going   PT SHORT TERM GOAL #2   Title Patient will require cues no more than 25% of the time for good posture and proper biomechanics    Status On-going   PT SHORT TERM GOAL #3   Title Patient will demonstrate muscle strength of at least 3+/5 in proximal hip and core musculature in order to increase stability and improve overall gait and mobility patterns   Status On-going   PT SHORT TERM GOAL #4   Title Patient will demonstrate the ability to consistently ambulate at least 738ft on level surfaces with rollator and improved gait mechanics including improved step length, improved  posture, equal step lengths, improved bilateral foot clearance, and minimal fatigue   Status On-going           PT Long Term Goals - 03/16/15 1831    PT LONG TERM GOAL #1   Title Patient will score at least 48 on Berg balance test in order to demonstrate improved overall balance skills and reduce overall fall risk   PT LONG TERM GOAL #2   Title Patient will be independent in correcting her posture and will be able to verbally explain the relationship of upright posture to improved function during functional tasks   PT LONG TERM GOAL #3   Title Patient will demonstrate the ability to ambulate at least 1528ft consistently and with good mechancis including equal step length, full TKE bilaterally, improved bilateral hip rotaion, improved bilateral foot clearance, upright posture, improved gait speed, and no fatigue   PT LONG TERM GOAL #4   Title Patient will demonstrate the ability to ambulate safely within tight spaces and over uneven surfaces with minimal risk of falling   PT LONG TERM GOAL #5   Title Patient will demonstrate the ability to safely and efficiently perform transfers from surfaces of various heights and demonstrate good safety awareness throughout in order to reduce overall fall risk at home   PT LONG TERM GOAL #6   Title Patient will demonstrate a grade of at least 4/5 in bilateral proximal hip and core musculature in order to increase stabllity and reduce overall fall risk               Plan - 03/23/15 1818    Clinical Impression Statement Strength re-tested today with overall imrpovment noted.  FOTO score also increased from 44% to 57% functiional status.  Pt is completing HEP but remains very tense, rigid due to fear of falling.  REquires max verbaal and tactile cues to complete actvities and still unable to complete quality, gross movments.  May benefit from actvities inside the BWS system to decrease fear and allow therapist to perform more manual  corrections/facilitation.     PT Next Visit Plan Continue to progress with main focus on balance and gross movements; discuss addition of BWS system.  Re-evaluate X 2 more visits.         Problem List Patient Active Problem List   Diagnosis Date Noted  . Coronary  atherosclerosis of native coronary artery 09/11/2013  . Mixed hyperlipidemia 09/11/2013  . Carotid artery occlusion without infarction 09/11/2013  . Type 2 diabetes mellitus 09/11/2013  . Difficulty in walking(719.7) 06/24/2013    Teena Irani, PTA/CLT [phone]  03/23/2015, 6:36 PM  [facility] [address] [place], [place], [address] Phone: [phone]   Fax:  [phone]          G-Codes - 03-28-2015 [phone]    Functional Assessment Tool Used FOTO 43% limited   Functional Limitation Mobility: Walking and moving around   Mobility: Walking and Moving Around Current Status [phone]) At least 40 percent but less than 60 percent impaired, limited or restricted   Mobility: Walking and Moving Around Goal Status [phone]) At least 20 percent but less than 40 percent impaired, limited or restricted     Devona Konig PT DPT [phone]

## 2015-03-24 DIAGNOSIS — Z7409 Other reduced mobility: Secondary | ICD-10-CM | POA: Diagnosis not present

## 2015-03-24 DIAGNOSIS — R293 Abnormal posture: Secondary | ICD-10-CM | POA: Diagnosis not present

## 2015-03-24 DIAGNOSIS — M6281 Muscle weakness (generalized): Secondary | ICD-10-CM | POA: Diagnosis not present

## 2015-03-24 DIAGNOSIS — R269 Unspecified abnormalities of gait and mobility: Secondary | ICD-10-CM | POA: Diagnosis not present

## 2015-03-25 ENCOUNTER — Ambulatory Visit (HOSPITAL_COMMUNITY): Payer: Medicare Other

## 2015-03-25 DIAGNOSIS — M6281 Muscle weakness (generalized): Secondary | ICD-10-CM | POA: Diagnosis not present

## 2015-03-25 DIAGNOSIS — R269 Unspecified abnormalities of gait and mobility: Secondary | ICD-10-CM

## 2015-03-25 DIAGNOSIS — R293 Abnormal posture: Secondary | ICD-10-CM | POA: Diagnosis not present

## 2015-03-25 DIAGNOSIS — Z7409 Other reduced mobility: Secondary | ICD-10-CM

## 2015-03-25 DIAGNOSIS — R296 Repeated falls: Secondary | ICD-10-CM

## 2015-03-25 DIAGNOSIS — R531 Weakness: Secondary | ICD-10-CM

## 2015-03-25 NOTE — Therapy (Signed)
[place], [place], [address] Phone: [phone]   Fax:  [phone]  Physical Therapy Treatment  Patient Details  Name: [name] MRN: [id number] Date of Birth: 03/02/1933 Referring Provider:  Delphina Cahill, MD  Encounter Date: 03/25/2015      PT End of Session - 03/25/15 1638    Visit Number 11   Number of Visits 18   Date for PT Re-Evaluation 04/13/15   Authorization Type [employer] Medicare   Authorization - Visit Number 11   Authorization - Number of Visits 20   PT Start Time 7619   PT Stop Time 1653   PT Time Calculation (min) 48 min   Equipment Utilized During Treatment Gait belt   Activity Tolerance Patient tolerated treatment well   Behavior During Therapy [facility] for tasks assessed/performed      Past Medical History  Diagnosis Date  . Type 2 diabetes mellitus   . Hyperlipidemia   . Spinal stenosis   . Depression   . Glaucoma   . Nephrolithiasis   . Hydrocephalus   . Coronary atherosclerosis of native coronary artery     Multivessel status post CABG    Past Surgical History  Procedure Laterality Date  . Back surgery    . Kyphoplasty    . Foot surgery    . Ventriculoperitoneal shunt    . Appendectomy    . Coronary artery bypass graft  11/24/1991    SVG to OM1, SVG to RCA    There were no vitals filed for this visit.  Visit Diagnosis:  Abnormality of gait  Poor posture  Generalized weakness  Falls frequently  Proximal muscle weakness  Decreased functional mobility and endurance      Subjective Assessment - 03/25/15 1611    Subjective Pt stated she has been walking around the house close to the wall wihtout, still fearful of falling.   Currently in Pain? No/denies                       [facility] Adult PT Treatment/Exercise - 03/25/15 0001    Exercises   Exercises Knee/Hip   Knee/Hip Exercises: Stretches   Hip Flexor Stretch Limitations 3 setsx 10   Piriformis  Stretch 3 reps;20 seconds   Piriformis Stretch Limitations seated during rest breaks   Gastroc Stretch 3 reps;30 seconds   Gastroc Stretch Limitations slant board   Knee/Hip Exercises: Aerobic   Stationary Bike Nustep hill 3, resistnace 3 x 51min   Knee/Hip Exercises: Standing   Forward Lunges Limitations 3x 10 reps Bil LE to increase hip mobility   Gait Training Inside // bars focus on LARGE steps with turns in 3 steps; Agility ladder with focus on increasing                   PT Short Term Goals - 03/25/15 1639    PT SHORT TERM GOAL #1   Title Patient will complain of no more than 1/10 pain in her neck in order to reduce overall discomfort and allow patient to participate in functional tasks   Status On-going   PT SHORT TERM GOAL #2   Title Patient will require cues no more than 25% of the time for good posture and proper biomechanics    Status On-going   PT SHORT TERM GOAL #3   Title Patient will demonstrate muscle strength of at least 3+/5 in proximal hip and core musculature in order to  increase stability and improve overall gait and mobility patterns   Status On-going   PT SHORT TERM GOAL #4   Title Patient will demonstrate the ability to consistently ambulate at least 779ft on level surfaces with rollator and improved gait mechanics including improved step length, improved posture, equal step lengths, improved bilateral foot clearance, and minimal fatigue   Status On-going           PT Long Term Goals - 03/25/15 1640    PT LONG TERM GOAL #1   Title Patient will score at least 48 on Berg balance test in order to demonstrate improved overall balance skills and reduce overall fall risk   Status On-going   PT LONG TERM GOAL #2   Title Patient will be independent in correcting her posture and will be able to verbally explain the relationship of upright posture to improved function during functional tasks   PT LONG TERM GOAL #3   Title Patient will demonstrate the  ability to ambulate at least 1571ft consistently and with good mechancis including equal step length, full TKE bilaterally, improved bilateral hip rotaion, improved bilateral foot clearance, upright posture, improved gait speed, and no fatigue   PT LONG TERM GOAL #4   Title Patient will demonstrate the ability to ambulate safely within tight spaces and over uneven surfaces with minimal risk of falling   PT LONG TERM GOAL #5   Title Patient will demonstrate the ability to safely and efficiently perform transfers from surfaces of various heights and demonstrate good safety awareness throughout in order to reduce overall fall risk at home   PT LONG TERM GOAL #6   Title Patient will demonstrate a grade of at least 4/5 in bilateral proximal hip and core musculature in order to increase stabllity and reduce overall fall risk               Plan - 03/25/15 1641    Clinical Impression Statement Serssion focus on improving gait mechanics utilizing lunges for increased stride length and gluteal strengthening and agility ladder for visual landmarks to encourage large stride lengths.  Pt refused to attempt gait training on treadmill with BWS system.  Pt able to verbalize importance of proper gait mechanics but does continue to require cueing to improve gait mechanics.  Pt limited by faitigue, no reports of pain through session   PT Next Visit Plan Continue to progress with main focus on balance and gross movements.  Begin static standing balance activiteis and progress to balance gait activities.  Re-evaluate next session visits.         Problem List Patient Active Problem List   Diagnosis Date Noted  . Coronary atherosclerosis of native coronary artery 09/11/2013  . Mixed hyperlipidemia 09/11/2013  . Carotid artery occlusion without infarction 09/11/2013  . Type 2 diabetes mellitus 09/11/2013  . Difficulty in walking(719.7) 06/24/2013   Ihor Austin, [place]; [place]  #[id number] [phone]  Aldona Lento 03/25/2015, 4:49 PM  [place] [address] [place], [place], [address] Phone: [phone]   Fax:  [phone]

## 2015-03-28 ENCOUNTER — Ambulatory Visit (HOSPITAL_COMMUNITY): Payer: Medicare Other | Admitting: Physical Therapy

## 2015-03-28 DIAGNOSIS — M6281 Muscle weakness (generalized): Secondary | ICD-10-CM

## 2015-03-28 DIAGNOSIS — Z7409 Other reduced mobility: Secondary | ICD-10-CM

## 2015-03-28 DIAGNOSIS — R296 Repeated falls: Secondary | ICD-10-CM

## 2015-03-28 DIAGNOSIS — R531 Weakness: Secondary | ICD-10-CM

## 2015-03-28 DIAGNOSIS — R269 Unspecified abnormalities of gait and mobility: Secondary | ICD-10-CM | POA: Diagnosis not present

## 2015-03-28 DIAGNOSIS — R293 Abnormal posture: Secondary | ICD-10-CM | POA: Diagnosis not present

## 2015-03-28 NOTE — Therapy (Signed)
[employer] [place], [place], [address] Phone: [phone]   Fax:  [phone]  Physical Therapy Treatment  Patient Details  Name: [name] MRN: [id number] Date of Birth: 1933/08/28 Referring Provider:  Delphina Cahill, MD  Encounter Date: 03/28/2015      PT End of Session - 03/28/15 1634    Visit Number 12   Number of Visits 18   Date for PT Re-Evaluation 04/13/15   Authorization Type [employer] Medicare   Authorization - Visit Number 12   Authorization - Number of Visits 20   PT Start Time 1602   PT Stop Time 1645   PT Time Calculation (min) 43 min   Equipment Utilized During Treatment Gait belt   Activity Tolerance Patient limited by fatigue   Behavior During Therapy [facility] for tasks assessed/performed;Anxious      Past Medical History  Diagnosis Date  . Type 2 diabetes mellitus   . Hyperlipidemia   . Spinal stenosis   . Depression   . Glaucoma   . Nephrolithiasis   . Hydrocephalus   . Coronary atherosclerosis of native coronary artery     Multivessel status post CABG    Past Surgical History  Procedure Laterality Date  . Back surgery    . Kyphoplasty    . Foot surgery    . Ventriculoperitoneal shunt    . Appendectomy    . Coronary artery bypass graft  11/24/1991    SVG to OM1, SVG to RCA    There were no vitals filed for this visit.  Visit Diagnosis:  Abnormality of gait  Poor posture  Generalized weakness  Falls frequently  Proximal muscle weakness  Decreased functional mobility and endurance      Subjective Assessment - 03/28/15 1634    Subjective Pt states her knee is sore where she slid trying to get into a tall truck.  Pt reports no pain.   Currently in Pain? No/denies                       [facility] Adult PT Treatment/Exercise - 03/28/15 1603    Knee/Hip Exercises: Stretches   Gastroc Stretch 3 reps;30 seconds   Gastroc Stretch Limitations slant board   Knee/Hip  Exercises: Aerobic   Stationary Bike Nustep hill 3, resistnace 3 x 68min   Knee/Hip Exercises: Standing   Forward Lunges 20 reps   Forward Lunges Limitations 4" Bil LE to increase hip mobility             Balance Exercises - 03/28/15 1609    Balance Exercises: Standing   Rockerboard Lateral;30 seconds;Other (comment)  2 sets for 1 minute   Retro Gait 1 rep   Sidestepping 1 rep   Turning Both   Cone Rotation Foam/compliant surface;Right turn;Left turn   Other Standing Exercises walking forward big steps no AD             PT Short Term Goals - 03/25/15 1639    PT SHORT TERM GOAL #1   Title Patient will complain of no more than 1/10 pain in her neck in order to reduce overall discomfort and allow patient to participate in functional tasks   Status On-going   PT SHORT TERM GOAL #2   Title Patient will require cues no more than 25% of the time for good posture and proper biomechanics    Status On-going   PT SHORT TERM GOAL #3   Title Patient will  demonstrate muscle strength of at least 3+/5 in proximal hip and core musculature in order to increase stability and improve overall gait and mobility patterns   Status On-going   PT SHORT TERM GOAL #4   Title Patient will demonstrate the ability to consistently ambulate at least 788ft on level surfaces with rollator and improved gait mechanics including improved step length, improved posture, equal step lengths, improved bilateral foot clearance, and minimal fatigue   Status On-going           PT Long Term Goals - 03/25/15 1640    PT LONG TERM GOAL #1   Title Patient will score at least 48 on Berg balance test in order to demonstrate improved overall balance skills and reduce overall fall risk   Status On-going   PT LONG TERM GOAL #2   Title Patient will be independent in correcting her posture and will be able to verbally explain the relationship of upright posture to improved function during functional tasks   PT LONG  TERM GOAL #3   Title Patient will demonstrate the ability to ambulate at least 1536ft consistently and with good mechancis including equal step length, full TKE bilaterally, improved bilateral hip rotaion, improved bilateral foot clearance, upright posture, improved gait speed, and no fatigue   PT LONG TERM GOAL #4   Title Patient will demonstrate the ability to ambulate safely within tight spaces and over uneven surfaces with minimal risk of falling   PT LONG TERM GOAL #5   Title Patient will demonstrate the ability to safely and efficiently perform transfers from surfaces of various heights and demonstrate good safety awareness throughout in order to reduce overall fall risk at home   PT LONG TERM GOAL #6   Title Patient will demonstrate a grade of at least 4/5 in bilateral proximal hip and core musculature in order to increase stabllity and reduce overall fall risk               Plan - 03/28/15 1635    Clinical Impression Statement Pt's fear and anxiety of falling continue to limit her gait ability.  Pt becomes SOB with short distances because of this.  Able to complete exercises with use of bilateral UE's.  continued need for frequent rest breaks due to fatigue.  Pt without c/o pain during session.    PT Next Visit Plan Continue to progress with main focus on balance and gross movements.  Attempt BWS system if patient agrees.        Problem List Patient Active Problem List   Diagnosis Date Noted  . Coronary atherosclerosis of native coronary artery 09/11/2013  . Mixed hyperlipidemia 09/11/2013  . Carotid artery occlusion without infarction 09/11/2013  . Type 2 diabetes mellitus 09/11/2013  . Difficulty in walking(719.7) 06/24/2013    Teena Irani, PTA/CLT [phone]  03/28/2015, 4:39 PM  [facility] [address] [place], [place], [address] Phone: [phone]   Fax:  [phone]

## 2015-03-30 ENCOUNTER — Ambulatory Visit (HOSPITAL_COMMUNITY): Payer: Medicare Other | Admitting: Physical Therapy

## 2015-04-01 ENCOUNTER — Encounter (HOSPITAL_COMMUNITY): Payer: Medicare Other | Admitting: Physical Therapy

## 2015-04-04 ENCOUNTER — Ambulatory Visit (HOSPITAL_COMMUNITY): Payer: Medicare Other

## 2015-04-04 DIAGNOSIS — R296 Repeated falls: Secondary | ICD-10-CM

## 2015-04-04 DIAGNOSIS — R269 Unspecified abnormalities of gait and mobility: Secondary | ICD-10-CM | POA: Diagnosis not present

## 2015-04-04 DIAGNOSIS — R293 Abnormal posture: Secondary | ICD-10-CM | POA: Diagnosis not present

## 2015-04-04 DIAGNOSIS — M6281 Muscle weakness (generalized): Secondary | ICD-10-CM | POA: Diagnosis not present

## 2015-04-04 DIAGNOSIS — R531 Weakness: Secondary | ICD-10-CM

## 2015-04-04 DIAGNOSIS — Z7409 Other reduced mobility: Secondary | ICD-10-CM

## 2015-04-04 NOTE — Therapy (Signed)
[place], [place], [address] Phone: [phone]   Fax:  [phone]  Physical Therapy Treatment  Patient Details  Name: [name] MRN: [id number] Date of Birth: 1933/03/31 Referring Provider:  Delphina Cahill, MD  Encounter Date: 04/04/2015      PT End of Session - 04/04/15 1628    Visit Number 13   Number of Visits 18   Date for PT Re-Evaluation 04/13/15   Authorization Type [employer]HC Medicare   Authorization - Visit Number 13   Authorization - Number of Visits 20   PT Start Time 5400   PT Stop Time 1653   PT Time Calculation (min) 48 min   Equipment Utilized During Treatment Gait belt   Activity Tolerance Patient limited by fatigue;Patient limited by pain   Behavior During Therapy [facility] for tasks assessed/performed      Past Medical History  Diagnosis Date  . Type 2 diabetes mellitus   . Hyperlipidemia   . Spinal stenosis   . Depression   . Glaucoma   . Nephrolithiasis   . Hydrocephalus   . Coronary atherosclerosis of native coronary artery     Multivessel status post CABG    Past Surgical History  Procedure Laterality Date  . Back surgery    . Kyphoplasty    . Foot surgery    . Ventriculoperitoneal shunt    . Appendectomy    . Coronary artery bypass graft  11/24/1991    SVG to OM1, SVG to RCA    There were no vitals filed for this visit.  Visit Diagnosis:  Abnormality of gait  Poor posture  Generalized weakness  Falls frequently  Proximal muscle weakness  Decreased functional mobility and endurance      Subjective Assessment - 04/04/15 1616    Subjective Pt reported a fall in living room on Friday and her whole body is sore, required son in law to help her off the floor   Currently in Pain? Yes   Pain Score 8    Pain Location Generalized   Pain Orientation Right;Left   Pain Descriptors / Indicators Sore;Tightness              [facility] Adult PT Treatment/Exercise -  04/04/15 0001    Exercises   Exercises Knee/Hip   Knee/Hip Exercises: Stretches   Active Hamstring Stretch 3 reps;30 seconds   Active Hamstring Stretch Limitations 1 direction 14 in step   Hip Flexor Stretch 3 reps   Hip Flexor Stretch Limitations 3x 10 on 8in step   Piriformis Stretch 3 reps;30 seconds   Gastroc Stretch 3 reps;30 seconds   Gastroc Stretch Limitations slant board   Knee/Hip Exercises: Aerobic   Stationary Bike Nustep hill 3, resistnace 3 x 54min   Knee/Hip Exercises: Standing   Forward Lunges 20 reps   Forward Lunges Limitations 4" Bil LE to increase hip mobility   Other Standing Knee Exercises weight shifting and rotation10x             PT Short Term Goals - 04/04/15 1645    PT SHORT TERM GOAL #1   Title Patient will complain of no more than 1/10 pain in her neck in order to reduce overall discomfort and allow patient to participate in functional tasks   PT SHORT TERM GOAL #2   Title Patient will require cues no more than 25% of the time for good posture and proper biomechanics    Status On-going   PT  SHORT TERM GOAL #3   Title Patient will demonstrate muscle strength of at least 3+/5 in proximal hip and core musculature in order to increase stability and improve overall gait and mobility patterns   Status On-going   PT SHORT TERM GOAL #4   Title Patient will demonstrate the ability to consistently ambulate at least 753ft on level surfaces with rollator and improved gait mechanics including improved step length, improved posture, equal step lengths, improved bilateral foot clearance, and minimal fatigue   Status On-going           PT Long Term Goals - 04/04/15 1645    PT LONG TERM GOAL #1   Title Patient will score at least 48 on Berg balance test in order to demonstrate improved overall balance skills and reduce overall fall risk   PT LONG TERM GOAL #2   Title Patient will be independent in correcting her posture and will be able to verbally explain  the relationship of upright posture to improved function during functional tasks   PT LONG TERM GOAL #3   Title Patient will demonstrate the ability to ambulate at least 1566ft consistently and with good mechancis including equal step length, full TKE bilaterally, improved bilateral hip rotaion, improved bilateral foot clearance, upright posture, improved gait speed, and no fatigue   PT LONG TERM GOAL #4   Title Patient will demonstrate the ability to ambulate safely within tight spaces and over uneven surfaces with minimal risk of falling   PT LONG TERM GOAL #5   Title Patient will demonstrate the ability to safely and efficiently perform transfers from surfaces of various heights and demonstrate good safety awareness throughout in order to reduce overall fall risk at home   PT LONG TERM GOAL #6   Title Patient will demonstrate a grade of at least 4/5 in bilateral proximal hip and core musculature in order to increase stabllity and reduce overall fall risk               Plan - 04/04/15 1629    Clinical Impression Statement Pt very fearful and anxious following report of fall continues to limit her gait ability.  Pt becomes SOB with short distances becuase of shuffled gait mechanics.  Max cueing to slow down and increase stride length with gait.  Pt unwilling to attempt BWS system with gait training.  Session focus on stretches to improve LE musculature lengthening following reports of increased soreness and tightness.  Noted improved gait mechanics following hamstring and piriformis stretches though does continue to require cueing  to increase stride length.  Pt stated soreness decreased at end of session.     PT Next Visit Plan Continue to progress with main focus on balance and gross movements.          Problem List Patient Active Problem List   Diagnosis Date Noted  . Coronary atherosclerosis of native coronary artery 09/11/2013  . Mixed hyperlipidemia 09/11/2013  . Carotid artery  occlusion without infarction 09/11/2013  . Type 2 diabetes mellitus 09/11/2013  . Difficulty in walking(719.7) 06/24/2013   Ihor Austin, [place]; [place] #[id number] [phone]  Aldona Lento 04/04/2015, 4:56 PM  [facility] [address] [place], [place], [address] Phone: [phone]   Fax:  [phone]

## 2015-04-06 ENCOUNTER — Ambulatory Visit (HOSPITAL_COMMUNITY): Payer: Medicare Other | Admitting: Physical Therapy

## 2015-04-06 DIAGNOSIS — M6281 Muscle weakness (generalized): Secondary | ICD-10-CM | POA: Diagnosis not present

## 2015-04-06 DIAGNOSIS — R531 Weakness: Secondary | ICD-10-CM

## 2015-04-06 DIAGNOSIS — R269 Unspecified abnormalities of gait and mobility: Secondary | ICD-10-CM

## 2015-04-06 DIAGNOSIS — R293 Abnormal posture: Secondary | ICD-10-CM | POA: Diagnosis not present

## 2015-04-06 DIAGNOSIS — Z7409 Other reduced mobility: Secondary | ICD-10-CM

## 2015-04-06 DIAGNOSIS — R296 Repeated falls: Secondary | ICD-10-CM

## 2015-04-06 NOTE — Therapy (Signed)
[place], [place], [address] Phone: [phone]   Fax:  [phone]  Physical Therapy Treatment  Patient Details  Name: [name] MRN: [id number] Date of Birth: 20-Jul-1933 Referring Provider:  Delphina Cahill, MD  Encounter Date: 04/06/2015      PT End of Session - 04/06/15 1723    Visit Number 14   Number of Visits 18   Date for PT Re-Evaluation 04/13/15   Authorization Type [employer] Medicare   Authorization - Visit Number 14   Authorization - Number of Visits 20   PT Start Time 1600   PT Stop Time 1645   PT Time Calculation (min) 45 min   Equipment Utilized During Treatment Gait belt   Activity Tolerance Patient limited by fatigue;Patient tolerated treatment well   Behavior During Therapy [facility] for tasks assessed/performed      Past Medical History  Diagnosis Date  . Type 2 diabetes mellitus   . Hyperlipidemia   . Spinal stenosis   . Depression   . Glaucoma   . Nephrolithiasis   . Hydrocephalus   . Coronary atherosclerosis of native coronary artery     Multivessel status post CABG    Past Surgical History  Procedure Laterality Date  . Back surgery    . Kyphoplasty    . Foot surgery    . Ventriculoperitoneal shunt    . Appendectomy    . Coronary artery bypass graft  11/24/1991    SVG to OM1, SVG to RCA    There were no vitals filed for this visit.  Visit Diagnosis:  Abnormality of gait  Poor posture  Generalized weakness  Falls frequently  Proximal muscle weakness  Decreased functional mobility and endurance      Subjective Assessment - 04/06/15 1747    Subjective Pt states she is not as sore as she was last visit following a fall at home last Friday.  PT reports she has been trying to make larger steps    Currently in Pain? No/denies                         [facility] Adult PT Treatment/Exercise - 04/06/15 1716    Knee/Hip Exercises: Stretches   Active Hamstring  Stretch 3 reps;30 seconds   Active Hamstring Stretch Limitations 1 direction 14 in step   Hip Flexor Stretch 3 reps   Hip Flexor Stretch Limitations 3x 10 on 8in step   Gastroc Stretch 3 reps;30 seconds   Gastroc Stretch Limitations slant board   Knee/Hip Exercises: Standing   Forward Lunges 20 reps   Forward Lunges Limitations 4" Bil LE to increase hip mobility             Balance Exercises - 04/06/15 1621    Balance Exercises: Standing   Retro Gait 1 rep   Sidestepping 1 rep   Turning Both   Cone Rotation Foam/compliant surface;Right turn;Left turn             PT Short Term Goals - 04/04/15 1645    PT SHORT TERM GOAL #1   Title Patient will complain of no more than 1/10 pain in her neck in order to reduce overall discomfort and allow patient to participate in functional tasks   PT SHORT TERM GOAL #2   Title Patient will require cues no more than 25% of the time for good posture and proper biomechanics    Status On-going   PT  SHORT TERM GOAL #3   Title Patient will demonstrate muscle strength of at least 3+/5 in proximal hip and core musculature in order to increase stability and improve overall gait and mobility patterns   Status On-going   PT SHORT TERM GOAL #4   Title Patient will demonstrate the ability to consistently ambulate at least 780ft on level surfaces with rollator and improved gait mechanics including improved step length, improved posture, equal step lengths, improved bilateral foot clearance, and minimal fatigue   Status On-going           PT Long Term Goals - 04/04/15 1645    PT LONG TERM GOAL #1   Title Patient will score at least 48 on Berg balance test in order to demonstrate improved overall balance skills and reduce overall fall risk   PT LONG TERM GOAL #2   Title Patient will be independent in correcting her posture and will be able to verbally explain the relationship of upright posture to improved function during functional tasks   PT  LONG TERM GOAL #3   Title Patient will demonstrate the ability to ambulate at least 1517ft consistently and with good mechancis including equal step length, full TKE bilaterally, improved bilateral hip rotaion, improved bilateral foot clearance, upright posture, improved gait speed, and no fatigue   PT LONG TERM GOAL #4   Title Patient will demonstrate the ability to ambulate safely within tight spaces and over uneven surfaces with minimal risk of falling   PT LONG TERM GOAL #5   Title Patient will demonstrate the ability to safely and efficiently perform transfers from surfaces of various heights and demonstrate good safety awareness throughout in order to reduce overall fall risk at home   PT LONG TERM GOAL #6   Title Patient will demonstrate a grade of at least 4/5 in bilateral proximal hip and core musculature in order to increase stabllity and reduce overall fall risk               Plan - 04/06/15 1749    Clinical Impression Statement Pt appeared to have decreased fear/anxiety during treatment today as compared to past visits.  PT with imrpoved step distance when turning showing improved form and stability.  Pt also completed functional actvities with less LOB and insecurity.  Increased stride at EOS.     PT Next Visit Plan Continue to progress with main focus on balance and gross movements.          Problem List Patient Active Problem List   Diagnosis Date Noted  . Coronary atherosclerosis of native coronary artery 09/11/2013  . Mixed hyperlipidemia 09/11/2013  . Carotid artery occlusion without infarction 09/11/2013  . Type 2 diabetes mellitus 09/11/2013  . Difficulty in walking(719.7) 06/24/2013    Teena Irani, PTA/CLT [phone] 04/06/2015, 5:53 PM  [facility] [address] [place], [place], [address] Phone: [phone]   Fax:  [phone]

## 2015-04-07 DIAGNOSIS — E119 Type 2 diabetes mellitus without complications: Secondary | ICD-10-CM | POA: Diagnosis not present

## 2015-04-08 ENCOUNTER — Ambulatory Visit (HOSPITAL_COMMUNITY): Payer: Medicare Other | Admitting: Physical Therapy

## 2015-04-08 DIAGNOSIS — M6281 Muscle weakness (generalized): Secondary | ICD-10-CM | POA: Diagnosis not present

## 2015-04-08 DIAGNOSIS — Z7409 Other reduced mobility: Secondary | ICD-10-CM | POA: Diagnosis not present

## 2015-04-08 DIAGNOSIS — R293 Abnormal posture: Secondary | ICD-10-CM

## 2015-04-08 DIAGNOSIS — R531 Weakness: Secondary | ICD-10-CM

## 2015-04-08 DIAGNOSIS — R296 Repeated falls: Secondary | ICD-10-CM

## 2015-04-08 DIAGNOSIS — R269 Unspecified abnormalities of gait and mobility: Secondary | ICD-10-CM

## 2015-04-08 NOTE — Therapy (Signed)
[place], [place], [address] Phone: [phone]   Fax:  [phone]  Physical Therapy Treatment  Patient Details  Name: [name] MRN: [id number] Date of Birth: Dec 21, 1932 Referring Provider:  Delphina Cahill, MD  Encounter Date: 04/08/2015      PT End of Session - 04/08/15 1717    Visit Number 15   Number of Visits 18   Date for PT Re-Evaluation 04/13/15   Authorization Type [employer] Medicare   Authorization - Visit Number 15   Authorization - Number of Visits 20   PT Start Time 5277   PT Stop Time 1710   PT Time Calculation (min) 60 min   Equipment Utilized During Treatment Gait belt   Activity Tolerance Patient limited by fatigue;Patient tolerated treatment well   Behavior During Therapy [facility] for tasks assessed/performed      Past Medical History  Diagnosis Date  . Type 2 diabetes mellitus   . Hyperlipidemia   . Spinal stenosis   . Depression   . Glaucoma   . Nephrolithiasis   . Hydrocephalus   . Coronary atherosclerosis of native coronary artery     Multivessel status post CABG    Past Surgical History  Procedure Laterality Date  . Back surgery    . Kyphoplasty    . Foot surgery    . Ventriculoperitoneal shunt    . Appendectomy    . Coronary artery bypass graft  11/24/1991    SVG to OM1, SVG to RCA    There were no vitals filed for this visit.  Visit Diagnosis:  Abnormality of gait  Poor posture  Generalized weakness  Falls frequently  Proximal muscle weakness  Decreased functional mobility and endurance      Subjective Assessment - 04/08/15 1717    Subjective pt states her legs are still feeling weak and sore.  No pain reported.                         [place] Adult PT Treatment/Exercise - 04/08/15 1618    Knee/Hip Exercises: Stretches   Active Hamstring Stretch 3 reps;30 seconds   Active Hamstring Stretch Limitations 1 direction 14 in step   Hip Flexor  Stretch 3 reps   Hip Flexor Stretch Limitations 3x 10 on 8in step   Gastroc Stretch 3 reps;30 seconds   Gastroc Stretch Limitations slant board   Knee/Hip Exercises: Aerobic   Stationary Bike Nustep hill 3, resistnace 3 x 57min   Knee/Hip Exercises: Standing   Forward Lunges 20 reps   Forward Lunges Limitations 4" Bil LE to increase hip mobility             Balance Exercises - 04/08/15 1622    Balance Exercises: Standing   Rockerboard Lateral;Other time (comment)  2 minutes   Retro Gait 2 reps   Sidestepping 2 reps   Turning Both   Cone Rotation Foam/compliant surface;Right turn;Left turn             PT Short Term Goals - 04/04/15 1645    PT SHORT TERM GOAL #1   Title Patient will complain of no more than 1/10 pain in her neck in order to reduce overall discomfort and allow patient to participate in functional tasks   PT SHORT TERM GOAL #2   Title Patient will require cues no more than 25% of the time for good posture and proper biomechanics    Status On-going  PT SHORT TERM GOAL #3   Title Patient will demonstrate muscle strength of at least 3+/5 in proximal hip and core musculature in order to increase stability and improve overall gait and mobility patterns   Status On-going   PT SHORT TERM GOAL #4   Title Patient will demonstrate the ability to consistently ambulate at least 749ft on level surfaces with rollator and improved gait mechanics including improved step length, improved posture, equal step lengths, improved bilateral foot clearance, and minimal fatigue   Status On-going           PT Long Term Goals - 04/04/15 1645    PT LONG TERM GOAL #1   Title Patient will score at least 48 on Berg balance test in order to demonstrate improved overall balance skills and reduce overall fall risk   PT LONG TERM GOAL #2   Title Patient will be independent in correcting her posture and will be able to verbally explain the relationship of upright posture to improved  function during functional tasks   PT LONG TERM GOAL #3   Title Patient will demonstrate the ability to ambulate at least 1521ft consistently and with good mechancis including equal step length, full TKE bilaterally, improved bilateral hip rotaion, improved bilateral foot clearance, upright posture, improved gait speed, and no fatigue   PT LONG TERM GOAL #4   Title Patient will demonstrate the ability to ambulate safely within tight spaces and over uneven surfaces with minimal risk of falling   PT LONG TERM GOAL #5   Title Patient will demonstrate the ability to safely and efficiently perform transfers from surfaces of various heights and demonstrate good safety awareness throughout in order to reduce overall fall risk at home   PT LONG TERM GOAL #6   Title Patient will demonstrate a grade of at least 4/5 in bilateral proximal hip and core musculature in order to increase stabllity and reduce overall fall risk               Plan - 04/08/15 1659    Clinical Impression Statement Continued focus on increasing mobility, balance and LE strength.  Pt with improved trunk moblitiy with cone reach today and less seated breaks taken during session.  Pt with decreased step length today with actvities as compared to last visit.  PT required more verbal and tactile cues today as well.     PT Next Visit Plan Continue to progress with main focus on balance and gross movements.  re-evaluate next week.        Problem List Patient Active Problem List   Diagnosis Date Noted  . Coronary atherosclerosis of native coronary artery 09/11/2013  . Mixed hyperlipidemia 09/11/2013  . Carotid artery occlusion without infarction 09/11/2013  . Type 2 diabetes mellitus 09/11/2013  . Difficulty in walking(719.7) 06/24/2013    Teena Irani, PTA/CLT [phone] 04/08/2015, 5:19 PM  [facility] [address] [place], [place], [address] Phone: [phone]   Fax:   [phone]

## 2015-04-11 ENCOUNTER — Ambulatory Visit (HOSPITAL_COMMUNITY): Payer: Medicare Other | Admitting: Physical Therapy

## 2015-04-11 DIAGNOSIS — R293 Abnormal posture: Secondary | ICD-10-CM

## 2015-04-11 DIAGNOSIS — M6281 Muscle weakness (generalized): Secondary | ICD-10-CM | POA: Diagnosis not present

## 2015-04-11 DIAGNOSIS — R531 Weakness: Secondary | ICD-10-CM

## 2015-04-11 DIAGNOSIS — Z7409 Other reduced mobility: Secondary | ICD-10-CM | POA: Diagnosis not present

## 2015-04-11 DIAGNOSIS — R296 Repeated falls: Secondary | ICD-10-CM

## 2015-04-11 DIAGNOSIS — R269 Unspecified abnormalities of gait and mobility: Secondary | ICD-10-CM | POA: Diagnosis not present

## 2015-04-11 NOTE — Therapy (Signed)
[place], [place], [address] Phone: [phone]   Fax:  [phone]  Physical Therapy Treatment  Patient Details  Name: [name] MRN: [id number] Date of Birth: 1933/06/15 Referring Provider:  Delphina Cahill, MD  Encounter Date: 04/11/2015      PT End of Session - 04/11/15 1640    Visit Number 16   Number of Visits 18   Date for PT Re-Evaluation 04/13/15   Authorization Type [employer] Medicare   Authorization - Visit Number 16   Authorization - Number of Visits 20   PT Start Time 1602   PT Stop Time 1648   PT Time Calculation (min) 46 min   Equipment Utilized During Treatment Gait belt   Activity Tolerance Patient limited by fatigue;Patient tolerated treatment well   Behavior During Therapy [facility] for tasks assessed/performed      Past Medical History  Diagnosis Date  . Type 2 diabetes mellitus   . Hyperlipidemia   . Spinal stenosis   . Depression   . Glaucoma   . Nephrolithiasis   . Hydrocephalus   . Coronary atherosclerosis of native coronary artery     Multivessel status post CABG    Past Surgical History  Procedure Laterality Date  . Back surgery    . Kyphoplasty    . Foot surgery    . Ventriculoperitoneal shunt    . Appendectomy    . Coronary artery bypass graft  11/24/1991    SVG to OM1, SVG to RCA    There were no vitals filed for this visit.  Visit Diagnosis:  Abnormality of gait  Poor posture  Generalized weakness  Falls frequently  Proximal muscle weakness  Decreased functional mobility and endurance      Subjective Assessment - 04/11/15 1608    Subjective PT states she did nothing over the weekend.  States she is doing more around her house than used to.     Currently in Pain? No/denies                              Balance Exercises - 04/11/15 1626    Balance Exercises: Standing   Rockerboard Anterior/posterior;Lateral;Other time (comment)  2  minutes each   Balance Beam 2RT after/before hurdles w/obstacle course   Sidestepping Theraband;1 rep  red   Step Over Hurdles / Cones 2RT 6"/12" step to   Balance Exercises: Seated   Other Seated Exercises Comments nustep 10' level 3 hills #3             PT Short Term Goals - 04/04/15 1645    PT SHORT TERM GOAL #1   Title Patient will complain of no more than 1/10 pain in her neck in order to reduce overall discomfort and allow patient to participate in functional tasks   PT SHORT TERM GOAL #2   Title Patient will require cues no more than 25% of the time for good posture and proper biomechanics    Status On-going   PT SHORT TERM GOAL #3   Title Patient will demonstrate muscle strength of at least 3+/5 in proximal hip and core musculature in order to increase stability and improve overall gait and mobility patterns   Status On-going   PT SHORT TERM GOAL #4   Title Patient will demonstrate the ability to consistently ambulate at least 739ft on level surfaces with rollator and improved gait mechanics including improved step length,  improved posture, equal step lengths, improved bilateral foot clearance, and minimal fatigue   Status On-going           PT Long Term Goals - 04/04/15 1645    PT LONG TERM GOAL #1   Title Patient will score at least 48 on Berg balance test in order to demonstrate improved overall balance skills and reduce overall fall risk   PT LONG TERM GOAL #2   Title Patient will be independent in correcting her posture and will be able to verbally explain the relationship of upright posture to improved function during functional tasks   PT LONG TERM GOAL #3   Title Patient will demonstrate the ability to ambulate at least 15100ft consistently and with good mechancis including equal step length, full TKE bilaterally, improved bilateral hip rotaion, improved bilateral foot clearance, upright posture, improved gait speed, and no fatigue   PT LONG TERM GOAL #4    Title Patient will demonstrate the ability to ambulate safely within tight spaces and over uneven surfaces with minimal risk of falling   PT LONG TERM GOAL #5   Title Patient will demonstrate the ability to safely and efficiently perform transfers from surfaces of various heights and demonstrate good safety awareness throughout in order to reduce overall fall risk at home   PT LONG TERM GOAL #6   Title Patient will demonstrate a grade of at least 4/5 in bilateral proximal hip and core musculature in order to increase stabllity and reduce overall fall risk               Plan - 04/11/15 1640    Clinical Impression Statement Progressed balance actvities to include obstacle course with 6"/12" hurdles and balance beam.  pt required 1 HHA to complete and with noted difficulty.  Unable to alternate step overs and needed cues to increase step height to clear, especially 12" hurdles.  Pt with difficulty placing Lt LE ahead of Rt with balance beam causing instability.  Pt conntinues to have high fatigue level and requires frequent rest breaks but is taking larger steps with gait.     PT Next Visit Plan Continue to progress with main focus on balance and gross movements.  re-evaluate X 2 more visits.         Problem List Patient Active Problem List   Diagnosis Date Noted  . Coronary atherosclerosis of native coronary artery 09/11/2013  . Mixed hyperlipidemia 09/11/2013  . Carotid artery occlusion without infarction 09/11/2013  . Type 2 diabetes mellitus 09/11/2013  . Difficulty in walking(719.7) 06/24/2013    Teena Irani, PTA/CLT [phone] 04/11/2015, 4:45 PM  [facility] [address] [place], [place], [address] Phone: [phone]   Fax:  [phone]

## 2015-04-13 ENCOUNTER — Ambulatory Visit (HOSPITAL_COMMUNITY): Payer: Medicare Other | Admitting: Physical Therapy

## 2015-04-13 DIAGNOSIS — M6281 Muscle weakness (generalized): Secondary | ICD-10-CM

## 2015-04-13 DIAGNOSIS — R293 Abnormal posture: Secondary | ICD-10-CM | POA: Diagnosis not present

## 2015-04-13 DIAGNOSIS — R296 Repeated falls: Secondary | ICD-10-CM

## 2015-04-13 DIAGNOSIS — Z7409 Other reduced mobility: Secondary | ICD-10-CM | POA: Diagnosis not present

## 2015-04-13 DIAGNOSIS — R269 Unspecified abnormalities of gait and mobility: Secondary | ICD-10-CM | POA: Diagnosis not present

## 2015-04-13 DIAGNOSIS — R531 Weakness: Secondary | ICD-10-CM

## 2015-04-13 NOTE — Therapy (Signed)
[place], [place], [address] Phone: [phone]   Fax:  [phone]  Physical Therapy Treatment  Patient Details  Name: [name] MRN: [id number] Date of Birth: 1933/06/03 Referring Provider:  Delphina Cahill, MD  Encounter Date: 04/13/2015      PT End of Session - 04/13/15 1624    Visit Number 17   Number of Visits 18   Date for PT Re-Evaluation 04/13/15   Authorization Type [employer] Medicare   Authorization - Visit Number 17   Authorization - Number of Visits 20   PT Start Time 1603   PT Stop Time 1645   PT Time Calculation (min) 42 min   Equipment Utilized During Treatment Gait belt   Activity Tolerance Patient limited by fatigue;Patient tolerated treatment well   Behavior During Therapy [facility] for tasks assessed/performed      Past Medical History  Diagnosis Date  . Type 2 diabetes mellitus   . Hyperlipidemia   . Spinal stenosis   . Depression   . Glaucoma   . Nephrolithiasis   . Hydrocephalus   . Coronary atherosclerosis of native coronary artery     Multivessel status post CABG    Past Surgical History  Procedure Laterality Date  . Back surgery    . Kyphoplasty    . Foot surgery    . Ventriculoperitoneal shunt    . Appendectomy    . Coronary artery bypass graft  11/24/1991    SVG to OM1, SVG to RCA    There were no vitals filed for this visit.  Visit Diagnosis:  Abnormality of gait  Poor posture  Generalized weakness  Falls frequently  Proximal muscle weakness  Decreased functional mobility and endurance      Subjective Assessment - 04/13/15 1615    Subjective PT sttes she fell this morning over a chair in her living room.  STates she doesn't know how it happened.  Reports pain/soreness in her Lt ribcage and down into Anterior Lt LE.   Currently in Pain? Yes   Pain Score 8    Pain Orientation Left;Other (Comment)                         [facility] Adult PT  Treatment/Exercise - 04/13/15 1618    Knee/Hip Exercises: Aerobic   Stationary Bike Nustep hill 3, resistnace 3 x 44min   Knee/Hip Exercises: Standing   Gait Training 226 feet X 2 with standing rest approx every 50 feet.                  PT Short Term Goals - 04/04/15 1645    PT SHORT TERM GOAL #1   Title Patient will complain of no more than 1/10 pain in her neck in order to reduce overall discomfort and allow patient to participate in functional tasks   PT SHORT TERM GOAL #2   Title Patient will require cues no more than 25% of the time for good posture and proper biomechanics    Status On-going   PT SHORT TERM GOAL #3   Title Patient will demonstrate muscle strength of at least 3+/5 in proximal hip and core musculature in order to increase stability and improve overall gait and mobility patterns   Status On-going   PT SHORT TERM GOAL #4   Title Patient will demonstrate the ability to consistently ambulate at least 764ft on level surfaces with rollator and improved gait mechanics including  improved step length, improved posture, equal step lengths, improved bilateral foot clearance, and minimal fatigue   Status On-going           PT Long Term Goals - 04/04/15 1645    PT LONG TERM GOAL #1   Title Patient will score at least 48 on Berg balance test in order to demonstrate improved overall balance skills and reduce overall fall risk   PT LONG TERM GOAL #2   Title Patient will be independent in correcting her posture and will be able to verbally explain the relationship of upright posture to improved function during functional tasks   PT LONG TERM GOAL #3   Title Patient will demonstrate the ability to ambulate at least 1570ft consistently and with good mechancis including equal step length, full TKE bilaterally, improved bilateral hip rotaion, improved bilateral foot clearance, upright posture, improved gait speed, and no fatigue   PT LONG TERM GOAL #4   Title Patient will  demonstrate the ability to ambulate safely within tight spaces and over uneven surfaces with minimal risk of falling   PT LONG TERM GOAL #5   Title Patient will demonstrate the ability to safely and efficiently perform transfers from surfaces of various heights and demonstrate good safety awareness throughout in order to reduce overall fall risk at home   PT LONG TERM GOAL #6   Title Patient will demonstrate a grade of at least 4/5 in bilateral proximal hip and core musculature in order to increase stabllity and reduce overall fall risk               Plan - 04/13/15 1636    Clinical Impression Statement Patient with difficulty completing exercises today due to fall this morning. States she was going to call and cancel but decided she would try to do some actvity.  Unable to complete balance actvities.  Attempted balance beam and patient very unstable and increasingly nervous during task.  Max distance ambulated today of 226 feet, however had to stop every 50 feet to take a standing rest break.  Pt with continued diaphoresis during session.  Advised patient to call MD if pain increased in Lt rib area or ED if breathing becomes impaired.     PT Next Visit Plan Continue to progress with main focus on balance and gross movements.  re-evaluate next visits.         Problem List Patient Active Problem List   Diagnosis Date Noted  . Coronary atherosclerosis of native coronary artery 09/11/2013  . Mixed hyperlipidemia 09/11/2013  . Carotid artery occlusion without infarction 09/11/2013  . Type 2 diabetes mellitus 09/11/2013  . Difficulty in walking(719.7) 06/24/2013    Teena Irani, PTA/CLT [phone]  04/13/2015, 4:40 PM  [facility] [address] [place], [place], [address] Phone: [phone]   Fax:  [phone]

## 2015-04-15 ENCOUNTER — Ambulatory Visit (HOSPITAL_COMMUNITY): Payer: Medicare Other | Admitting: Physical Therapy

## 2015-04-15 ENCOUNTER — Telehealth (HOSPITAL_COMMUNITY): Payer: Self-pay | Admitting: Physical Therapy

## 2015-04-15 NOTE — Telephone Encounter (Signed)
Patient did not arrive for 3:15pm appointment. Called patient to check in- patient stated that she had left a message cancelling her appointment as she is still not feeling good from the fall, states that her L ribs really hurt and that her L leg is continuing to hurt as well and that she just didn't feel like she could come in today. Encouraged patient to go to MD or ED to identify injury, especially if breathing difficulty should become worse. Reminded patient of time/date of next appointment, after which patient stated that she is having trouble financially covering PT sessions. Encouraged patient to come to last session on Monday and also told patient that if she still could not make appointment, PT would give her another call to follow up and determine course of care moving forward.   Recommend encouraging patient to continue with skilled PT even if it is 1x/week or every other week since patient continues to be high fall risk.   Deniece Ree PT, DPT [phone]

## 2015-04-18 ENCOUNTER — Telehealth (HOSPITAL_COMMUNITY): Payer: Self-pay | Admitting: Physical Therapy

## 2015-04-18 ENCOUNTER — Ambulatory Visit (HOSPITAL_COMMUNITY): Payer: Medicare Other | Admitting: Physical Therapy

## 2015-04-18 NOTE — Telephone Encounter (Signed)
Returned patient's phone call from this morning. Patient states that she is still having severe pain in her ribs and has a hard time breathing and really even moving right now; strongly encouraged patient to go to MD to fully identify extent of the injury. Patient continued to state that the MD could probably not do much to broken ribs; continued to educate patient on importance of identifying the extent of injury in order to rule out diagnoses such as punctured lung due to ongoing difficulty with respiration and mobility.   Patient also continues to state that she is not sure how she can continue with therapy due to financial constraints. Suggested that patient continue with therapy due to being a high fall risk but at 1x/week or once every other week; patient said she would think about it but would like to let her ribs heal first before making a decision.   Patient educated that she will be placed on hold with skilled PT services and that clinic will call again in a few weeks to see how she is doing/discuss moving forward with skilled PT services. Patient agreeable to this course of action moving forward.   Deniece Ree PT, DPT [phone]

## 2015-04-28 ENCOUNTER — Encounter (HOSPITAL_COMMUNITY): Payer: Self-pay | Admitting: *Deleted

## 2015-04-28 ENCOUNTER — Emergency Department (HOSPITAL_COMMUNITY)
Admission: EM | Admit: 2015-04-28 | Discharge: 2015-04-28 | Disposition: A | Discharge status: Home / Self Care | Payer: Medicare Other | Attending: Emergency Medicine | Admitting: Emergency Medicine

## 2015-04-28 ENCOUNTER — Emergency Department (HOSPITAL_COMMUNITY): Payer: Medicare Other

## 2015-04-28 DIAGNOSIS — Z79899 Other long term (current) drug therapy: Secondary | ICD-10-CM | POA: Insufficient documentation

## 2015-04-28 DIAGNOSIS — S60222A Contusion of left hand, initial encounter: Secondary | ICD-10-CM | POA: Insufficient documentation

## 2015-04-28 DIAGNOSIS — Y9289 Other specified places as the place of occurrence of the external cause: Secondary | ICD-10-CM | POA: Diagnosis not present

## 2015-04-28 DIAGNOSIS — I2510 Atherosclerotic heart disease of native coronary artery without angina pectoris: Secondary | ICD-10-CM | POA: Insufficient documentation

## 2015-04-28 DIAGNOSIS — H409 Unspecified glaucoma: Secondary | ICD-10-CM | POA: Insufficient documentation

## 2015-04-28 DIAGNOSIS — E119 Type 2 diabetes mellitus without complications: Secondary | ICD-10-CM | POA: Insufficient documentation

## 2015-04-28 DIAGNOSIS — E785 Hyperlipidemia, unspecified: Secondary | ICD-10-CM | POA: Insufficient documentation

## 2015-04-28 DIAGNOSIS — F329 Major depressive disorder, single episode, unspecified: Secondary | ICD-10-CM | POA: Diagnosis not present

## 2015-04-28 DIAGNOSIS — Z8669 Personal history of other diseases of the nervous system and sense organs: Secondary | ICD-10-CM | POA: Diagnosis not present

## 2015-04-28 DIAGNOSIS — W19XXXA Unspecified fall, initial encounter: Secondary | ICD-10-CM

## 2015-04-28 DIAGNOSIS — Y9389 Activity, other specified: Secondary | ICD-10-CM | POA: Insufficient documentation

## 2015-04-28 DIAGNOSIS — Z7982 Long term (current) use of aspirin: Secondary | ICD-10-CM | POA: Diagnosis not present

## 2015-04-28 DIAGNOSIS — Y998 Other external cause status: Secondary | ICD-10-CM | POA: Diagnosis not present

## 2015-04-28 DIAGNOSIS — S99922A Unspecified injury of left foot, initial encounter: Secondary | ICD-10-CM | POA: Diagnosis not present

## 2015-04-28 DIAGNOSIS — Z880 Allergy status to penicillin: Secondary | ICD-10-CM | POA: Insufficient documentation

## 2015-04-28 DIAGNOSIS — Z951 Presence of aortocoronary bypass graft: Secondary | ICD-10-CM | POA: Diagnosis not present

## 2015-04-28 DIAGNOSIS — S2242XA Multiple fractures of ribs, left side, initial encounter for closed fracture: Secondary | ICD-10-CM | POA: Insufficient documentation

## 2015-04-28 DIAGNOSIS — S2232XA Fracture of one rib, left side, initial encounter for closed fracture: Secondary | ICD-10-CM

## 2015-04-28 DIAGNOSIS — M79672 Pain in left foot: Secondary | ICD-10-CM

## 2015-04-28 DIAGNOSIS — Z87442 Personal history of urinary calculi: Secondary | ICD-10-CM | POA: Insufficient documentation

## 2015-04-28 DIAGNOSIS — Z87891 Personal history of nicotine dependence: Secondary | ICD-10-CM | POA: Diagnosis not present

## 2015-04-28 DIAGNOSIS — W1839XA Other fall on same level, initial encounter: Secondary | ICD-10-CM | POA: Insufficient documentation

## 2015-04-28 MED ORDER — TRAMADOL HCL 50 MG PO TABS: 50.0000 mg | ORAL_TABLET | Freq: Four times a day (QID) | ORAL | Status: DC | PRN

## 2015-04-28 MED ORDER — TRAMADOL HCL 50 MG PO TABS
50.0000 mg | ORAL_TABLET | Freq: Once | ORAL | Status: AC
  Administered 2015-04-28: 50 mg via ORAL
  Filled 2015-04-28: qty 1

## 2015-04-28 NOTE — Discharge Instructions (Signed)
As discussed, your evaluation has been somewhat reassuring, but given your persistent pain following a fall, your fractured ribs, your strained foot, it is very important you monitor your condition carefully.  Please use the incentive spirometer every 4 hours for the next few days.  Please take all medication as directed, ice your rib cage, your hand, your foot, 3 times daily.  Please follow-up with our orthopedic physician in one week for repeat evaluation.  Return here for concerning changes in your condition.

## 2015-04-28 NOTE — ED Provider Notes (Signed)
CSN: [id number]     Arrival date & time 04/28/15  1041 History   First MD Initiated Contact with Patient 04/28/15 1302     Chief Complaint  Patient presents with  . Fall    HPI  Patient presents with concern of pain in multiple areas. Pain began yesterday about 24 hours ago, after she had a mechanical fall while feeding the cat. Since that time she said pain in the left foot primarily, though also in the left hand. Two weeks ago, the patient had a fall, resulting left rib pain. Since yesterday, no new chest pain, dyspnea, syncope, fever, chills, loss of sensation in the hand or foot. No relief with OTC medication.   Past Medical History  Diagnosis Date  . Type 2 diabetes mellitus   . Hyperlipidemia   . Spinal stenosis   . Depression   . Glaucoma   . Nephrolithiasis   . Hydrocephalus   . Coronary atherosclerosis of native coronary artery     Multivessel status post CABG   Past Surgical History  Procedure Laterality Date  . Back surgery    . Kyphoplasty    . Foot surgery    . Ventriculoperitoneal shunt    . Appendectomy    . Coronary artery bypass graft  11/24/1991    SVG to OM1, SVG to RCA   Family History  Problem Relation Age of Onset  . Cancer Father   . Diabetes Brother   . Heart disease Brother   . Hyperlipidemia Daughter    History  Substance Use Topics  . Smoking status: Former Smoker -- 2.00 packs/day    Types: Cigarettes    Start date: 01/15/1958    Quit date: 12/17/1993  . Smokeless tobacco: Never Used  . Alcohol Use: No   OB History    Gravida Para Term Preterm AB TAB SAB Ectopic Multiple Living   3 3        3      Review of Systems  Constitutional:       Per HPI, otherwise negative  HENT:       Per HPI, otherwise negative  Respiratory:       Per HPI, otherwise negative  Cardiovascular:       Per HPI, otherwise negative  Gastrointestinal: Negative for vomiting.  Endocrine:       Negative aside from HPI  Genitourinary:       Neg aside  from HPI   Musculoskeletal:       Per HPI, otherwise negative  Skin: Positive for color change and wound.  Neurological: Negative for syncope.      Allergies  Codeine; Penicillins; and Sulfa antibiotics  Home Medications   Prior to Admission medications   Medication Sig Start Date End Date Taking? Authorizing Provider  ALPRAZolam (XANAX) 0.25 MG tablet Take 0.25 mg by mouth at bedtime as needed.  07/24/14  Yes Historical Provider, MD  aspirin EC 81 MG tablet Take 81 mg by mouth daily.   Yes Historical Provider, MD  Choline Fenofibrate 135 MG capsule Take 135 mg by mouth daily.   Yes Historical Provider, MD  dorzolamide-timolol (COSOPT) 22.3-6.8 MG/ML ophthalmic solution Place 1 drop into both eyes 2 (two) times daily.  10/30/14  Yes Historical Provider, MD  gabapentin (NEURONTIN) 100 MG capsule Take 100 mg by mouth at bedtime.  09/29/14  Yes Historical Provider, MD  latanoprost (XALATAN) 0.005 % ophthalmic solution Place 1 drop into both eyes at bedtime.   Yes Historical Provider, MD  Milnacipran (SAVELLA) 50 MG TABS Take 50 mg by mouth 2 (two) times daily.   Yes Historical Provider, MD  Multiple Vitamins-Minerals (MULTIVITAMIN WITH MINERALS) tablet Take 1 tablet by mouth daily.   Yes Historical Provider, MD  nitroGLYCERIN (NITROSTAT) 0.4 MG SL tablet Place 1 tablet (0.4 mg total) under the tongue every 5 (five) minutes as needed for chest pain. 10/15/14  Yes Satira Sark, MD  pantoprazole (PROTONIX) 40 MG tablet Take 40 mg by mouth daily.  08/31/13  Yes Historical Provider, MD  pioglitazone (ACTOS) 15 MG tablet Take 15 mg by mouth daily.  10/03/14  Yes Historical Provider, MD  rosuvastatin (CRESTOR) 20 MG tablet Take 20 mg by mouth daily.   Yes Historical Provider, MD  sitaGLIPtan-metformin (JANUMET) 50-1000 MG per tablet Take 1 tablet by mouth 2 (two) times daily with a meal.   Yes Historical Provider, MD  traMADol (ULTRAM) 50 MG tablet Take 1 tablet (50 mg total) by mouth every 6  (six) hours as needed for severe pain. 04/28/15   Carmin Muskrat, MD   BP 117/73 mmHg  Pulse 97  Temp(Src) 98.2 F (36.8 C) (Oral)  Resp 18  Ht 5\' 3"  (1.6 m)  Wt 162 lb (73.483 kg)  BMI 28.70 kg/m2  SpO2 91% Physical Exam  Constitutional: She is oriented to person, place, and time. She appears well-developed and well-nourished. No distress.  HENT:  Head: Normocephalic and atraumatic.  Eyes: Conjunctivae and EOM are normal.  Cardiovascular: Normal rate and regular rhythm.   Pulmonary/Chest: Effort normal and breath sounds normal. No stridor. No respiratory distress.  Abdominal: She exhibits no distension.  Musculoskeletal: She exhibits no edema.       Hands:      Feet:  Neurological: She is alert and oriented to person, place, and time. No cranial nerve deficit.  Skin: Skin is warm and dry.  Psychiatric: She has a normal mood and affect.  Nursing note and vitals reviewed.   ED Course  Procedures (including critical care time) Labs Review Labs Reviewed - No data to display  Imaging Review Dg Ribs Unilateral W/chest Left  04/28/2015   CLINICAL DATA:  Fall.  Left chest pain  EXAM: LEFT RIBS AND CHEST - 3+ VIEW  COMPARISON:  10/19/2012  FINDINGS: CABG changes. Negative for heart failure. Ventricular peritoneal shunt tubing on the right. Lungs are clear without infiltrate or effusion. No pneumothorax.  Several nondisplaced rib fractures are present anteriorly and laterally on the left. Left sixth and eighth ribs are fractured.  IMPRESSION: Nondisplaced fractures left sixth and eighth   ribs.  Lungs are clear.   Electronically Signed   By: Franchot Gallo M.D.   On: 04/28/2015 12:18   Dg Hand Complete Left  04/28/2015   CLINICAL DATA:  Left hand pain and bruising secondary to a fall.  EXAM: LEFT HAND - COMPLETE 3+ VIEW  COMPARISON:  None.  FINDINGS: There is no evidence of fracture or dislocation. There are arthritic changes of the IP joints of the fingers and thumb. Minimal arthritis  of the first carpal metacarpal joint.  IMPRESSION: No acute abnormality.  Arthritis as described.   Electronically Signed   By: Lorriane Shire M.D.   On: 04/28/2015 12:17   Dg Foot Complete Left  04/28/2015   CLINICAL DATA:  Left foot pain.  Fall.  Initial evaluation.  EXAM: LEFT FOOT - COMPLETE 3+ VIEW  COMPARISON:  None.  FINDINGS: Diffuse osteopenia. No acute bony or joint abnormality identified. No radiopaque  foreign body.  IMPRESSION: Diffuse osteopenia.  No acute abnormality.   Electronically Signed   By: Marcello Moores  Register   On: 04/28/2015 12:14    I reviewed the results (including imaging as performed), agree with the interpretation  On repeat exam the patient appears better.  We reviewed all findings.   MDM   Final diagnoses:  Fall, initial encounter  Left foot pain  Rib fractures, left, closed, initial encounter   Patient presents after 2 falls in 2 weeks, most recently yesterday, with pain in multiple areas, but no evidence for hemodynamic compromise, though the patient has mild hypoxia. X-rays today did not show pneumonia, pneumothorax. We had a lengthy conversation about using incentive spirometry, appropriate pain medication, for both the foot pain and her previously fractured ribs. Patient will follow-up with primary care.   Carmin Muskrat, MD 04/28/15 2139

## 2015-04-28 NOTE — ED Notes (Addendum)
Pt states she fell yesterday at 1500 while feeding her cat outside. Pain to left foot with bruising. Bruising and swelling to left hand as well. Family member also states fall 2 weeks ago and that pt has been c/o left rib pain.

## 2015-05-03 ENCOUNTER — Telehealth: Payer: Self-pay | Admitting: Orthopedic Surgery

## 2015-05-03 NOTE — Telephone Encounter (Signed)
Patient called to relay that she was going to schedule appointment with Dr [name] following Emergency room visit, however, said that she was also advised to see primary care doctor; therefore, will see primary care only for now.

## 2015-05-10 ENCOUNTER — Telehealth (HOSPITAL_COMMUNITY): Payer: Self-pay | Admitting: Physical Therapy

## 2015-05-10 NOTE — Telephone Encounter (Signed)
Patient called to say she seen the MD today and he said it would take 4 - 6 weeks for her broken ribs to mend. She wants to be D/C and  she will call  back if she wants to reschedule at a later date.

## 2015-05-13 DIAGNOSIS — K5900 Constipation, unspecified: Secondary | ICD-10-CM | POA: Diagnosis not present

## 2015-05-24 DIAGNOSIS — Z780 Asymptomatic menopausal state: Secondary | ICD-10-CM | POA: Diagnosis not present

## 2015-05-24 DIAGNOSIS — Z13820 Encounter for screening for osteoporosis: Secondary | ICD-10-CM | POA: Diagnosis not present

## 2015-07-01 DIAGNOSIS — E119 Type 2 diabetes mellitus without complications: Secondary | ICD-10-CM | POA: Diagnosis not present

## 2015-07-27 DIAGNOSIS — H04129 Dry eye syndrome of unspecified lacrimal gland: Secondary | ICD-10-CM | POA: Diagnosis not present

## 2015-07-27 DIAGNOSIS — H5703 Miosis: Secondary | ICD-10-CM | POA: Diagnosis not present

## 2015-07-27 DIAGNOSIS — H4011X2 Primary open-angle glaucoma, moderate stage: Secondary | ICD-10-CM | POA: Diagnosis not present

## 2015-07-27 DIAGNOSIS — D3140 Benign neoplasm of unspecified ciliary body: Secondary | ICD-10-CM | POA: Diagnosis not present

## 2015-08-02 ENCOUNTER — Ambulatory Visit: Payer: Medicare Other | Admitting: Adult Health

## 2015-09-19 DIAGNOSIS — E119 Type 2 diabetes mellitus without complications: Secondary | ICD-10-CM | POA: Diagnosis not present

## 2015-10-01 DIAGNOSIS — H401192 Primary open-angle glaucoma, unspecified eye, moderate stage: Secondary | ICD-10-CM | POA: Diagnosis not present

## 2015-10-01 DIAGNOSIS — H401122 Primary open-angle glaucoma, left eye, moderate stage: Secondary | ICD-10-CM | POA: Diagnosis not present

## 2015-10-01 DIAGNOSIS — H43813 Vitreous degeneration, bilateral: Secondary | ICD-10-CM | POA: Diagnosis not present

## 2015-10-01 DIAGNOSIS — E119 Type 2 diabetes mellitus without complications: Secondary | ICD-10-CM | POA: Diagnosis not present

## 2015-10-01 DIAGNOSIS — Z961 Presence of intraocular lens: Secondary | ICD-10-CM | POA: Diagnosis not present

## 2015-10-01 DIAGNOSIS — H401112 Primary open-angle glaucoma, right eye, moderate stage: Secondary | ICD-10-CM | POA: Diagnosis not present

## 2015-10-31 DIAGNOSIS — E782 Mixed hyperlipidemia: Secondary | ICD-10-CM | POA: Diagnosis not present

## 2015-10-31 DIAGNOSIS — E1165 Type 2 diabetes mellitus with hyperglycemia: Secondary | ICD-10-CM | POA: Diagnosis not present

## 2015-11-14 DIAGNOSIS — E119 Type 2 diabetes mellitus without complications: Secondary | ICD-10-CM | POA: Diagnosis not present

## 2015-11-14 DIAGNOSIS — R0902 Hypoxemia: Secondary | ICD-10-CM | POA: Diagnosis not present

## 2015-11-14 DIAGNOSIS — E782 Mixed hyperlipidemia: Secondary | ICD-10-CM | POA: Diagnosis not present

## 2015-11-14 DIAGNOSIS — D509 Iron deficiency anemia, unspecified: Secondary | ICD-10-CM | POA: Diagnosis not present

## 2015-11-17 IMAGING — DX DG FOOT COMPLETE 3+V*L*
3 series · 3 of 3 positions shown · non-contrast
Comparison: None.

CLINICAL DATA: Left foot pain.  Fall.  Initial evaluation.

EXAM:
LEFT FOOT - COMPLETE 3+ VIEW

[foot ap]
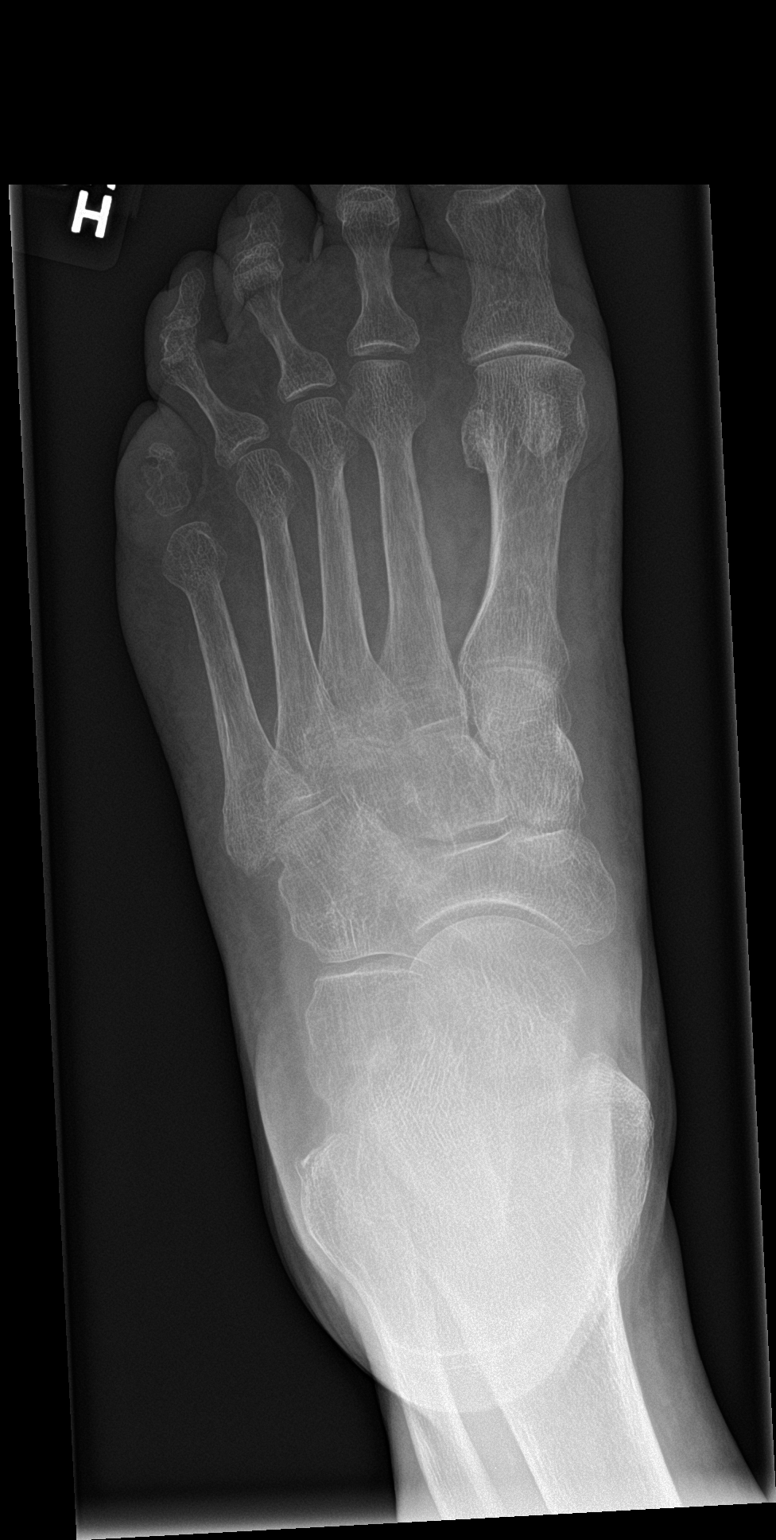

[foot obl]
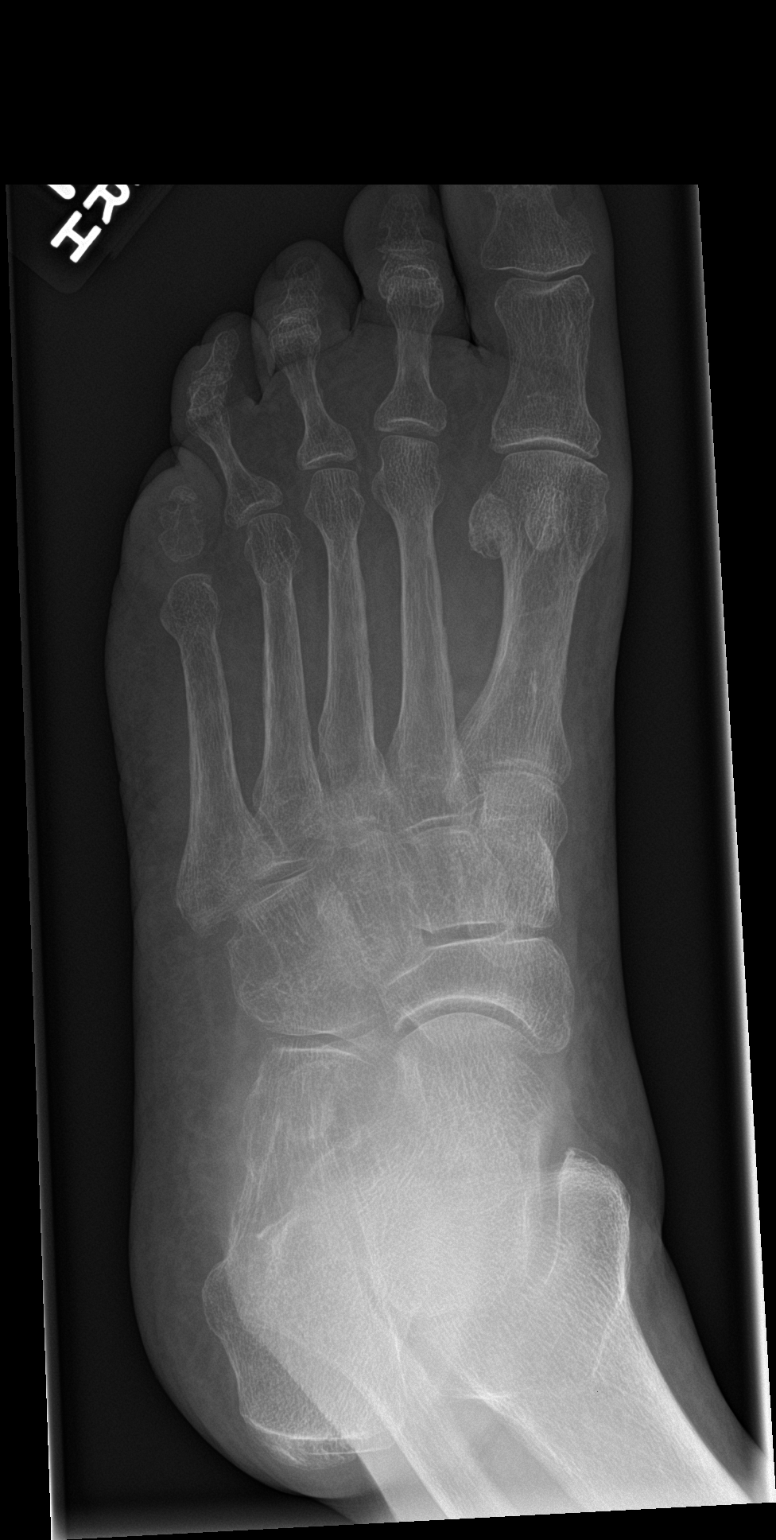

[foot lat]
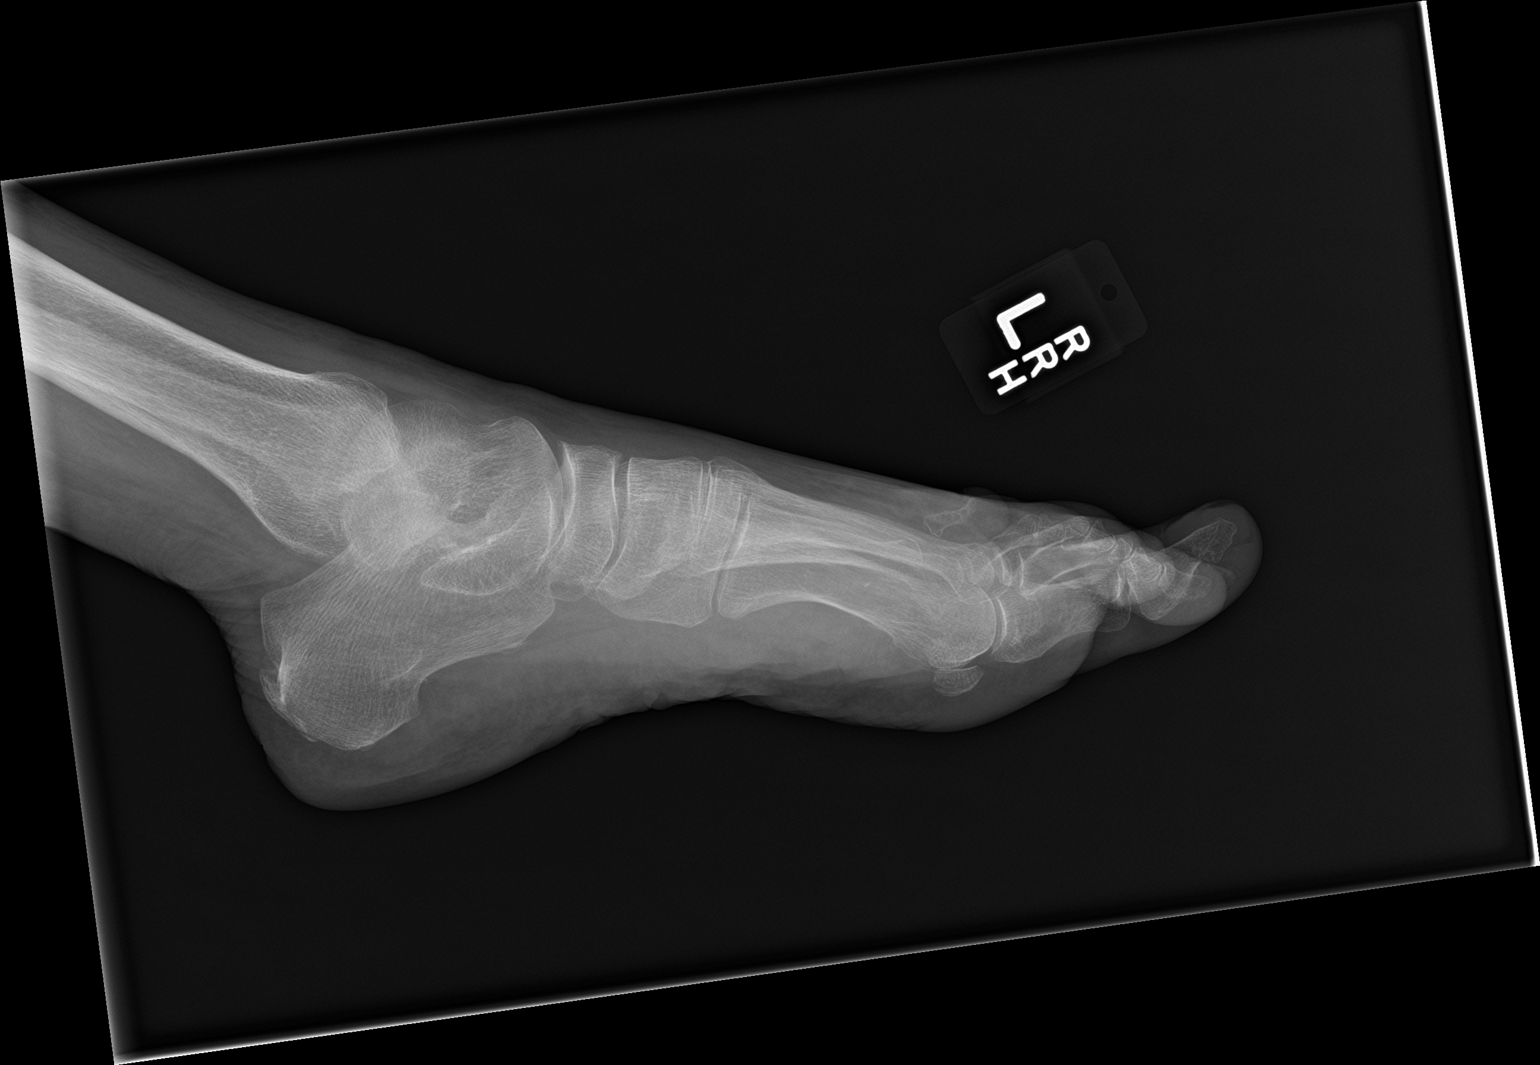

[3 of 3 positions shown; findings below may reference images not displayed]

FINDINGS: Diffuse osteopenia. No acute bony or joint abnormality identified.
No radiopaque foreign body.
IMPRESSION: Diffuse osteopenia.  No acute abnormality.

## 2015-11-17 IMAGING — DX DG RIBS W/ CHEST 3+V*L*
4 series · 4 of 4 positions shown · non-contrast
Comparison: 10/19/2012

CLINICAL DATA: Fall.  Left chest pain

EXAM:
LEFT RIBS AND CHEST - 3+ VIEW

[chest pa]
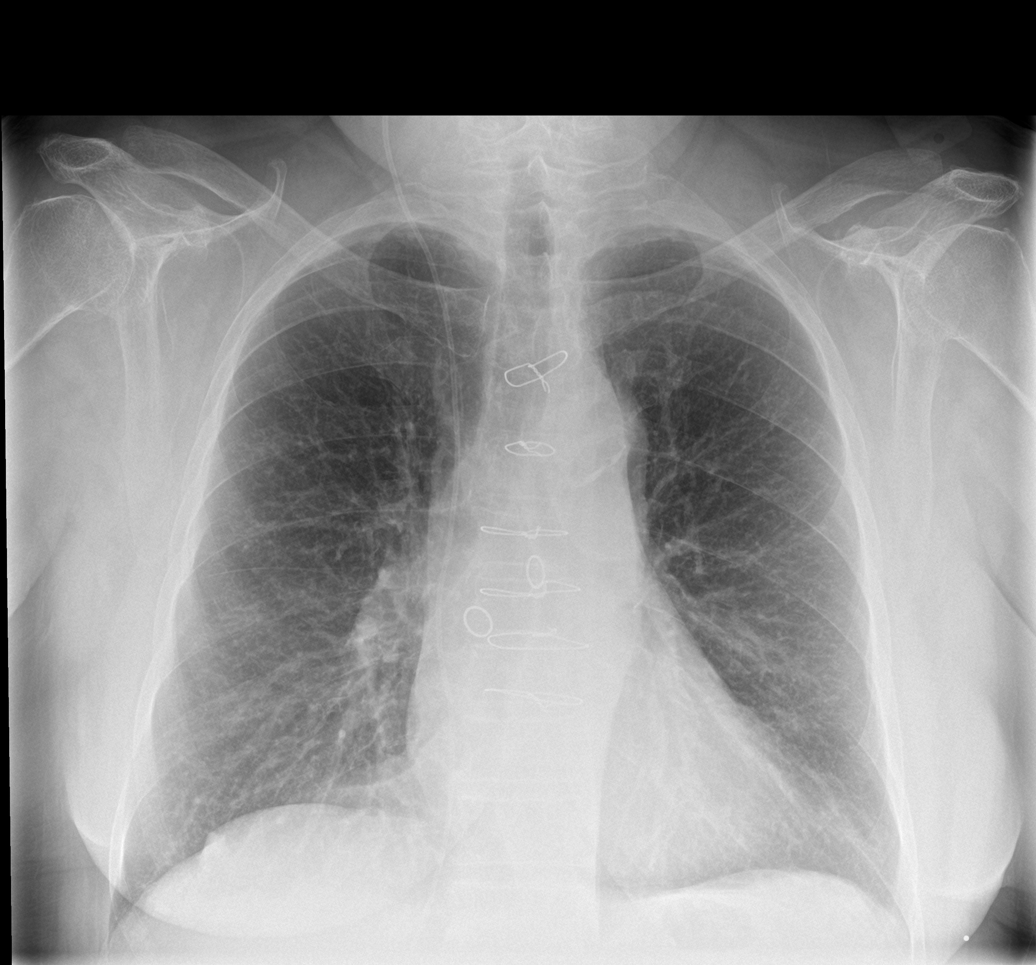

[rib pa (1 of 2)]
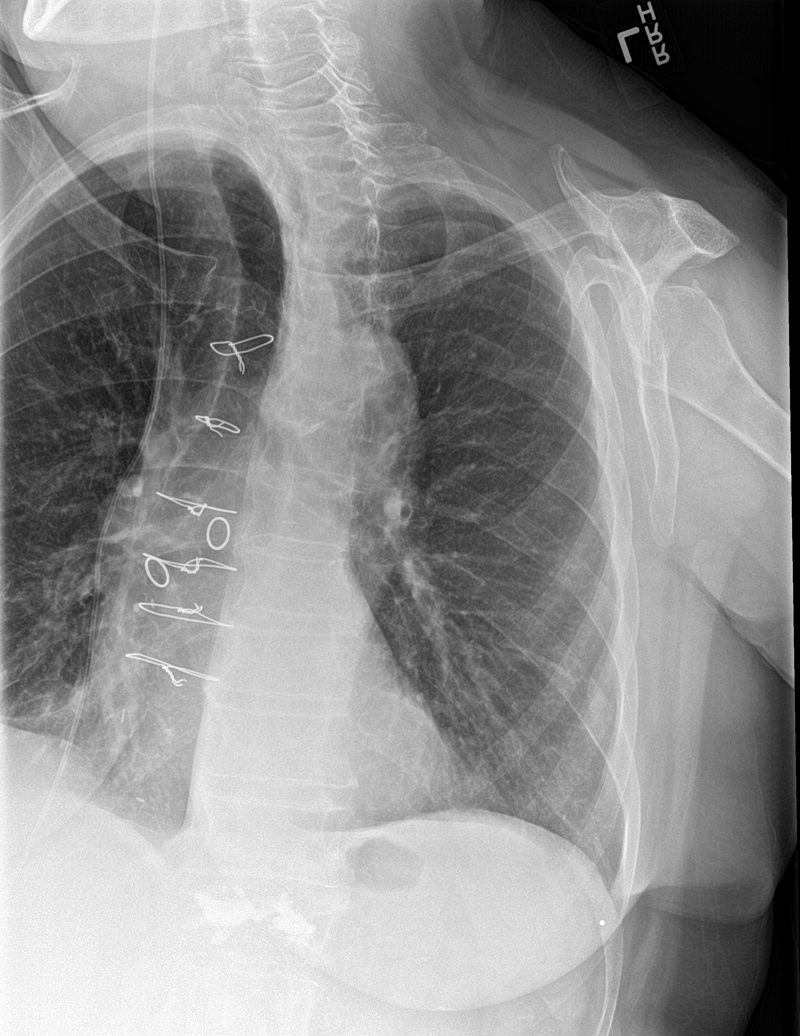

[rib pa (2 of 2)]
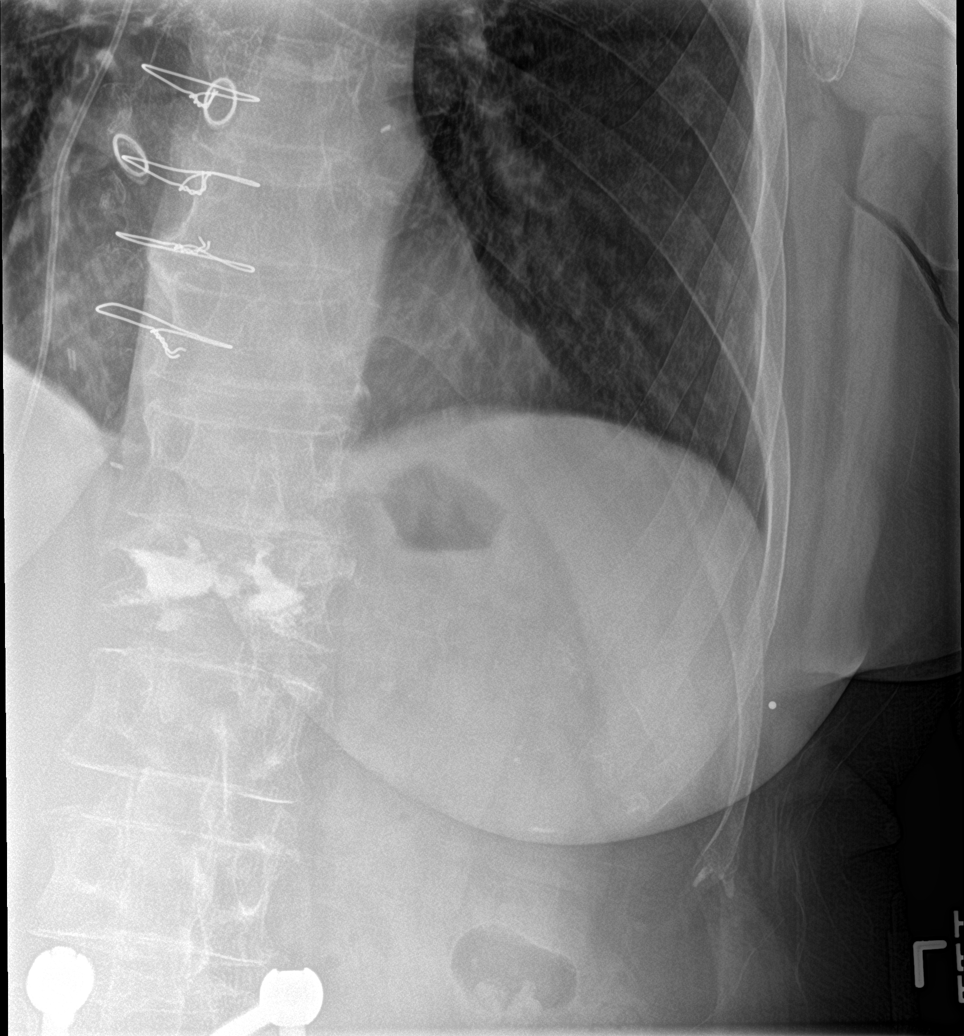

[rib pa obl]
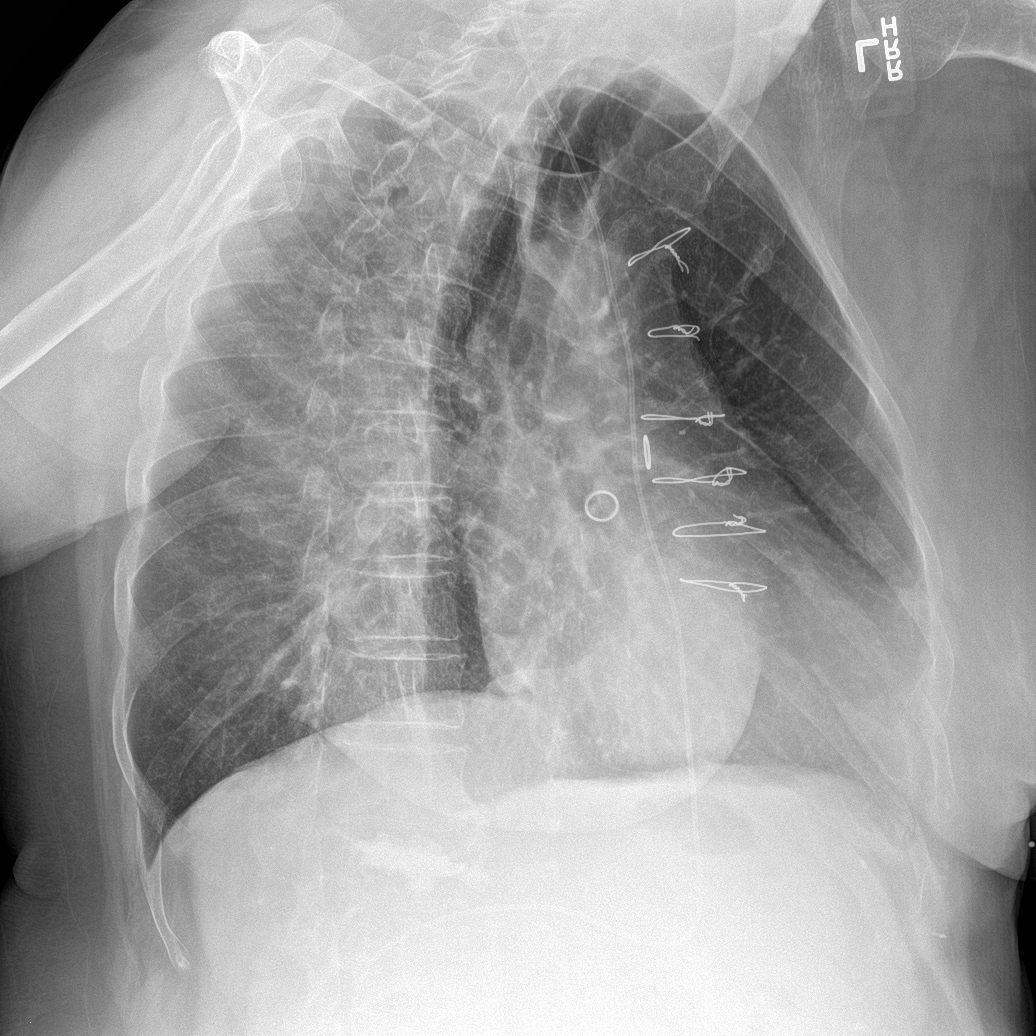

[4 of 4 positions shown; findings below may reference images not displayed]

FINDINGS: CABG changes. Negative for heart failure. Ventricular peritoneal
shunt tubing on the right. Lungs are clear without infiltrate or
effusion. No pneumothorax.

Several nondisplaced rib fractures are present anteriorly and
laterally on the left. Left sixth and eighth ribs are fractured.
IMPRESSION: Nondisplaced fractures left sixth and eighth   ribs.

Lungs are clear.

## 2015-11-18 ENCOUNTER — Other Ambulatory Visit (HOSPITAL_COMMUNITY): Payer: Self-pay | Admitting: Respiratory Therapy

## 2015-11-18 DIAGNOSIS — R0902 Hypoxemia: Secondary | ICD-10-CM

## 2015-11-23 ENCOUNTER — Ambulatory Visit (HOSPITAL_COMMUNITY): Admission: RE | Admit: 2015-11-23 | Payer: Medicare Other | Source: Ambulatory Visit

## 2015-11-23 DIAGNOSIS — R269 Unspecified abnormalities of gait and mobility: Secondary | ICD-10-CM | POA: Diagnosis not present

## 2015-11-23 DIAGNOSIS — Z6830 Body mass index (BMI) 30.0-30.9, adult: Secondary | ICD-10-CM | POA: Diagnosis not present

## 2015-11-23 DIAGNOSIS — G919 Hydrocephalus, unspecified: Secondary | ICD-10-CM | POA: Diagnosis not present

## 2015-12-15 DIAGNOSIS — E782 Mixed hyperlipidemia: Secondary | ICD-10-CM | POA: Diagnosis not present

## 2015-12-15 DIAGNOSIS — K5900 Constipation, unspecified: Secondary | ICD-10-CM | POA: Diagnosis not present

## 2015-12-15 DIAGNOSIS — D509 Iron deficiency anemia, unspecified: Secondary | ICD-10-CM | POA: Diagnosis not present

## 2016-01-03 DIAGNOSIS — E119 Type 2 diabetes mellitus without complications: Secondary | ICD-10-CM | POA: Diagnosis not present

## 2016-02-03 ENCOUNTER — Ambulatory Visit (INDEPENDENT_AMBULATORY_CARE_PROVIDER_SITE_OTHER): Payer: Medicare Other | Admitting: Cardiology

## 2016-02-03 ENCOUNTER — Encounter: Payer: Self-pay | Admitting: Cardiology

## 2016-02-03 VITALS — BP 108/60 | HR 87 | Ht 63.0 in | Wt 171.0 lb

## 2016-02-03 DIAGNOSIS — I25810 Atherosclerosis of coronary artery bypass graft(s) without angina pectoris: Secondary | ICD-10-CM | POA: Diagnosis not present

## 2016-02-03 DIAGNOSIS — E782 Mixed hyperlipidemia: Secondary | ICD-10-CM

## 2016-02-03 DIAGNOSIS — I25119 Atherosclerotic heart disease of native coronary artery with unspecified angina pectoris: Secondary | ICD-10-CM

## 2016-02-03 MED ORDER — NITROGLYCERIN 0.4 MG SL SUBL: 0.4000 mg | SUBLINGUAL_TABLET | SUBLINGUAL | Status: AC | PRN

## 2016-02-03 NOTE — Progress Notes (Signed)
Cardiology Office Note  Date: 02/03/2016   ID: [name], DOB 80-Jan-1934, MRN [id number]  PCP: [name] Neighbors, MD  Primary Cardiologist: [name] Lesches, MD   Chief Complaint  Patient presents with  . Coronary Artery Disease    History of Present Illness: [name] is an 82 y.o. female not seen since October 2015. She presents for a follow-up visit. Primary care continues with [name]. She does report occasional angina symptoms depending on level of activity, but these resolve quickly with rest or a single nitroglycerin. She denies any escalating pattern. Typically has NYHA class II dyspnea.  I reviewed her medications. Cardiac regimen includes aspirin, Crestor, and as needed nitroglycerin. Blood pressure is low normal at baseline and she is not on any antihypertensives.  ECG today shows sinus rhythm with non-specific T wave changes.  Prior cardiac testing is outlined below. She prefers to hold off on follow-up testing and observe on medical therapy as long as her symptoms do not escalate. She also states that she will be having follow-up lab work in the near future including lipid panel with [name].  Past Medical History  Diagnosis Date  . Type 2 diabetes mellitus ([place])   . Hyperlipidemia   . Spinal stenosis   . Depression   . Glaucoma   . Nephrolithiasis   . Hydrocephalus   . Coronary atherosclerosis of native coronary artery     Multivessel status post CABG    Past Surgical History  Procedure Laterality Date  . Back surgery    . Kyphoplasty    . Foot surgery    . Ventriculoperitoneal shunt    . Appendectomy    . Coronary artery bypass graft  11/24/1991    SVG to OM1, SVG to RCA    Current Outpatient Prescriptions  Medication Sig Dispense Refill  . ALPRAZolam (XANAX) 0.25 MG tablet Take 0.25 mg by mouth at bedtime as needed.     Marland Kitchen aspirin EC 81 MG tablet Take 81 mg by mouth daily.    . Choline Fenofibrate 135 MG capsule Take 135 mg by mouth daily.     . dorzolamide-timolol (COSOPT) 22.3-6.8 MG/ML ophthalmic solution Place 1 drop into both eyes 2 (two) times daily.     Marland Kitchen gabapentin (NEURONTIN) 100 MG capsule Take 100 mg by mouth at bedtime.     Marland Kitchen latanoprost (XALATAN) 0.005 % ophthalmic solution Place 1 drop into both eyes at bedtime.    . Milnacipran (SAVELLA) 50 MG TABS Take 50 mg by mouth 2 (two) times daily.    . Multiple Vitamins-Minerals (MULTIVITAMIN WITH MINERALS) tablet Take 1 tablet by mouth daily.    . nitroGLYCERIN (NITROSTAT) 0.4 MG SL tablet Place 1 tablet (0.4 mg total) under the tongue every 5 (five) minutes as needed for chest pain. 25 tablet 3  . pantoprazole (PROTONIX) 40 MG tablet Take 40 mg by mouth daily.     . pioglitazone (ACTOS) 15 MG tablet Take 15 mg by mouth daily.     . rosuvastatin (CRESTOR) 20 MG tablet Take 20 mg by mouth daily.    . sitaGLIPtan-metformin (JANUMET) 50-1000 MG per tablet Take 1 tablet by mouth 2 (two) times daily with a meal.    . traMADol (ULTRAM) 50 MG tablet Take 1 tablet (50 mg total) by mouth every 6 (six) hours as needed for severe pain. 15 tablet 0   No current facility-administered medications for this visit.   Allergies:  Codeine; Penicillins; and Sulfa antibiotics  Social History: The patient  reports that she quit smoking about 22 years ago. Her smoking use included Cigarettes. She started smoking about 58 years ago. She smoked 2.00 packs per day. She has never used smokeless tobacco. She reports that she does not drink alcohol or use illicit drugs.   ROS:  Please see the history of present illness. Otherwise, complete review of systems is positive for arthritis pains, gait instability. She uses a rolling walker. All other systems are reviewed and negative.   Physical Exam: VS:  BP 108/60 mmHg  Pulse 87  Ht 5\' 3"  (1.6 m)  Wt 171 lb (77.565 kg)  BMI 30.30 kg/m2  SpO2 95%, BMI Body mass index is 30.3 kg/(m^2).  Wt Readings from Last 3 Encounters:  02/03/16 171 lb (77.565 kg)   04/28/15 162 lb (73.483 kg)  02/08/15 167 lb 8 oz (75.978 kg)    General: Elderly woman, appears comfortable at rest. HEENT: Conjunctiva and lids normal, oropharynx clear. Neck: Supple, no elevated JVP or carotid bruits, no thyromegaly. Lungs: Clear to auscultation, nonlabored breathing at rest. Cardiac: Regular rate and rhythm, no S3, 2/6 systolic murmur, no pericardial rub. Abdomen: Soft, nontender, bowel sounds present, no guarding or rebound. Extremities: No pitting edema, distal pulses 2+. Skin: Warm and dry. Musculoskeletal: Mild kyphosis. Neuropsychiatric: Alert and oriented x3, affect grossly appropriate.  ECG: I personally reviewed the prior tracing from 10/18/2014 which showed sinus rhythm with PACs and nonspecific ST changes.  Other Studies Reviewed Today:  Persantine Myoview June 2012: Evidence of mild basal inferior and inferolateral ischemia with LVEF 65%, overall low risk.   Echocardiogram April 2009: Mild LVH with LVEF greater than [id number], diastolic dysfunction, MAC with trace mitral regurgitation, trace tricuspid regurgitation, PASP 37 mmHg.  Assessment and Plan:  1. Symptomatically stable multivessel CAD status post CABG. She reports no progression in angina symptoms and is comfortable with observation for now. I reviewed her ECG. Can certainly pursue follow-up ischemic evaluation if symptoms worsen.  2. Hyperlipidemia, on Crestor. Due for follow-up lipids with [name].  Current medicines were reviewed with the patient today.   Orders Placed This Encounter  Procedures  . EKG 12-Lead    Disposition: FU with me in 1 year.   Signed, [name] Sark, MD, [facility] 02/03/2016 4:31 PM    [name] at [facility] [address], [place], [place] [address] Phone: [phone]; Fax: [phone]

## 2016-02-03 NOTE — Patient Instructions (Signed)
Your physician wants you to follow-up in: 1 year with Dr McDowell You will receive a reminder letter in the mail two months in advance. If you don't receive a letter, please call our office to schedule the follow-up appointment.   Your physician recommends that you continue on your current medications as directed. Please refer to the Current Medication list given to you today.     Thank you for choosing [facility] !        

## 2016-02-07 DIAGNOSIS — D509 Iron deficiency anemia, unspecified: Secondary | ICD-10-CM | POA: Diagnosis not present

## 2016-02-07 DIAGNOSIS — E119 Type 2 diabetes mellitus without complications: Secondary | ICD-10-CM | POA: Diagnosis not present

## 2016-02-07 DIAGNOSIS — E782 Mixed hyperlipidemia: Secondary | ICD-10-CM | POA: Diagnosis not present

## 2016-02-10 DIAGNOSIS — R0902 Hypoxemia: Secondary | ICD-10-CM | POA: Diagnosis not present

## 2016-02-10 DIAGNOSIS — D509 Iron deficiency anemia, unspecified: Secondary | ICD-10-CM | POA: Diagnosis not present

## 2016-02-10 DIAGNOSIS — K5900 Constipation, unspecified: Secondary | ICD-10-CM | POA: Diagnosis not present

## 2016-02-10 DIAGNOSIS — E119 Type 2 diabetes mellitus without complications: Secondary | ICD-10-CM | POA: Diagnosis not present

## 2016-02-10 DIAGNOSIS — E782 Mixed hyperlipidemia: Secondary | ICD-10-CM | POA: Diagnosis not present

## 2016-02-28 DIAGNOSIS — R0902 Hypoxemia: Secondary | ICD-10-CM | POA: Diagnosis not present

## 2016-03-02 DIAGNOSIS — R404 Transient alteration of awareness: Secondary | ICD-10-CM | POA: Diagnosis not present

## 2016-03-02 DIAGNOSIS — R42 Dizziness and giddiness: Secondary | ICD-10-CM | POA: Diagnosis not present

## 2016-03-23 DIAGNOSIS — E119 Type 2 diabetes mellitus without complications: Secondary | ICD-10-CM | POA: Diagnosis not present

## 2016-04-24 ENCOUNTER — Encounter (HOSPITAL_COMMUNITY): Payer: Self-pay | Admitting: Physical Therapy

## 2016-04-24 NOTE — Therapy (Signed)
[place], [place], [address] Phone: [phone]   Fax:  [phone]  Patient Details  Name: [name] MRN: [id number] Date of Birth: Jul 29, 1933 Referring Provider:  No ref. provider found  Encounter Date: 04/24/2016   PHYSICAL THERAPY DISCHARGE SUMMARY  Visits from Start of Care: 17  Current functional level related to goals / functional outcomes: Unable to assess    Remaining deficits: Patient has not returned since last skilled visit, unable to assess    Education / Equipment: N/A  Plan: Patient agrees to discharge.  Patient goals were not met. Patient is being discharged due to not returning since the last visit.  ?????        Deniece Ree PT, DPT [place] [address] [place], [place], [address] Phone: [phone]   Fax:  [phone]

## 2016-05-29 DIAGNOSIS — E119 Type 2 diabetes mellitus without complications: Secondary | ICD-10-CM | POA: Diagnosis not present

## 2016-05-29 DIAGNOSIS — D509 Iron deficiency anemia, unspecified: Secondary | ICD-10-CM | POA: Diagnosis not present

## 2016-05-29 DIAGNOSIS — E782 Mixed hyperlipidemia: Secondary | ICD-10-CM | POA: Diagnosis not present

## 2016-06-02 DIAGNOSIS — E119 Type 2 diabetes mellitus without complications: Secondary | ICD-10-CM | POA: Diagnosis not present

## 2016-06-02 DIAGNOSIS — E782 Mixed hyperlipidemia: Secondary | ICD-10-CM | POA: Diagnosis not present

## 2016-06-02 DIAGNOSIS — I10 Essential (primary) hypertension: Secondary | ICD-10-CM | POA: Diagnosis not present

## 2016-06-04 ENCOUNTER — Other Ambulatory Visit (HOSPITAL_COMMUNITY): Payer: Self-pay | Admitting: Internal Medicine

## 2016-06-04 DIAGNOSIS — Z1231 Encounter for screening mammogram for malignant neoplasm of breast: Secondary | ICD-10-CM

## 2016-06-11 ENCOUNTER — Ambulatory Visit (HOSPITAL_COMMUNITY)
Admission: RE | Admit: 2016-06-11 | Discharge: 2016-06-11 | Disposition: A | Discharge status: Home / Self Care | Payer: Medicare Other | Source: Ambulatory Visit | Attending: Internal Medicine | Admitting: Internal Medicine

## 2016-06-11 ENCOUNTER — Other Ambulatory Visit (HOSPITAL_COMMUNITY): Payer: Self-pay | Admitting: Internal Medicine

## 2016-06-11 DIAGNOSIS — Z1231 Encounter for screening mammogram for malignant neoplasm of breast: Secondary | ICD-10-CM | POA: Diagnosis not present

## 2016-06-29 DIAGNOSIS — E119 Type 2 diabetes mellitus without complications: Secondary | ICD-10-CM | POA: Diagnosis not present

## 2016-07-10 DIAGNOSIS — M1712 Unilateral primary osteoarthritis, left knee: Secondary | ICD-10-CM | POA: Diagnosis not present

## 2016-07-10 DIAGNOSIS — S46911A Strain of unspecified muscle, fascia and tendon at shoulder and upper arm level, right arm, initial encounter: Secondary | ICD-10-CM | POA: Diagnosis not present

## 2016-07-10 DIAGNOSIS — M1711 Unilateral primary osteoarthritis, right knee: Secondary | ICD-10-CM | POA: Diagnosis not present

## 2016-07-10 DIAGNOSIS — M532X7 Spinal instabilities, lumbosacral region: Secondary | ICD-10-CM | POA: Diagnosis not present

## 2016-07-27 ENCOUNTER — Other Ambulatory Visit: Payer: Self-pay | Admitting: Pharmacist

## 2016-07-27 NOTE — Patient Outreach (Signed)
Outreach call to [employer] regarding her request for follow up from the [facility] Medication Adherence Campaign. Called and spoke with patient. HIPAA identifiers verified and verbal consent received.  [name] reports that she has been taking her rosuvastatin 20 mg every night. Denies missing any doses of this medication. Denies any barriers to adherence. Reports that she gets most of her medications as 90 day supplies. However, reports that the pharmacy needs to switch this one from a 30 day supply to a 90 day supply for her. Reports that she has requested that the pharmacy contact her physician to make this change and plans to pick up a 90 day supply with her next refill.  Discuss with patient the option of using mail order pharmacy for cost savings and convenience. Patient reports that she prefers to keep using her local pharmacy for now. Reports that her daughter works near to her pharmacy so that it is easy for her to pick up the patient's medications. Reports that she has all of her medications on automatic refill with the pharmacy.  Patient reports that she has no further medication questions or concerns at this time.  [name], PharmD Clinical Pharmacist [facility] [phone]

## 2016-07-30 DIAGNOSIS — H401112 Primary open-angle glaucoma, right eye, moderate stage: Secondary | ICD-10-CM | POA: Diagnosis not present

## 2016-07-30 DIAGNOSIS — H5703 Miosis: Secondary | ICD-10-CM | POA: Diagnosis not present

## 2016-07-30 DIAGNOSIS — H04123 Dry eye syndrome of bilateral lacrimal glands: Secondary | ICD-10-CM | POA: Diagnosis not present

## 2016-07-30 DIAGNOSIS — H401122 Primary open-angle glaucoma, left eye, moderate stage: Secondary | ICD-10-CM | POA: Diagnosis not present

## 2016-10-01 DIAGNOSIS — E119 Type 2 diabetes mellitus without complications: Secondary | ICD-10-CM | POA: Diagnosis not present

## 2016-10-15 DIAGNOSIS — M532X7 Spinal instabilities, lumbosacral region: Secondary | ICD-10-CM | POA: Diagnosis not present

## 2016-10-26 ENCOUNTER — Encounter (HOSPITAL_COMMUNITY): Payer: Self-pay | Admitting: Emergency Medicine

## 2016-10-26 ENCOUNTER — Other Ambulatory Visit (HOSPITAL_COMMUNITY): Payer: Self-pay | Admitting: Adult Health Nurse Practitioner

## 2016-10-26 ENCOUNTER — Ambulatory Visit (HOSPITAL_COMMUNITY)
Admission: RE | Admit: 2016-10-26 | Discharge: 2016-10-26 | Disposition: A | Discharge status: Home / Self Care | Payer: Medicare Other | Source: Ambulatory Visit | Attending: Adult Health Nurse Practitioner | Admitting: Adult Health Nurse Practitioner

## 2016-10-26 ENCOUNTER — Inpatient Hospital Stay (HOSPITAL_COMMUNITY)
Admission: EM | Admit: 2016-10-26 | Discharge: 2016-11-04 | DRG: 193 | Disposition: A | Discharge status: Home Health | Payer: Medicare Other | Attending: Internal Medicine | Admitting: Internal Medicine

## 2016-10-26 DIAGNOSIS — I2510 Atherosclerotic heart disease of native coronary artery without angina pectoris: Secondary | ICD-10-CM | POA: Diagnosis not present

## 2016-10-26 DIAGNOSIS — J9622 Acute and chronic respiratory failure with hypercapnia: Secondary | ICD-10-CM | POA: Diagnosis not present

## 2016-10-26 DIAGNOSIS — F329 Major depressive disorder, single episode, unspecified: Secondary | ICD-10-CM | POA: Diagnosis present

## 2016-10-26 DIAGNOSIS — J189 Pneumonia, unspecified organism: Secondary | ICD-10-CM | POA: Diagnosis not present

## 2016-10-26 DIAGNOSIS — D649 Anemia, unspecified: Secondary | ICD-10-CM | POA: Diagnosis present

## 2016-10-26 DIAGNOSIS — E872 Acidosis: Secondary | ICD-10-CM | POA: Diagnosis present

## 2016-10-26 DIAGNOSIS — Z8249 Family history of ischemic heart disease and other diseases of the circulatory system: Secondary | ICD-10-CM | POA: Diagnosis not present

## 2016-10-26 DIAGNOSIS — I5033 Acute on chronic diastolic (congestive) heart failure: Secondary | ICD-10-CM | POA: Diagnosis not present

## 2016-10-26 DIAGNOSIS — E1140 Type 2 diabetes mellitus with diabetic neuropathy, unspecified: Secondary | ICD-10-CM | POA: Diagnosis present

## 2016-10-26 DIAGNOSIS — J441 Chronic obstructive pulmonary disease with (acute) exacerbation: Secondary | ICD-10-CM | POA: Diagnosis not present

## 2016-10-26 DIAGNOSIS — J181 Lobar pneumonia, unspecified organism: Secondary | ICD-10-CM | POA: Diagnosis not present

## 2016-10-26 DIAGNOSIS — J9602 Acute respiratory failure with hypercapnia: Secondary | ICD-10-CM | POA: Diagnosis not present

## 2016-10-26 DIAGNOSIS — R059 Cough, unspecified: Secondary | ICD-10-CM

## 2016-10-26 DIAGNOSIS — R0902 Hypoxemia: Secondary | ICD-10-CM | POA: Diagnosis not present

## 2016-10-26 DIAGNOSIS — Z23 Encounter for immunization: Secondary | ICD-10-CM

## 2016-10-26 DIAGNOSIS — Z87891 Personal history of nicotine dependence: Secondary | ICD-10-CM | POA: Diagnosis not present

## 2016-10-26 DIAGNOSIS — I110 Hypertensive heart disease with heart failure: Secondary | ICD-10-CM | POA: Diagnosis present

## 2016-10-26 DIAGNOSIS — K5900 Constipation, unspecified: Secondary | ICD-10-CM | POA: Diagnosis not present

## 2016-10-26 DIAGNOSIS — Z7982 Long term (current) use of aspirin: Secondary | ICD-10-CM

## 2016-10-26 DIAGNOSIS — J9621 Acute and chronic respiratory failure with hypoxia: Secondary | ICD-10-CM | POA: Diagnosis not present

## 2016-10-26 DIAGNOSIS — R918 Other nonspecific abnormal finding of lung field: Secondary | ICD-10-CM | POA: Insufficient documentation

## 2016-10-26 DIAGNOSIS — I5031 Acute diastolic (congestive) heart failure: Secondary | ICD-10-CM | POA: Diagnosis not present

## 2016-10-26 DIAGNOSIS — E782 Mixed hyperlipidemia: Secondary | ICD-10-CM | POA: Diagnosis not present

## 2016-10-26 DIAGNOSIS — M6281 Muscle weakness (generalized): Secondary | ICD-10-CM

## 2016-10-26 DIAGNOSIS — R262 Difficulty in walking, not elsewhere classified: Secondary | ICD-10-CM | POA: Diagnosis present

## 2016-10-26 DIAGNOSIS — R05 Cough: Secondary | ICD-10-CM

## 2016-10-26 DIAGNOSIS — H409 Unspecified glaucoma: Secondary | ICD-10-CM | POA: Diagnosis not present

## 2016-10-26 DIAGNOSIS — Z87442 Personal history of urinary calculi: Secondary | ICD-10-CM

## 2016-10-26 DIAGNOSIS — J449 Chronic obstructive pulmonary disease, unspecified: Secondary | ICD-10-CM | POA: Diagnosis not present

## 2016-10-26 DIAGNOSIS — I10 Essential (primary) hypertension: Secondary | ICD-10-CM | POA: Diagnosis not present

## 2016-10-26 DIAGNOSIS — Z833 Family history of diabetes mellitus: Secondary | ICD-10-CM | POA: Diagnosis not present

## 2016-10-26 DIAGNOSIS — Z951 Presence of aortocoronary bypass graft: Secondary | ICD-10-CM | POA: Diagnosis not present

## 2016-10-26 DIAGNOSIS — Z982 Presence of cerebrospinal fluid drainage device: Secondary | ICD-10-CM | POA: Diagnosis not present

## 2016-10-26 DIAGNOSIS — E119 Type 2 diabetes mellitus without complications: Secondary | ICD-10-CM | POA: Diagnosis not present

## 2016-10-26 DIAGNOSIS — J440 Chronic obstructive pulmonary disease with acute lower respiratory infection: Secondary | ICD-10-CM | POA: Diagnosis not present

## 2016-10-26 DIAGNOSIS — R531 Weakness: Secondary | ICD-10-CM | POA: Diagnosis not present

## 2016-10-26 DIAGNOSIS — I517 Cardiomegaly: Secondary | ICD-10-CM | POA: Diagnosis not present

## 2016-10-26 DIAGNOSIS — R0600 Dyspnea, unspecified: Secondary | ICD-10-CM | POA: Diagnosis not present

## 2016-10-26 DIAGNOSIS — G4700 Insomnia, unspecified: Secondary | ICD-10-CM | POA: Diagnosis not present

## 2016-10-26 DIAGNOSIS — R0602 Shortness of breath: Secondary | ICD-10-CM | POA: Diagnosis not present

## 2016-10-26 LAB — GLUCOSE, CAPILLARY: GLUCOSE-CAPILLARY: 156 mg/dL — AB (ref 65–99)

## 2016-10-26 LAB — BASIC METABOLIC PANEL
Anion gap: 7 (ref 5–15)
BUN: 18 mg/dL (ref 6–20)
CALCIUM: 9.1 mg/dL (ref 8.9–10.3)
CO2: 30 mmol/L (ref 22–32)
Chloride: 97 mmol/L — ABNORMAL LOW (ref 101–111)
Creatinine, Ser: 0.64 mg/dL (ref 0.44–1.00)
GFR calc Af Amer: >60 mL/min (ref >60)
GLUCOSE: 131 mg/dL — AB (ref 65–99)
Potassium: 4.3 mmol/L (ref 3.5–5.1)
Sodium: 134 mmol/L — ABNORMAL LOW (ref 135–145)

## 2016-10-26 LAB — CBC WITH DIFFERENTIAL/PLATELET
Basophils Absolute: 0.1 10*3/uL (ref 0.0–0.1)
Basophils Relative: 1 %
EOS PCT: 3 %
Eosinophils Absolute: 0.3 10*3/uL (ref 0.0–0.7)
HCT: 36.6 % (ref 36.0–46.0)
Hemoglobin: 10 g/dL — ABNORMAL LOW (ref 12.0–15.0)
LYMPHS ABS: 2.7 10*3/uL (ref 0.7–4.0)
LYMPHS PCT: 28 %
MCH: 22 pg — AB (ref 26.0–34.0)
MCHC: 27.3 g/dL — ABNORMAL LOW (ref 30.0–36.0)
MCV: 80.6 fL (ref 78.0–100.0)
MONO ABS: 0.8 10*3/uL (ref 0.1–1.0)
MONOS PCT: 8 %
Neutro Abs: 6 10*3/uL (ref 1.7–7.7)
Neutrophils Relative %: 60 %
Platelets: 441 10*3/uL — ABNORMAL HIGH (ref 150–400)
RBC: 4.54 MIL/uL (ref 3.87–5.11)
RDW: 18.8 % — AB (ref 11.5–15.5)
WBC: 9.9 10*3/uL (ref 4.0–10.5)

## 2016-10-26 MED ORDER — SITAGLIPTIN PHOS-METFORMIN HCL 50-1000 MG PO TABS: 1.0000 | ORAL_TABLET | Freq: Two times a day (BID) | ORAL | Status: DC

## 2016-10-26 MED ORDER — MILNACIPRAN HCL 50 MG PO TABS
50.0000 mg | ORAL_TABLET | Freq: Two times a day (BID) | ORAL | Status: DC
  Administered 2016-10-27 – 2016-11-04 (×17): 50 mg via ORAL
  Filled 2016-10-26 (×24): qty 1

## 2016-10-26 MED ORDER — ENOXAPARIN SODIUM 40 MG/0.4ML ~~LOC~~ SOLN
40.0000 mg | SUBCUTANEOUS | Status: DC
  Administered 2016-10-26 – 2016-11-03 (×9): 40 mg via SUBCUTANEOUS
  Filled 2016-10-26 (×9): qty 0.4

## 2016-10-26 MED ORDER — FENOFIBRATE 160 MG PO TABS
160.0000 mg | ORAL_TABLET | Freq: Every day | ORAL | Status: DC
  Administered 2016-10-27 – 2016-11-04 (×9): 160 mg via ORAL
  Filled 2016-10-26 (×11): qty 1

## 2016-10-26 MED ORDER — DEXTROSE 5 % IV SOLN
1.0000 g | Freq: Once | INTRAVENOUS | Status: AC
  Administered 2016-10-26: 1 g via INTRAVENOUS
  Filled 2016-10-26: qty 10

## 2016-10-26 MED ORDER — INSULIN ASPART 100 UNIT/ML ~~LOC~~ SOLN
0.0000 [IU] | Freq: Three times a day (TID) | SUBCUTANEOUS | Status: DC
  Administered 2016-10-27 – 2016-10-28 (×3): 3 [IU] via SUBCUTANEOUS
  Administered 2016-10-28: 2 [IU] via SUBCUTANEOUS
  Administered 2016-10-28: 3 [IU] via SUBCUTANEOUS
  Administered 2016-10-29 (×2): 5 [IU] via SUBCUTANEOUS
  Administered 2016-10-29: 2 [IU] via SUBCUTANEOUS
  Administered 2016-10-30: 3 [IU] via SUBCUTANEOUS
  Administered 2016-10-30: 5 [IU] via SUBCUTANEOUS
  Administered 2016-10-31: 2 [IU] via SUBCUTANEOUS
  Administered 2016-10-31: 5 [IU] via SUBCUTANEOUS
  Administered 2016-10-31: 3 [IU] via SUBCUTANEOUS
  Administered 2016-11-01: 5 [IU] via SUBCUTANEOUS
  Administered 2016-11-01: 3 [IU] via SUBCUTANEOUS
  Administered 2016-11-01: 8 [IU] via SUBCUTANEOUS
  Administered 2016-11-02: 3 [IU] via SUBCUTANEOUS
  Administered 2016-11-02: 5 [IU] via SUBCUTANEOUS
  Administered 2016-11-02: 8 [IU] via SUBCUTANEOUS
  Administered 2016-11-03: 11 [IU] via SUBCUTANEOUS
  Administered 2016-11-03 – 2016-11-04 (×2): 5 [IU] via SUBCUTANEOUS

## 2016-10-26 MED ORDER — LINAGLIPTIN 5 MG PO TABS
5.0000 mg | ORAL_TABLET | Freq: Every day | ORAL | Status: DC
  Administered 2016-10-27: 5 mg via ORAL
  Filled 2016-10-26: qty 1

## 2016-10-26 MED ORDER — ROSUVASTATIN CALCIUM 20 MG PO TABS
40.0000 mg | ORAL_TABLET | Freq: Every evening | ORAL | Status: DC
  Administered 2016-10-27 – 2016-11-03 (×9): 40 mg via ORAL
  Filled 2016-10-26 (×10): qty 2

## 2016-10-26 MED ORDER — MELATONIN 5 MG PO TABS: 5.0000 mg | ORAL_TABLET | Freq: Every day | ORAL | Status: DC

## 2016-10-26 MED ORDER — METFORMIN HCL 500 MG PO TABS
500.0000 mg | ORAL_TABLET | Freq: Two times a day (BID) | ORAL | Status: DC
  Administered 2016-10-27 (×2): 500 mg via ORAL
  Filled 2016-10-26 (×2): qty 1

## 2016-10-26 MED ORDER — LATANOPROST 0.005 % OP SOLN
1.0000 [drp] | Freq: Every day | OPHTHALMIC | Status: DC
  Administered 2016-10-27 – 2016-11-03 (×9): 1 [drp] via OPHTHALMIC
  Filled 2016-10-26: qty 2.5

## 2016-10-26 MED ORDER — DEXTROSE 5 % IV SOLN: 500.0000 mg | INTRAVENOUS | Status: DC

## 2016-10-26 MED ORDER — MAGNESIUM HYDROXIDE 400 MG/5ML PO SUSP
30.0000 mL | ORAL | Status: DC
  Filled 2016-10-26: qty 30

## 2016-10-26 MED ORDER — SODIUM CHLORIDE 0.9 % IV BOLUS (SEPSIS)
1000.0000 mL | Freq: Once | INTRAVENOUS | Status: AC
  Administered 2016-10-26: 1000 mL via INTRAVENOUS

## 2016-10-26 MED ORDER — PANTOPRAZOLE SODIUM 40 MG PO TBEC
40.0000 mg | DELAYED_RELEASE_TABLET | Freq: Every day | ORAL | Status: DC
  Administered 2016-10-27 – 2016-11-04 (×9): 40 mg via ORAL
  Filled 2016-10-26 (×9): qty 1

## 2016-10-26 MED ORDER — DEXTROSE 5 % IV SOLN
1.0000 g | INTRAVENOUS | Status: AC
  Administered 2016-10-27 – 2016-11-02 (×7): 1 g via INTRAVENOUS
  Filled 2016-10-26 (×7): qty 10

## 2016-10-26 MED ORDER — GABAPENTIN 100 MG PO CAPS
100.0000 mg | ORAL_CAPSULE | Freq: Three times a day (TID) | ORAL | Status: DC
  Administered 2016-10-26 – 2016-11-04 (×26): 100 mg via ORAL
  Filled 2016-10-26 (×26): qty 1

## 2016-10-26 MED ORDER — DEXTROSE 5 % IV SOLN
500.0000 mg | INTRAVENOUS | Status: DC
  Administered 2016-10-26 – 2016-10-29 (×4): 500 mg via INTRAVENOUS
  Filled 2016-10-26 (×6): qty 500

## 2016-10-26 MED ORDER — ASPIRIN EC 81 MG PO TBEC
81.0000 mg | DELAYED_RELEASE_TABLET | Freq: Every day | ORAL | Status: DC
  Administered 2016-10-27 – 2016-11-04 (×9): 81 mg via ORAL
  Filled 2016-10-26 (×9): qty 1

## 2016-10-26 MED ORDER — IPRATROPIUM-ALBUTEROL 0.5-2.5 (3) MG/3ML IN SOLN
3.0000 mL | Freq: Once | RESPIRATORY_TRACT | Status: AC
  Administered 2016-10-26: 3 mL via RESPIRATORY_TRACT
  Filled 2016-10-26: qty 3

## 2016-10-26 MED ORDER — DORZOLAMIDE HCL-TIMOLOL MAL 2-0.5 % OP SOLN
1.0000 [drp] | Freq: Two times a day (BID) | OPHTHALMIC | Status: DC
  Administered 2016-10-27 – 2016-11-04 (×14): 1 [drp] via OPHTHALMIC
  Filled 2016-10-26 (×3): qty 10

## 2016-10-26 MED ORDER — PIOGLITAZONE HCL 15 MG PO TABS
15.0000 mg | ORAL_TABLET | Freq: Every day | ORAL | Status: DC
  Administered 2016-10-27: 15 mg via ORAL
  Filled 2016-10-26 (×3): qty 1

## 2016-10-26 NOTE — ED Notes (Signed)
Pt oxygen 77% on RA. Pt placed on 2L. Oxygen now 95%.

## 2016-10-26 NOTE — H&P (Signed)
History and Physical  [name] [id number] DOB: 1933/04/23 DOA: 10/26/2016  Referring physician: Dr Lacinda Axon, ED physician PCP: Wende Neighbors, MD  Outpatient Specialists:   Domenic Polite (cardiology)  Chief Complaint: Cough  HPI: [name] is a 82 y.o. female with a history of hyperlipidemia, coronary artery disease status post multivessel CABG, depression, hypertension, diabetes type 2. Patient has been having a productive cough with sputum for the past couple of weeks that has been increasing. Patient has been increasing shortness of breath, particularly with exertion and improved with rest. Patient saw her primary care physician today, who obtained a chest x-ray. After diagnosing her with pneumonia, the patient was instructed to come to the emergency department for evaluation.   Review of Systems:   She complains of chronic leg weakness  Pt denies any fevers, chills, nausea, vomiting, diarrhea, constipation, abdominal pain, wheezing, palpitations, headache, vision changes, lightheadedness, dizziness, melena, rectal bleeding.  Review of systems are otherwise negative  Past Medical History:  Diagnosis Date  . Coronary atherosclerosis of native coronary artery    Multivessel status post CABG  . Depression   . Glaucoma   . Hydrocephalus   . Hyperlipidemia   . Nephrolithiasis   . Spinal stenosis   . Type 2 diabetes mellitus ([place])    Past Surgical History:  Procedure Laterality Date  . APPENDECTOMY    . BACK SURGERY    . CORONARY ARTERY BYPASS GRAFT  11/24/1991   SVG to OM1, SVG to RCA  . FOOT SURGERY    . KYPHOPLASTY    . VENTRICULOPERITONEAL SHUNT     Social History:  reports that she quit smoking about 22 years ago. Her smoking use included Cigarettes. She started smoking about 58 years ago. She smoked 2.00 packs per day. She has never used smokeless tobacco. She reports that she does not drink alcohol or use drugs. Patient lives at [place]  . Codeine Other (See Comments)    Made teeth loose  . Penicillins Rash    Has patient had a PCN reaction causing immediate rash, facial/tongue/throat swelling, SOB or lightheadedness with hypotension: No Has patient had a PCN reaction causing severe rash involving mucus membranes or skin necrosis: Yes Has patient had a PCN reaction that required hospitalization No Has patient had a PCN reaction occurring within the last 10 years: No If all of the above answers are "NO", then may proceed with Cephalosporin use.   . Sulfa Antibiotics Rash    Family History  Problem Relation Age of Onset  . Cancer Father   . Hyperlipidemia Daughter   . Diabetes Brother   . Heart disease Brother       Prior to Admission medications   Medication Sig Start Date End Date Taking? Authorizing Provider  aspirin EC 81 MG tablet Take 81 mg by mouth daily.   Yes Historical Provider, MD  Choline Fenofibrate 135 MG capsule Take 135 mg by mouth daily.   Yes Historical Provider, MD  dorzolamide-timolol (COSOPT) 22.3-6.8 MG/ML ophthalmic solution Place 1 drop into both eyes 2 (two) times daily.  10/30/14  Yes Historical Provider, MD  gabapentin (NEURONTIN) 100 MG capsule Take 100 mg by mouth 3 (three) times daily.  09/29/14  Yes Historical Provider, MD  latanoprost (XALATAN) 0.005 % ophthalmic solution Place 1 drop into both eyes at bedtime.   Yes Historical Provider, MD  magnesium hydroxide (MILK OF MAGNESIA) 400 MG/5ML suspension Take 30 mLs by mouth every other day.  Yes Historical Provider, MD  Melatonin 5 MG TABS Take 5 mg by mouth at bedtime.   Yes Historical Provider, MD  Milnacipran (SAVELLA) 50 MG TABS Take 50 mg by mouth 2 (two) times daily.   Yes Historical Provider, MD  Multiple Vitamins-Minerals (MULTIVITAMIN WITH MINERALS) tablet Take 1 tablet by mouth every evening.    Yes Historical Provider, MD  nitroGLYCERIN (NITROSTAT) 0.4 MG SL tablet Place 1 tablet (0.4 mg total) under the tongue every  5 (five) minutes as needed for chest pain. 02/03/16  Yes Satira Sark, MD  pantoprazole (PROTONIX) 40 MG tablet Take 40 mg by mouth daily.  08/31/13  Yes Historical Provider, MD  pioglitazone (ACTOS) 15 MG tablet Take 15 mg by mouth daily.  10/03/14  Yes Historical Provider, MD  rosuvastatin (CRESTOR) 40 MG tablet Take 40 mg by mouth every evening.  08/20/16  Yes Historical Provider, MD  sitaGLIPtan-metformin (JANUMET) 50-1000 MG per tablet Take 1 tablet by mouth 2 (two) times daily with a meal.   Yes Historical Provider, MD    Physical Exam: BP 140/73   Pulse 108   Temp 97.5 F (36.4 C) (Oral)   Resp 24   Ht 5' 2.6" (1.59 m)   Wt 77.6 kg (171 lb)   SpO2 95%   BMI 30.68 kg/m   General: Elderly Caucasian female. Awake and alert and oriented x3. No acute cardiopulmonary distress.  HEENT: Normocephalic atraumatic.  Right and left ears normal in appearance.  Pupils equal, round, reactive to light. Extraocular muscles are intact. Sclerae anicteric and noninjected.  Moist mucosal membranes. No mucosal lesions.  Neck: Neck supple without lymphadenopathy. No carotid bruits. No masses palpated.  Cardiovascular: Regular rate with normal S1-S2 sounds. No murmurs, rubs, gallops auscultated. No JVD.  Respiratory: Diminished breath sounds. Rales in bases on right. Mild wheezing Abdomen: Soft, nontender, nondistended. Active bowel sounds. No masses or hepatosplenomegaly  Skin: No rashes, lesions, or ulcerations.  Dry, right lower extremity warm to touch, however left lower extremity cool. 2+ dorsalis pedis bilaterally and radial pulses. Musculoskeletal: No calf or leg pain. All major joints not erythematous nontender.  No upper or lower joint deformation.  Good ROM.  No contractures  Psychiatric: Intact judgment and insight. Pleasant and cooperative. Neurologic: No focal neurological deficits. Strength is 5/5 and symmetric in upper and lower extremities.  Cranial nerves II through XII are grossly  intact.           Labs on Admission: I have personally reviewed following labs and imaging studies  CBC:  Recent Labs Lab 10/26/16 1717  WBC 9.9  NEUTROABS 6.0  HGB 10.0*  HCT 36.6  MCV 80.6  PLT [id number]*   Basic Metabolic Panel:  Recent Labs Lab 10/26/16 1717  NA 134*  K 4.3  CL 97*  CO2 30  GLUCOSE 131*  BUN 18  CREATININE 0.64  CALCIUM 9.1   GFR: Estimated Creatinine Clearance: 54.8 mL/min (by C-G formula based on SCr of 0.64 mg/dL). Liver Function Tests: No results for input(s): AST, ALT, ALKPHOS, BILITOT, PROT, ALBUMIN in the last 168 hours. No results for input(s): LIPASE, AMYLASE in the last 168 hours. No results for input(s): AMMONIA in the last 168 hours. Coagulation Profile: No results for input(s): INR, PROTIME in the last 168 hours. Cardiac Enzymes: No results for input(s): CKTOTAL, CKMB, CKMBINDEX, TROPONINI in the last 168 hours. BNP (last 3 results) No results for input(s): PROBNP in the last 8760 hours. HbA1C: No results for input(s): HGBA1C in  the last 72 hours. CBG: No results for input(s): GLUCAP in the last 168 hours. Lipid Profile: No results for input(s): CHOL, HDL, LDLCALC, TRIG, CHOLHDL, LDLDIRECT in the last 72 hours. Thyroid Function Tests: No results for input(s): TSH, T4TOTAL, FREET4, T3FREE, THYROIDAB in the last 72 hours. Anemia Panel: No results for input(s): VITAMINB12, FOLATE, FERRITIN, TIBC, IRON, RETICCTPCT in the last 72 hours. Urine analysis: No results found for: COLORURINE, APPEARANCEUR, LABSPEC, PHURINE, GLUCOSEU, HGBUR, BILIRUBINUR, KETONESUR, PROTEINUR, UROBILINOGEN, NITRITE, LEUKOCYTESUR Sepsis Labs: @LABRCNTIP (procalcitonin:4,lacticidven:4) )No results found for this or any previous visit (from the past 240 hour(s)).   Radiological Exams on Admission: Dg Chest 2 View  Result Date: 10/26/2016 CLINICAL DATA:  Productive cough and shortness of breath over the last 2-3 weeks. Smoking history. Previous CABG. EXAM:  CHEST  2 VIEW COMPARISON:  04/28/2015 FINDINGS: Shunt tube overlies the right chest. Previous median sternotomy and CABG. Heart size is normal. Aortic atherosclerosis. The left lung is clear. There is infiltrate in volume loss in the right middle lobe consistent with pneumonia. No acute bone finding. IMPRESSION: Infiltrate and volume loss in the right middle lobe consistent with pneumonia. Electronically Signed   By: Nelson Chimes M.D.   On: 10/26/2016 15:19    Assessment/Plan: Principal Problem:   CAP (community acquired pneumonia) Active Problems:   Difficulty walking   Mixed hyperlipidemia   Type 2 diabetes mellitus ([place])    This patient was discussed with the ED physician, including pertinent vitals, physical exam findings, labs, and imaging.  We also discussed care given by the ED provider.  #1 community-acquired pneumonia Antibiotics: Azithromycin and Rocephin Robitussin Blood cultures drawn in the emergency department Sputum cultures CBC tomorrow Strep and Legionella antigen by urine #2 type 2 diabetes  Continue Actos and Janumet  CBGs before meals and daily at bedtime  Sliding scale insulin #3 difficult walking  Consult physical therapy #4 hyperlipidemia  Continue home medications  DVT prophylaxis: Lovenox Consultants: PT Code Status: Full code Family Communication: Daughters in the room  Disposition Plan: Patient likely to return home following admission   Truett Mainland, DO [facility] Pager [phone]  If 7PM-7AM, please contact night-coverage [email or web address] Password [email or web address]

## 2016-10-26 NOTE — ED Provider Notes (Signed)
[facility] DEPT Provider Note   CSN: [id number] Arrival date & time: 10/26/16  1535     History   Chief Complaint Chief Complaint  Patient presents with  . Pneumonia    HPI [name] is a 82 y.o. female.  Productive cough and dyspnea for 2 weeks, getting worse. She is hypoxemic on room air. She lives at home and her children look after her. She was seen by her primary care doctor (Dr Delphina Cahill) today who ordered a chest x-ray and pneumonia was noted. She was instructed to come to the emergency department for admission. She is dyspneic on exertion.      Past Medical History:  Diagnosis Date  . Coronary atherosclerosis of native coronary artery    Multivessel status post CABG  . Depression   . Glaucoma   . Hydrocephalus   . Hyperlipidemia   . Nephrolithiasis   . Spinal stenosis   . Type 2 diabetes mellitus [facility])     Patient Active Problem List   Diagnosis Date Noted  . Coronary atherosclerosis of native coronary artery 09/11/2013  . Mixed hyperlipidemia 09/11/2013  . Carotid artery occlusion without infarction 09/11/2013  . Type 2 diabetes mellitus ([place]) 09/11/2013  . Difficulty in walking(719.7) 06/24/2013    Past Surgical History:  Procedure Laterality Date  . APPENDECTOMY    . BACK SURGERY    . CORONARY ARTERY BYPASS GRAFT  11/24/1991   SVG to OM1, SVG to RCA  . FOOT SURGERY    . KYPHOPLASTY    . VENTRICULOPERITONEAL SHUNT      OB History    Gravida Para Term Preterm AB Living   3 3       3    SAB TAB Ectopic Multiple Live Births                   Home Medications    Prior to Admission medications   Medication Sig Start Date End Date Taking? Authorizing Provider  aspirin EC 81 MG tablet Take 81 mg by mouth daily.   Yes Historical Provider, MD  Choline Fenofibrate 135 MG capsule Take 135 mg by mouth daily.   Yes Historical Provider, MD  dorzolamide-timolol (COSOPT) 22.3-6.8 MG/ML ophthalmic solution Place 1 drop into both eyes 2 (two)  times daily.  10/30/14  Yes Historical Provider, MD  gabapentin (NEURONTIN) 100 MG capsule Take 100 mg by mouth 3 (three) times daily.  09/29/14  Yes Historical Provider, MD  latanoprost (XALATAN) 0.005 % ophthalmic solution Place 1 drop into both eyes at bedtime.   Yes Historical Provider, MD  magnesium hydroxide (MILK OF MAGNESIA) 400 MG/5ML suspension Take 30 mLs by mouth every other day.   Yes Historical Provider, MD  Melatonin 5 MG TABS Take 5 mg by mouth at bedtime.   Yes Historical Provider, MD  Milnacipran (SAVELLA) 50 MG TABS Take 50 mg by mouth 2 (two) times daily.   Yes Historical Provider, MD  Multiple Vitamins-Minerals (MULTIVITAMIN WITH MINERALS) tablet Take 1 tablet by mouth every evening.    Yes Historical Provider, MD  nitroGLYCERIN (NITROSTAT) 0.4 MG SL tablet Place 1 tablet (0.4 mg total) under the tongue every 5 (five) minutes as needed for chest pain. 02/03/16  Yes Satira Sark, MD  pantoprazole (PROTONIX) 40 MG tablet Take 40 mg by mouth daily.  08/31/13  Yes Historical Provider, MD  pioglitazone (ACTOS) 15 MG tablet Take 15 mg by mouth daily.  10/03/14  Yes Historical Provider, MD  rosuvastatin (CRESTOR)  40 MG tablet Take 40 mg by mouth every evening.  08/20/16  Yes Historical Provider, MD  sitaGLIPtan-metformin (JANUMET) 50-1000 MG per tablet Take 1 tablet by mouth 2 (two) times daily with a meal.   Yes Historical Provider, MD    Family History Family History  Problem Relation Age of Onset  . Cancer Father   . Hyperlipidemia Daughter   . Diabetes Brother   . Heart disease Brother     Social History Social History  Substance Use Topics  . Smoking status: Former Smoker    Packs/day: 2.00    Types: Cigarettes    Start date: 01/15/1958    Quit date: 12/17/1993  . Smokeless tobacco: Never Used  . Alcohol use No     Allergies   Codeine; Penicillins; and Sulfa antibiotics   Review of Systems Review of Systems  All other systems reviewed and are  negative.    Physical Exam Updated Vital Signs BP 147/96   Pulse 113   Temp 97.5 F (36.4 C) (Oral)   Resp (!) 29   Ht 5' 2.6" (1.59 m)   Wt 171 lb (77.6 kg)   SpO2 97%   BMI 30.68 kg/m   Physical Exam  Constitutional: She is oriented to person, place, and time.  Pleasant, cooperative, no obvious dyspnea with nasal oxygen  HENT:  Head: Normocephalic and atraumatic.  Eyes: Conjunctivae are normal.  Neck: Neck supple.  Cardiovascular: Normal rate and regular rhythm.   Pulmonary/Chest: Effort normal and breath sounds normal.  Abdominal: Soft. Bowel sounds are normal.  Musculoskeletal: Normal range of motion.  Neurological: She is alert and oriented to person, place, and time.  Skin: Skin is warm and dry.  Psychiatric: She has a normal mood and affect. Her behavior is normal.  Nursing note and vitals reviewed.    ED Treatments / Results  Labs (all labs ordered are listed, but only abnormal results are displayed) Labs Reviewed  CBC WITH DIFFERENTIAL/PLATELET - Abnormal; Notable for the following:       Result Value   Hemoglobin 10.0 (*)    MCH 22.0 (*)    MCHC 27.3 (*)    RDW 18.8 (*)    Platelets 441 (*)    All other components within normal limits  BASIC METABOLIC PANEL - Abnormal; Notable for the following:    Sodium 134 (*)    Chloride 97 (*)    Glucose, Bld 131 (*)    All other components within normal limits    EKG  EKG Interpretation None       Radiology Dg Chest 2 View  Result Date: 10/26/2016 CLINICAL DATA:  Productive cough and shortness of breath over the last 2-3 weeks. Smoking history. Previous CABG. EXAM: CHEST  2 VIEW COMPARISON:  04/28/2015 FINDINGS: Shunt tube overlies the right chest. Previous median sternotomy and CABG. Heart size is normal. Aortic atherosclerosis. The left lung is clear. There is infiltrate in volume loss in the right middle lobe consistent with pneumonia. No acute bone finding. IMPRESSION: Infiltrate and volume loss in  the right middle lobe consistent with pneumonia. Electronically Signed   By: Nelson Chimes M.D.   On: 10/26/2016 15:19    Procedures Procedures (including critical care time)  Medications Ordered in ED Medications  cefTRIAXone (ROCEPHIN) 1 g in dextrose 5 % 50 mL IVPB (1 g Intravenous New Bag/Given 10/26/16 1749)  azithromycin (ZITHROMAX) 500 mg in dextrose 5 % 250 mL IVPB (not administered)  sodium chloride 0.9 % bolus 1,000  mL (1,000 mLs Intravenous New Bag/Given 10/26/16 1750)  ipratropium-albuterol (DUONEB) 0.5-2.5 (3) MG/3ML nebulizer solution 3 mL (3 mLs Nebulization Given 10/26/16 1752)     Initial Impression / Assessment and Plan / ED Course  I have reviewed the triage vital signs and the nursing notes.  Pertinent labs & imaging results that were available during my care of the patient were reviewed by me and considered in my medical decision making (see chart for details).  Clinical Course     Patient is hemodynamically stable. Chest x-ray reveals right middle lobe pneumonia. IV Rocephin, IV Zithromax, DuoNeb nebulizer treatment. Admit.  Final Clinical Impressions(s) / ED Diagnoses   Final diagnoses:  Community acquired pneumonia of right middle lobe of lung ([place])    New Prescriptions New Prescriptions   No medications on file     Nat Christen, MD 10/26/16 1810

## 2016-10-26 NOTE — ED Triage Notes (Signed)
Pt sent by Dr. Juel Burrow office for pneumonia and low O2 sat. Pt has had CXR. Dr. Lacinda Axon was notified of pt before arrival.

## 2016-10-27 ENCOUNTER — Inpatient Hospital Stay (HOSPITAL_COMMUNITY): Payer: Medicare Other

## 2016-10-27 DIAGNOSIS — E119 Type 2 diabetes mellitus without complications: Secondary | ICD-10-CM

## 2016-10-27 DIAGNOSIS — I10 Essential (primary) hypertension: Secondary | ICD-10-CM

## 2016-10-27 DIAGNOSIS — R531 Weakness: Secondary | ICD-10-CM

## 2016-10-27 LAB — URINALYSIS, ROUTINE W REFLEX MICROSCOPIC
BILIRUBIN URINE: NEGATIVE
Glucose, UA: NEGATIVE mg/dL
Hgb urine dipstick: NEGATIVE
Ketones, ur: NEGATIVE mg/dL
Leukocytes, UA: NEGATIVE
NITRITE: NEGATIVE
PROTEIN: NEGATIVE mg/dL
SPECIFIC GRAVITY, URINE: 1.02 (ref 1.005–1.030)
pH: 6 (ref 5.0–8.0)

## 2016-10-27 LAB — BLOOD GAS, ARTERIAL
ACID-BASE EXCESS: 6.8 mmol/L — AB (ref 0.0–2.0)
Bicarbonate: 29 mmol/L — ABNORMAL HIGH (ref 20.0–28.0)
DRAWN BY: 277331
O2 Content: 4 L/min
O2 Saturation: 94.6 %
PATIENT TEMPERATURE: 37
PH ART: 7.224 — AB (ref 7.350–7.450)
pCO2 arterial: 85.7 mmHg (ref 32.0–48.0)
pO2, Arterial: 88.1 mmHg (ref 83.0–108.0)

## 2016-10-27 LAB — CBC
HEMATOCRIT: 36.2 % (ref 36.0–46.0)
Hemoglobin: 9.6 g/dL — ABNORMAL LOW (ref 12.0–15.0)
MCH: 21.8 pg — AB (ref 26.0–34.0)
MCHC: 26.5 g/dL — AB (ref 30.0–36.0)
MCV: 82.1 fL (ref 78.0–100.0)
Platelets: 408 10*3/uL — ABNORMAL HIGH (ref 150–400)
RBC: 4.41 MIL/uL (ref 3.87–5.11)
RDW: 19 % — AB (ref 11.5–15.5)
WBC: 9.2 10*3/uL (ref 4.0–10.5)

## 2016-10-27 LAB — EXPECTORATED SPUTUM ASSESSMENT W REFEX TO RESP CULTURE

## 2016-10-27 LAB — EXPECTORATED SPUTUM ASSESSMENT W GRAM STAIN, RFLX TO RESP C

## 2016-10-27 LAB — STREP PNEUMONIAE URINARY ANTIGEN: Strep Pneumo Urinary Antigen: NEGATIVE

## 2016-10-27 MED ORDER — LATANOPROST 0.005 % OP SOLN
OPHTHALMIC | Status: AC
  Filled 2016-10-27: qty 2.5

## 2016-10-27 MED ORDER — SENNA 8.6 MG PO TABS
1.0000 | ORAL_TABLET | Freq: Every day | ORAL | Status: DC
  Administered 2016-10-27 – 2016-11-03 (×8): 8.6 mg via ORAL
  Filled 2016-10-27 (×8): qty 1

## 2016-10-27 MED ORDER — ALBUTEROL SULFATE (2.5 MG/3ML) 0.083% IN NEBU
2.5000 mg | INHALATION_SOLUTION | Freq: Three times a day (TID) | RESPIRATORY_TRACT | Status: DC
  Administered 2016-10-27 – 2016-10-28 (×5): 2.5 mg via RESPIRATORY_TRACT
  Filled 2016-10-27 (×5): qty 3

## 2016-10-27 MED ORDER — FUROSEMIDE 10 MG/ML IJ SOLN
40.0000 mg | Freq: Once | INTRAMUSCULAR | Status: AC
  Administered 2016-10-27: 40 mg via INTRAVENOUS
  Filled 2016-10-27: qty 4

## 2016-10-27 MED ORDER — ALBUTEROL SULFATE (2.5 MG/3ML) 0.083% IN NEBU
2.5000 mg | INHALATION_SOLUTION | Freq: Four times a day (QID) | RESPIRATORY_TRACT | Status: DC
  Administered 2016-10-27: 2.5 mg via RESPIRATORY_TRACT
  Filled 2016-10-27: qty 3

## 2016-10-27 MED ORDER — POLYETHYLENE GLYCOL 3350 17 G PO PACK
17.0000 g | PACK | Freq: Every day | ORAL | Status: DC
  Administered 2016-10-27 – 2016-11-04 (×8): 17 g via ORAL
  Filled 2016-10-27 (×9): qty 1

## 2016-10-27 MED ORDER — DORZOLAMIDE HCL-TIMOLOL MAL 2-0.5 % OP SOLN
OPHTHALMIC | Status: AC
  Filled 2016-10-27: qty 10

## 2016-10-27 MED ORDER — KETOROLAC TROMETHAMINE 10 MG PO TABS
10.0000 mg | ORAL_TABLET | Freq: Three times a day (TID) | ORAL | Status: AC | PRN
  Administered 2016-10-27 – 2016-10-29 (×4): 10 mg via ORAL
  Filled 2016-10-27 (×7): qty 1

## 2016-10-27 NOTE — Progress Notes (Addendum)
PROGRESS NOTE [facility]   [name]   [id number] DOB: 1933-06-12  DOA: 10/26/2016 PCP: Wende Neighbors, MD   Brief Narrative:  [name] is a 82 y.o. female with a history of hyperlipidemia, coronary artery disease status post multivessel CABG, depression, hypertension, diabetes type 2, presented to the ED after being seen by her primary care physician who obtained a checks x-ray and diagnosed with pneumonia. Patient was admitted for IV antibiotics due to saturations to the mid 80s.   Subjective: Patient seen and examined with daughter site. Patient report having night sweats and chills overnight, which has been going on for about 2 weeks. Also has been coughing with productive sputum. She denied weight loss. No recent travel. Shortness of breath has improved. Tolerate well trial of room air although saturating in the low 90s.   Assessment & Plan: Community-acquired pneumonia -Continue IV antibiotics, if patient continues to improve clinically, consider switching to by mouth antibiotics and discharged home tomorrow in the morning -Follow-up blood culture -Sputum cultures negative -Legionella and strep pending -Check for flu -Supportive treatment antitussive and Tylenol -Wean O2 as tolerated -Nebulizer treatment added  Type 2 diabetes - stable -Continue home medications -Monitor CBGs -SSI  Generalized weakness - likely 2/2 to infectious process and deconditioning from poor mobility  -PT eval   HTN - stable  -Resume home medications  -Monitor BP   Constipation -MiraLAX -Senokot   Addendum 6:23PM  Called by nurse, patient with increase WOB, clammy and feeling that she is choking. Patient diaphoretic which is chronic - for years  Denies chest pain and palpitation.  Lungs Decrease air entry, b/l crackles at the bases  Extr: 1+ pitting edema.  EKG ordered - Sinus tachy no ST changes noted   Increase SOB - will get an ABG, CXR, and 40mg  Lasix  ABG  show Co2 retention 85.2 - start on BiPAP, transfer to Stepdown   DVT prophylaxis: Lovenox Code Status: Full Family Communication: Daughters at bedside Disposition Plan: Clinically improving can be DC in the morning.  Consultants:   None  Procedures:   None  Antimicrobials:  Azithromycin 11/10   Rocephin 11/10   Objective: Vitals:   10/26/16 2006 10/26/16 2045 10/27/16 0504 10/27/16 1039  BP:  127/84 (!) 116/41   Pulse:  100 (!) 101   Resp:  20 20   Temp:  98.2 F (36.8 C) 98.5 F (36.9 C)   TempSrc:  Oral Oral   SpO2: 92% 92% 92% (S) (!) 85%  Weight:  79.3 kg (174 lb 14.4 oz)    Height:  5\' 2"  (1.575 m)      Intake/Output Summary (Last 24 hours) at 10/27/16 1256 Last data filed at 10/27/16 1000  Gross per 24 hour  Intake              370 ml  Output             1025 ml  Net             -655 ml   Filed Weights   10/26/16 1611 10/26/16 2045  Weight: 77.6 kg (171 lb) 79.3 kg (174 lb 14.4 oz)    Examination:  General exam: Appears calm and comfortable  Respiratory system: Good air entry, wheezing on the right middle and lower lobe. Fine crackles in the right middle lobe. Cardiovascular system: S1 & S2 heard, RRR. No JVD, murmurs, rubs or gallops Gastrointestinal system: Soft, slightly distended, non-tenderness. Bowel sounds present Central nervous system:  Alert and oriented. No focal neurological deficits. Extremities: No pedal edema. Skin: No rashes, lesions or ulcers Psychiatry: Judgement and insight appear normal. Mood & affect appropriate.    Data Reviewed: I have personally reviewed following labs and imaging studies  CBC:  Recent Labs Lab 10/26/16 1717 10/27/16 0706  WBC 9.9 9.2  NEUTROABS 6.0  --   HGB 10.0* 9.6*  HCT 36.6 36.2  MCV 80.6 82.1  PLT 441* [id number]*   Basic Metabolic Panel:  Recent Labs Lab 10/26/16 1717  NA 134*  K 4.3  CL 97*  CO2 30  GLUCOSE 131*  BUN 18  CREATININE 0.64  CALCIUM 9.1   GFR: Estimated Creatinine  Clearance: 54.7 mL/min (by C-G formula based on SCr of 0.64 mg/dL). Liver Function Tests: No results for input(s): AST, ALT, ALKPHOS, BILITOT, PROT, ALBUMIN in the last 168 hours. No results for input(s): LIPASE, AMYLASE in the last 168 hours. No results for input(s): AMMONIA in the last 168 hours. Coagulation Profile: No results for input(s): INR, PROTIME in the last 168 hours. Cardiac Enzymes: No results for input(s): CKTOTAL, CKMB, CKMBINDEX, TROPONINI in the last 168 hours. BNP (last 3 results) No results for input(s): PROBNP in the last 8760 hours. HbA1C: No results for input(s): HGBA1C in the last 72 hours. CBG:  Recent Labs Lab 10/26/16 2039  GLUCAP 156*   Lipid Profile: No results for input(s): CHOL, HDL, LDLCALC, TRIG, CHOLHDL, LDLDIRECT in the last 72 hours. Thyroid Function Tests: No results for input(s): TSH, T4TOTAL, FREET4, T3FREE, THYROIDAB in the last 72 hours. Anemia Panel: No results for input(s): VITAMINB12, FOLATE, FERRITIN, TIBC, IRON, RETICCTPCT in the last 72 hours. Sepsis Labs: No results for input(s): PROCALCITON, LATICACIDVEN in the last 168 hours.  Recent Results (from the past 240 hour(s))  Blood culture (routine x 2)     Status: None (Preliminary result)   Collection Time: 10/26/16  6:43 PM  Result Value Ref Range Status   Specimen Description BLOOD  Final   Special Requests NONE  Final   Culture NO GROWTH < 12 HOURS  Final   Report Status PENDING  Incomplete  Blood culture (routine x 2)     Status: None (Preliminary result)   Collection Time: 10/26/16  6:50 PM  Result Value Ref Range Status   Specimen Description BLOOD  Final   Special Requests NONE  Final   Culture NO GROWTH < 12 HOURS  Final   Report Status PENDING  Incomplete  Culture, sputum-assessment     Status: None   Collection Time: 10/27/16  6:05 AM  Result Value Ref Range Status   Specimen Description SPUTUM  Final   Special Requests NONE  Final   Sputum evaluation   Final     MICROSCOPIC FINDINGS SUGGEST THAT THIS SPECIMEN IS NOT REPRESENTATIVE OF LOWER RESPIRATORY SECRETIONS. PLEASE RECOLLECT. NOTIFIED R.[place] AT 1040A ON [id number] BY THOMPSON S.    Report Status 10/27/2016 FINAL  Final       Radiology Studies: Dg Chest 2 View  Result Date: 10/26/2016 CLINICAL DATA:  Productive cough and shortness of breath over the last 2-3 weeks. Smoking history. Previous CABG. EXAM: CHEST  2 VIEW COMPARISON:  04/28/2015 FINDINGS: Shunt tube overlies the right chest. Previous median sternotomy and CABG. Heart size is normal. Aortic atherosclerosis. The left lung is clear. There is infiltrate in volume loss in the right middle lobe consistent with pneumonia. No acute bone finding. IMPRESSION: Infiltrate and volume loss in the right middle lobe consistent with  pneumonia. Electronically Signed   By: Nelson Chimes M.D.   On: 10/26/2016 15:19      Scheduled Meds: . albuterol  2.5 mg Nebulization TID  . aspirin EC  81 mg Oral Daily  . azithromycin  500 mg Intravenous Q24H  . cefTRIAXone (ROCEPHIN)  IV  1 g Intravenous Q24H  . dorzolamide-timolol  1 drop Both Eyes BID  . enoxaparin (LOVENOX) injection  40 mg Subcutaneous Q24H  . fenofibrate  160 mg Oral Daily  . gabapentin  100 mg Oral TID  . insulin aspart  0-15 Units Subcutaneous TID WC  . latanoprost  1 drop Both Eyes QHS  . metFORMIN  500 mg Oral BID WC   And  . linagliptin  5 mg Oral Q breakfast  . Milnacipran  50 mg Oral BID  . pantoprazole  40 mg Oral Daily  . pioglitazone  15 mg Oral Daily  . polyethylene glycol  17 g Oral Daily  . rosuvastatin  40 mg Oral QPM  . senna  1 tablet Oral QHS   Continuous Infusions:   LOS: 1 day    Chipper Oman, MD [facility] Pager [phone]  If 7PM-7AM, please contact night-coverage [email or web address] Password [email or web address] 10/27/2016, 12:56 PM

## 2016-10-27 NOTE — Progress Notes (Signed)
In room to round on patient, patient short of breath, increased work of breathing, clammy, diaphoretic, states she has a "choking" sensation, although she is alert, and can speak in full sentences. Daughter reports "hot flashes with sweating for years before the hysterectomy." CBG 150. Dr Quincy Simmonds paged to bedside.

## 2016-10-27 NOTE — Progress Notes (Signed)
CRITICAL VALUE ALERT  Critical value received:  ABG  PH 7.224 CO2 85.7 PO2 88 Bicarb 29 Base 68 O2 94%  Date of notification:  10/27/16  Time of notification:  1841  Critical value read back YES  Nurse who received alert:  Nile Prisk  MD notified (1st page):  SILVA  Time of first page:  1841  MD notified (2nd page):  Time of second page:  Responding MD:  Quincy Simmonds  Time MD responded:  [phone]

## 2016-10-28 ENCOUNTER — Inpatient Hospital Stay (HOSPITAL_COMMUNITY): Payer: Medicare Other

## 2016-10-28 DIAGNOSIS — R0600 Dyspnea, unspecified: Secondary | ICD-10-CM

## 2016-10-28 DIAGNOSIS — I517 Cardiomegaly: Secondary | ICD-10-CM

## 2016-10-28 LAB — CBC
HEMATOCRIT: 34.2 % — AB (ref 36.0–46.0)
HEMOGLOBIN: 9.1 g/dL — AB (ref 12.0–15.0)
MCH: 22 pg — ABNORMAL LOW (ref 26.0–34.0)
MCHC: 26.6 g/dL — ABNORMAL LOW (ref 30.0–36.0)
MCV: 82.6 fL (ref 78.0–100.0)
Platelets: 332 10*3/uL (ref 150–400)
RBC: 4.14 MIL/uL (ref 3.87–5.11)
RDW: 19.1 % — ABNORMAL HIGH (ref 11.5–15.5)
WBC: 10 10*3/uL (ref 4.0–10.5)

## 2016-10-28 LAB — BLOOD GAS, ARTERIAL
ACID-BASE EXCESS: 9.7 mmol/L — AB (ref 0.0–2.0)
BICARBONATE: 31.7 mmol/L — AB (ref 20.0–28.0)
O2 Content: 4 L/min
O2 Saturation: 92.1 %
PCO2 ART: 96.2 mmHg — AB (ref 32.0–48.0)
PH ART: 7.214 — AB (ref 7.350–7.450)
pO2, Arterial: 75.5 mmHg — ABNORMAL LOW (ref 83.0–108.0)

## 2016-10-28 LAB — GLUCOSE, CAPILLARY
GLUCOSE-CAPILLARY: 152 mg/dL — AB (ref 65–99)
GLUCOSE-CAPILLARY: 219 mg/dL — AB (ref 65–99)
Glucose-Capillary: 152 mg/dL — ABNORMAL HIGH (ref 65–99)
Glucose-Capillary: 148 mg/dL — ABNORMAL HIGH (ref 65–99)

## 2016-10-28 LAB — MRSA PCR SCREENING: MRSA BY PCR: NEGATIVE

## 2016-10-28 LAB — PROCALCITONIN

## 2016-10-28 LAB — HIV ANTIBODY (ROUTINE TESTING W REFLEX): HIV Screen 4th Generation wRfx: NONREACTIVE

## 2016-10-28 LAB — ECHOCARDIOGRAM COMPLETE
Height: 63.5 in
WEIGHTICAEL: 2751.34 [oz_av]

## 2016-10-28 MED ORDER — PNEUMOCOCCAL VAC POLYVALENT 25 MCG/0.5ML IJ INJ
0.5000 mL | INJECTION | INTRAMUSCULAR | Status: AC
Start: 2016-10-29 — End: 2016-10-29
  Administered 2016-10-29: 0.5 mL via INTRAMUSCULAR
  Filled 2016-10-28: qty 0.5

## 2016-10-28 MED ORDER — FUROSEMIDE 10 MG/ML IJ SOLN
40.0000 mg | Freq: Once | INTRAMUSCULAR | Status: AC
  Administered 2016-10-28: 40 mg via INTRAVENOUS
  Filled 2016-10-28: qty 4

## 2016-10-28 MED ORDER — CHLORHEXIDINE GLUCONATE 0.12 % MT SOLN
15.0000 mL | Freq: Two times a day (BID) | OROMUCOSAL | Status: DC
Start: 2016-10-28 — End: 2016-11-04
  Administered 2016-10-28 – 2016-11-04 (×15): 15 mL via OROMUCOSAL
  Filled 2016-10-28 (×15): qty 15

## 2016-10-28 MED ORDER — METHYLPREDNISOLONE SODIUM SUCC 125 MG IJ SOLR
125.0000 mg | Freq: Once | INTRAMUSCULAR | Status: AC
  Administered 2016-10-28: 125 mg via INTRAVENOUS
  Filled 2016-10-28: qty 2

## 2016-10-28 MED ORDER — FLEET ENEMA 7-19 GM/118ML RE ENEM
1.0000 | ENEMA | Freq: Once | RECTAL | Status: AC
Start: 2016-10-28 — End: 2016-10-28
  Administered 2016-10-28: 1 via RECTAL

## 2016-10-28 MED ORDER — ORAL CARE MOUTH RINSE
15.0000 mL | Freq: Two times a day (BID) | OROMUCOSAL | Status: DC
  Administered 2016-10-28 – 2016-11-02 (×10): 15 mL via OROMUCOSAL

## 2016-10-28 NOTE — Progress Notes (Addendum)
PROGRESS NOTE [facility]   [name]   [id number] DOB: 31-Dec-1932  DOA: 10/26/2016 PCP: Wende Neighbors, MD   Brief Narrative:  [name] is a 82 y.o. female with a history of hyperlipidemia, coronary artery disease status post multivessel CABG, depression, hypertension, diabetes type 2, presented to the ED after being seen by her primary care physician who obtained a checks x-ray and diagnosed with pneumonia. Patient was admitted for IV antibiotics due to saturations to the mid 80s. On 11/11 went on respiratory distress with hypercapnia, Lasix given, placed on BiPAP and sent to SDU.   Subjective: Patient seen and examined with daughter site. After respiratory distress, patient was placed on BiPAP which tolerated well all night, currently on it. Patient report significant improvement, feeling much better.   Assessment & Plan: Acute respiratory failure with hypercapnia 2/2 Community-acquired pneumonia, patient responded well to lasix and BiPAP wonder if there is a CHF component as well, given hx of CABG, cardiomegaly on CXR and signs of fluid retention. -Get an ECHO -Can d/c BiPAP  -Continue IV antibiotics for now -Blood cx no growth up date -Sputum cultures negative -Strep negative  -Legionella pending  -Influenza pending  -Supportive treatment antitussive and Tylenol -Wean O2 as tolerated -Nebulizer treatment added  Type 2 diabetes - stable  -Holding home meds - patient on pioglitazone can cause fluid retention.  -Monitor CBGs -SSI  Generalized weakness - likely 2/2 to infectious process and deconditioning from poor mobility  -PT eval   HTN - stable  -Resume home medications  -Monitor BP   Constipation - no BM's passing gas, -MiraLAX -Senokot -If no BM in the PM consider fleet enema   DVT prophylaxis: Lovenox Code Status: Full Family Communication: Daughters at bedside Disposition Plan: When medically stable anticipate d/c home   Consultants:    None  Procedures:   None  Antimicrobials:  Azithromycin 11/10   Rocephin 11/10   Objective: Vitals:   10/28/16 0500 10/28/16 0600 10/28/16 0731 10/28/16 0735  BP: (!) 100/49 (!) 99/53    Pulse: 93 94    Resp: (!) 25 (!) 25    Temp:   99.4 F (37.4 C)   TempSrc:   Axillary   SpO2: 98% 99%  99%  Weight: 78 kg (171 lb 15.3 oz)     Height:        Intake/Output Summary (Last 24 hours) at 10/28/16 0839 Last data filed at 10/27/16 2000  Gross per 24 hour  Intake              650 ml  Output             1600 ml  Net             -950 ml   Filed Weights   10/26/16 2045 10/27/16 2015 10/28/16 0500  Weight: 79.3 kg (174 lb 14.4 oz) 77.4 kg (170 lb 10.2 oz) 78 kg (171 lb 15.3 oz)    Examination:  General exam: On BiPAP, comfortable  Respiratory system: Good air entry, fine crackle - improved from yesterday  Cardiovascular system: S1 & S2 heard, RRR.  Gastrointestinal system: Soft, distended, non-tenderness. Bowel sounds present Central nervous system: Alert and oriented. No focal neurological deficits. Extremities: trace pedal edema.  Data Reviewed: I have personally reviewed following labs and imaging studies  CBC:  Recent Labs Lab 10/26/16 1717 10/27/16 0706 10/28/16 0516  WBC 9.9 9.2 10.0  NEUTROABS 6.0  --   --   HGB  10.0* 9.6* 9.1*  HCT 36.6 36.2 34.2*  MCV 80.6 82.1 82.6  PLT 441* 408* [id number]   Basic Metabolic Panel:  Recent Labs Lab 10/26/16 1717  NA 134*  K 4.3  CL 97*  CO2 30  GLUCOSE 131*  BUN 18  CREATININE 0.64  CALCIUM 9.1   GFR: Estimated Creatinine Clearance: 56.1 mL/min (by C-G formula based on SCr of 0.64 mg/dL). Liver Function Tests: No results for input(s): AST, ALT, ALKPHOS, BILITOT, PROT, ALBUMIN in the last 168 hours. No results for input(s): LIPASE, AMYLASE in the last 168 hours. No results for input(s): AMMONIA in the last 168 hours. Coagulation Profile: No results for input(s): INR, PROTIME in the last 168  hours. Cardiac Enzymes: No results for input(s): CKTOTAL, CKMB, CKMBINDEX, TROPONINI in the last 168 hours. BNP (last 3 results) No results for input(s): PROBNP in the last 8760 hours. HbA1C: No results for input(s): HGBA1C in the last 72 hours. CBG:  Recent Labs Lab 10/26/16 2039 10/28/16 0736  GLUCAP 156* 152*   Lipid Profile: No results for input(s): CHOL, HDL, LDLCALC, TRIG, CHOLHDL, LDLDIRECT in the last 72 hours. Thyroid Function Tests: No results for input(s): TSH, T4TOTAL, FREET4, T3FREE, THYROIDAB in the last 72 hours. Anemia Panel: No results for input(s): VITAMINB12, FOLATE, FERRITIN, TIBC, IRON, RETICCTPCT in the last 72 hours. Sepsis Labs:  Recent Labs Lab 10/28/16 0516  PROCALCITON <0.10    Recent Results (from the past 240 hour(s))  Blood culture (routine x 2)     Status: None (Preliminary result)   Collection Time: 10/26/16  6:43 PM  Result Value Ref Range Status   Specimen Description BLOOD LEFT ARM  Final   Special Requests BOTTLES DRAWN AEROBIC AND ANAEROBIC [place]  Final   Culture NO GROWTH 2 DAYS  Final   Report Status PENDING  Incomplete  Blood culture (routine x 2)     Status: None (Preliminary result)   Collection Time: 10/26/16  6:50 PM  Result Value Ref Range Status   Specimen Description BLOOD LEFT HAND  Final   Special Requests BOTTLES DRAWN AEROBIC AND ANAEROBIC [place]  Final   Culture NO GROWTH 2 DAYS  Final   Report Status PENDING  Incomplete  Culture, sputum-assessment     Status: None   Collection Time: 10/27/16  6:05 AM  Result Value Ref Range Status   Specimen Description SPUTUM  Final   Special Requests NONE  Final   Sputum evaluation   Final    MICROSCOPIC FINDINGS SUGGEST THAT THIS SPECIMEN IS NOT REPRESENTATIVE OF LOWER RESPIRATORY SECRETIONS. PLEASE RECOLLECT. NOTIFIED R.CHAPELLE AT 1040A ON [id number] BY THOMPSON S.    Report Status 10/27/2016 FINAL  Final  MRSA PCR Screening     Status: None   Collection Time: 10/27/16   9:48 PM  Result Value Ref Range Status   MRSA by PCR NEGATIVE NEGATIVE Final    Comment:        The GeneXpert MRSA Assay (FDA approved for NASAL specimens only), is one component of a comprehensive MRSA colonization surveillance program. It is not intended to diagnose MRSA infection nor to guide or monitor treatment for MRSA infections.        Radiology Studies: Dg Chest 2 View  Result Date: 10/26/2016 CLINICAL DATA:  Productive cough and shortness of breath over the last 2-3 weeks. Smoking history. Previous CABG. EXAM: CHEST  2 VIEW COMPARISON:  04/28/2015 FINDINGS: Shunt tube overlies the right chest. Previous median sternotomy and CABG. Heart size  is normal. Aortic atherosclerosis. The left lung is clear. There is infiltrate in volume loss in the right middle lobe consistent with pneumonia. No acute bone finding. IMPRESSION: Infiltrate and volume loss in the right middle lobe consistent with pneumonia. Electronically Signed   By: Nelson Chimes M.D.   On: 10/26/2016 15:19   Dg Chest Port 1 View  Result Date: 10/27/2016 CLINICAL DATA:  Shortness of breath worsening today. EXAM: PORTABLE CHEST 1 VIEW COMPARISON:  10/26/2016 FINDINGS: The cardio pericardial silhouette is enlarged. Interstitial markings are diffusely coarsened with chronic features. There is pulmonary vascular congestion without overt pulmonary edema. Airspace disease and volume loss seen previously in the right middle lobe has improved in the interval Ventriculostomy shunt tubing overlies the right para midline thorax. Patient is status post CABG. The visualized bony structures of the thorax are intact. IMPRESSION: Interval improvement in aeration right middle lobe. Cardiomegaly with emphysema and chronic interstitial coarsening. Electronically Signed   By: Misty Stanley M.D.   On: 10/27/2016 18:37      Scheduled Meds: . albuterol  2.5 mg Nebulization TID  . aspirin EC  81 mg Oral Daily  . azithromycin  500 mg  Intravenous Q24H  . cefTRIAXone (ROCEPHIN)  IV  1 g Intravenous Q24H  . chlorhexidine  15 mL Mouth Rinse BID  . dorzolamide-timolol  1 drop Both Eyes BID  . enoxaparin (LOVENOX) injection  40 mg Subcutaneous Q24H  . fenofibrate  160 mg Oral Daily  . gabapentin  100 mg Oral TID  . insulin aspart  0-15 Units Subcutaneous TID WC  . latanoprost  1 drop Both Eyes QHS  . mouth rinse  15 mL Mouth Rinse q12n4p  . Milnacipran  50 mg Oral BID  . pantoprazole  40 mg Oral Daily  . [START ON 10/29/2016] pneumococcal 23 valent vaccine  0.5 mL Intramuscular Tomorrow-1000  . polyethylene glycol  17 g Oral Daily  . rosuvastatin  40 mg Oral QPM  . senna  1 tablet Oral QHS   Continuous Infusions:   LOS: 2 days    Chipper Oman, MD [facility] Pager [phone]  If 7PM-7AM, please contact night-coverage [email or web address] Password [facility] 10/28/2016, 8:39 AM

## 2016-10-28 NOTE — Progress Notes (Signed)
*  PRELIMINARY RESULTS* Echocardiogram 2D Echocardiogram has been performed.  [name] 10/28/2016, 11:43 AM

## 2016-10-29 DIAGNOSIS — J9602 Acute respiratory failure with hypercapnia: Secondary | ICD-10-CM | POA: Diagnosis not present

## 2016-10-29 DIAGNOSIS — D649 Anemia, unspecified: Secondary | ICD-10-CM

## 2016-10-29 DIAGNOSIS — I5031 Acute diastolic (congestive) heart failure: Secondary | ICD-10-CM | POA: Diagnosis not present

## 2016-10-29 DIAGNOSIS — I10 Essential (primary) hypertension: Secondary | ICD-10-CM | POA: Insufficient documentation

## 2016-10-29 DIAGNOSIS — E1140 Type 2 diabetes mellitus with diabetic neuropathy, unspecified: Secondary | ICD-10-CM | POA: Diagnosis present

## 2016-10-29 DIAGNOSIS — J441 Chronic obstructive pulmonary disease with (acute) exacerbation: Secondary | ICD-10-CM | POA: Diagnosis present

## 2016-10-29 LAB — BLOOD GAS, ARTERIAL
Acid-Base Excess: 13.2 mmol/L — ABNORMAL HIGH (ref 0.0–2.0)
Bicarbonate: 35.6 mmol/L — ABNORMAL HIGH (ref 20.0–28.0)
Drawn by: 270161
O2 CONTENT: 3.5 L/min
O2 Saturation: 93.2 %
PCO2 ART: 81.2 mmHg — AB (ref 32.0–48.0)
PH ART: 7.311 — AB (ref 7.350–7.450)
PO2 ART: 76.2 mmHg — AB (ref 83.0–108.0)
Patient temperature: 37

## 2016-10-29 LAB — LEGIONELLA PNEUMOPHILA SEROGP 1 UR AG: L. PNEUMOPHILA SEROGP 1 UR AG: NEGATIVE

## 2016-10-29 LAB — GLUCOSE, CAPILLARY
GLUCOSE-CAPILLARY: 159 mg/dL — AB (ref 65–99)
GLUCOSE-CAPILLARY: 130 mg/dL — AB (ref 65–99)
GLUCOSE-CAPILLARY: 221 mg/dL — AB (ref 65–99)
GLUCOSE-CAPILLARY: 237 mg/dL — AB (ref 65–99)
Glucose-Capillary: 155 mg/dL — ABNORMAL HIGH (ref 65–99)
Glucose-Capillary: 150 mg/dL — ABNORMAL HIGH (ref 65–99)
Glucose-Capillary: 144 mg/dL — ABNORMAL HIGH (ref 65–99)

## 2016-10-29 LAB — INFLUENZA PANEL BY PCR (TYPE A & B)
INFLBPCR: NEGATIVE
Influenza A By PCR: NEGATIVE

## 2016-10-29 LAB — PROCALCITONIN: Procalcitonin: <0.1 ng/mL

## 2016-10-29 MED ORDER — METFORMIN HCL 500 MG PO TABS
1000.0000 mg | ORAL_TABLET | Freq: Two times a day (BID) | ORAL | Status: DC
  Administered 2016-10-29 – 2016-11-04 (×13): 1000 mg via ORAL
  Filled 2016-10-29 (×14): qty 2

## 2016-10-29 MED ORDER — METHYLPREDNISOLONE SODIUM SUCC 40 MG IJ SOLR
40.0000 mg | Freq: Two times a day (BID) | INTRAMUSCULAR | Status: DC
  Administered 2016-10-29 – 2016-11-02 (×9): 40 mg via INTRAVENOUS
  Filled 2016-10-29 (×9): qty 1

## 2016-10-29 MED ORDER — ALBUTEROL SULFATE (2.5 MG/3ML) 0.083% IN NEBU
2.5000 mg | INHALATION_SOLUTION | RESPIRATORY_TRACT | Status: DC
  Administered 2016-10-29 – 2016-10-30 (×10): 2.5 mg via RESPIRATORY_TRACT
  Filled 2016-10-29 (×10): qty 3

## 2016-10-29 MED ORDER — POTASSIUM CHLORIDE CRYS ER 20 MEQ PO TBCR
20.0000 meq | EXTENDED_RELEASE_TABLET | Freq: Every day | ORAL | Status: AC
  Administered 2016-10-29 – 2016-10-30 (×2): 20 meq via ORAL
  Filled 2016-10-29: qty 1

## 2016-10-29 MED ORDER — POTASSIUM CHLORIDE CRYS ER 20 MEQ PO TBCR
20.0000 meq | EXTENDED_RELEASE_TABLET | Freq: Two times a day (BID) | ORAL | Status: DC
  Filled 2016-10-29: qty 1

## 2016-10-29 MED ORDER — FUROSEMIDE 20 MG PO TABS: 20.0000 mg | ORAL_TABLET | Freq: Every day | ORAL | Status: DC

## 2016-10-29 NOTE — Progress Notes (Signed)
PROGRESS NOTE    [name]  [id number] DOB: 28-Feb-1933 DOA: 10/26/2016 PCP: Wende Neighbors, MD   Brief Narrative:  [name] is a 82 y.o. female with a history of hyperlipidemia, coronary artery disease status post multivessel CABG, depression, HTN, DM-2 who presented to the ED on 10/26/16 with a chief complaint of productive cough with green sputum and shortness of breath. Prior to admission, she had an outpatient chest x-ray ordered by her PCP which revealed right middle lobe pneumonia. She was advised to come to the ED. She was admitted for further evaluation and management. On 10/27/16, the patient developed respiratory distress. Her ABG revealed respiratory acidosis and hypercapnia. She was empirically given Lasix and placed on BiPAP and transferred to SDU. Echo revealed grade 2 diastolic dysfunction and an EF of 60%. Pulmonology consulted.    Assessment & Plan:   Principal Problem:   CAP (community acquired pneumonia) Active Problems:   Acute respiratory failure with hypercapnia (HCC)   Acute diastolic heart failure (HCC)   COPD with exacerbation (HCC)   Difficulty walking   Coronary atherosclerosis of native coronary artery   Normocytic anemia   Diabetes mellitus with neuropathy ([place])    1. Community-acquired pneumonia. Patient was afebrile and her white blood cell count was within normal limits on admission. Her chest x-ray revealed RML pneumonia. She was started on Rocephin and azithromycin. She remains afebrile. Her HIV was nonreactive. Strep pneumo antigen was negative. Blood cultures negative to date. -Influenza, sputum culture, and Legionella antigens are pending.  Acute respiratory failure with hypercapnia. On 11/11, the patient developed respiratory distress. Her ABG revealed a pH of 7.2 and PCO2 of 86. She was started on BiPAP. Follow-up ABG reveal worsening PCO2 of 96. She was given 40 mg of Lasix empirically. Follow-up chest x-ray 11/11 revealed  interval improvement in aeration of the right middle lobe and chronic interstitial coarsening with emphysema. -Patient appears to be breathing better on nasal cannula oxygen. -Pulmonology consulted; Dr. Luan Pulling recommendations noted and appreciated.  COPD with exacerbation. Patient's chest x-ray is consistent with emphysema and she has had a long history of smoking, she quit greater than 20 years ago. -Dr. Luan Pulling consulted and increased albuterol nebulizers to every 4 hours and started systemic steroids. Atrovent relatively contraindicated due to the patient's glaucoma. -Follow-up ABG pending. We'll continue BiPAP when necessary for hypercapnia.  Mild acute diastolic CHF. Patient's  chest x-ray 11/11 revealed pulmonary vascular congestion without overt pulmonary edema. It was reported that she had mild peripheral edema. 40 mg IV Lasix was given with improvement. Her echo revealed an EF of 60-65% and grade 2 diastolic dysfunction. -We'll hold off on further Lasix as she has no peripheral edema and no obvious pulmonary edema.  CAD/hyperlipidemia. Patient was continued on her statin and aspirin. Currently stable.  Type 2 diabetes with neuropathy.  Patient is treated chronically with Janumet and Actos; gabapentin for neuropathy. -She was started on sliding scale NovoLog and her oral agents were held. But will restart metformin part of Janumet and titrate insulin as needed in the setting of IV Solu-Medrol. Will order A1c.  Normocytic anemia. Patient was continued on multivitamin with iron and Protonix. -We'll order a few anemia studies and TSH for evaluation.  Difficulty with ambulation. This is long-standing and may be associated with peripheral neuropathy or DJD, exacerbated by infection and respiratory failure. -We'll order PT consult.     DVT prophylaxis: Lovenox Code Status: Full code Family Communication: Discussed with daughter Disposition Plan:  Discharge when clinically  appropriate   Consultants:   Pulmonology pending.  Procedures:  2-D echocardiogram 10/28/16: Study Conclusions  - Left ventricle: The cavity size was normal. Wall thickness was   increased in a pattern of mild LVH. Systolic function was normal.   The estimated ejection fraction was in the range of 60% to 65%.   Wall motion was normal; there were no regional wall motion   abnormalities. Features are consistent with a pseudonormal left   ventricular filling pattern, with concomitant abnormal relaxation   and increased filling pressure (grade 2 diastolic dysfunction). - Atrial septum: There was increased thickness of the septum,   consistent with lipomatous hypertrophy.  - Pulmonary arteries: PA peak pressure: 35 mm Hg (S).  Antimicrobials:   Azithromycin 10/26/16  Rocephin 10/26/16   Subjective:  Patient says that she is breathing better. She denies chest pain.  Objective: Vitals:   10/29/16 0300 10/29/16 0400 10/29/16 0500 10/29/16 0600  BP: (!) 106/58 106/74 126/72 135/74  Pulse: 100 96 97 (!) 102  Resp: 20 20 14  (!) 22  Temp:  97.9 F (36.6 C)    TempSrc:  Axillary    SpO2: 92% 94% 97% 97%  Weight:   78 kg (171 lb 15.3 oz)   Height:        Intake/Output Summary (Last 24 hours) at 10/29/16 0817 Last data filed at 10/29/16 0500  Gross per 24 hour  Intake              600 ml  Output             2100 ml  Net            -1500 ml   Filed Weights   10/27/16 2015 10/28/16 0500 10/29/16 0500  Weight: 77.4 kg (170 lb 10.2 oz) 78 kg (171 lb 15.3 oz) 78 kg (171 lb 15.3 oz)    Examination:  General exam: Appears calm and comfortable  Respiratory system: Diffuse scattered crackles. Respiratory effort normal. Cardiovascular system: S1, S2, with a soft systolic murmur. No pedal edema. Gastrointestinal system: Abdomen is nondistended, soft and nontender. No organomegaly or masses felt. Normal bowel sounds heard. Central nervous system: Alert and oriented. No focal  neurological deficits. Extremities: Symmetric 5 x 5 power. Skin: No rashes, lesions or ulcers Psychiatry: Judgement and insight appear normal. Mood & affect appropriate.     Data Reviewed: I have personally reviewed following labs and imaging studies  CBC:  Recent Labs Lab 10/26/16 1717 10/27/16 0706 10/28/16 0516  WBC 9.9 9.2 10.0  NEUTROABS 6.0  --   --   HGB 10.0* 9.6* 9.1*  HCT 36.6 36.2 34.2*  MCV 80.6 82.1 82.6  PLT 441* 408* [id number]   Basic Metabolic Panel:  Recent Labs Lab 10/26/16 1717  NA 134*  K 4.3  CL 97*  CO2 30  GLUCOSE 131*  BUN 18  CREATININE 0.64  CALCIUM 9.1   GFR: Estimated Creatinine Clearance: 56.1 mL/min (by C-G formula based on SCr of 0.64 mg/dL). Liver Function Tests: No results for input(s): AST, ALT, ALKPHOS, BILITOT, PROT, ALBUMIN in the last 168 hours. No results for input(s): LIPASE, AMYLASE in the last 168 hours. No results for input(s): AMMONIA in the last 168 hours. Coagulation Profile: No results for input(s): INR, PROTIME in the last 168 hours. Cardiac Enzymes: No results for input(s): CKTOTAL, CKMB, CKMBINDEX, TROPONINI in the last 168 hours. BNP (last 3 results) No results for input(s): PROBNP in the last 8760  hours. HbA1C: No results for input(s): HGBA1C in the last 72 hours. CBG:  Recent Labs Lab 10/28/16 0736 10/28/16 1131 10/28/16 1557 10/28/16 2130 10/29/16 0749  GLUCAP 152* 152* 148* 219* 130*   Lipid Profile: No results for input(s): CHOL, HDL, LDLCALC, TRIG, CHOLHDL, LDLDIRECT in the last 72 hours. Thyroid Function Tests: No results for input(s): TSH, T4TOTAL, FREET4, T3FREE, THYROIDAB in the last 72 hours. Anemia Panel: No results for input(s): VITAMINB12, FOLATE, FERRITIN, TIBC, IRON, RETICCTPCT in the last 72 hours. Sepsis Labs:  Recent Labs Lab 10/28/16 0516 10/29/16 0450  PROCALCITON <0.10 <0.10    Recent Results (from the past 240 hour(s))  Blood culture (routine x 2)     Status: None  (Preliminary result)   Collection Time: 10/26/16  6:43 PM  Result Value Ref Range Status   Specimen Description BLOOD LEFT ARM  Final   Special Requests BOTTLES DRAWN AEROBIC AND ANAEROBIC [place]  Final   Culture NO GROWTH 2 DAYS  Final   Report Status PENDING  Incomplete  Blood culture (routine x 2)     Status: None (Preliminary result)   Collection Time: 10/26/16  6:50 PM  Result Value Ref Range Status   Specimen Description BLOOD LEFT HAND  Final   Special Requests BOTTLES DRAWN AEROBIC AND ANAEROBIC [place]  Final   Culture NO GROWTH 2 DAYS  Final   Report Status PENDING  Incomplete  Culture, sputum-assessment     Status: None   Collection Time: 10/27/16  6:05 AM  Result Value Ref Range Status   Specimen Description SPUTUM  Final   Special Requests NONE  Final   Sputum evaluation   Final    MICROSCOPIC FINDINGS SUGGEST THAT THIS SPECIMEN IS NOT REPRESENTATIVE OF LOWER RESPIRATORY SECRETIONS. PLEASE RECOLLECT. NOTIFIED R.CHAPELLE AT 1040A ON [id number] BY THOMPSON S.    Report Status 10/27/2016 FINAL  Final  MRSA PCR Screening     Status: None   Collection Time: 10/27/16  9:48 PM  Result Value Ref Range Status   MRSA by PCR NEGATIVE NEGATIVE Final    Comment:        The GeneXpert MRSA Assay (FDA approved for NASAL specimens only), is one component of a comprehensive MRSA colonization surveillance program. It is not intended to diagnose MRSA infection nor to guide or monitor treatment for MRSA infections.          Radiology Studies: Dg Chest Port 1 View  Result Date: 10/27/2016 CLINICAL DATA:  Shortness of breath worsening today. EXAM: PORTABLE CHEST 1 VIEW COMPARISON:  10/26/2016 FINDINGS: The cardio pericardial silhouette is enlarged. Interstitial markings are diffusely coarsened with chronic features. There is pulmonary vascular congestion without overt pulmonary edema. Airspace disease and volume loss seen previously in the right middle lobe has improved in the  interval Ventriculostomy shunt tubing overlies the right para midline thorax. Patient is status post CABG. The visualized bony structures of the thorax are intact. IMPRESSION: Interval improvement in aeration right middle lobe. Cardiomegaly with emphysema and chronic interstitial coarsening. Electronically Signed   By: Misty Stanley M.D.   On: 10/27/2016 18:37        Scheduled Meds: . albuterol  2.5 mg Nebulization Q4H  . aspirin EC  81 mg Oral Daily  . azithromycin  500 mg Intravenous Q24H  . cefTRIAXone (ROCEPHIN)  IV  1 g Intravenous Q24H  . chlorhexidine  15 mL Mouth Rinse BID  . dorzolamide-timolol  1 drop Both Eyes BID  . enoxaparin (LOVENOX)  injection  40 mg Subcutaneous Q24H  . fenofibrate  160 mg Oral Daily  . gabapentin  100 mg Oral TID  . insulin aspart  0-15 Units Subcutaneous TID WC  . latanoprost  1 drop Both Eyes QHS  . mouth rinse  15 mL Mouth Rinse q12n4p  . methylPREDNISolone (SOLU-MEDROL) injection  40 mg Intravenous Q12H  . Milnacipran  50 mg Oral BID  . pantoprazole  40 mg Oral Daily  . pneumococcal 23 valent vaccine  0.5 mL Intramuscular Tomorrow-1000  . polyethylene glycol  17 g Oral Daily  . potassium chloride  20 mEq Oral BID  . rosuvastatin  40 mg Oral QPM  . senna  1 tablet Oral QHS   Continuous Infusions:   LOS: 3 days    Time spent: 103 minutes    Rexene Alberts, MD [facility] Pager [phone]   If 7PM-7AM, please contact night-coverage [email or web address] Password [email or web address] 10/29/2016, 8:17 AM

## 2016-10-29 NOTE — Consult Note (Signed)
Consult requested by: [facility], Dr. Caryn Section Consult requested for hypercapnic respiratory failure/pneumonia:  HPI: This is an 82 year old who lives at home alone and who has what seems to be chronic hypoxic respiratory failure. According to her daughter at bedside she has O2 saturations run in the 80s pretty much all the time. She is not on home oxygen. She had increasing cough and congestion and was falling asleep during the day and her daughter took her to her primary care physician who diagnosed her with pneumonia and sent her to the emergency department. She was found to have cardiac enlargement pulmonary vascular congestion and right middle lobe pneumonia. Her past medical history is positive for coronary disease status post bypass grafting depression hydrocephalus spinal stenosis diabetes chronic leg pain. She is not known to have sleep apnea. She has not had any chest pain nausea vomiting diarrhea and her cough has been nonproductive  Past Medical History:  Diagnosis Date  . Coronary atherosclerosis of native coronary artery    Multivessel status post CABG  . Depression   . Glaucoma   . Hydrocephalus   . Hyperlipidemia   . Nephrolithiasis   . Spinal stenosis   . Type 2 diabetes mellitus (HCC)      Family History  Problem Relation Age of Onset  . Cancer Father   . Hyperlipidemia Daughter   . Diabetes Brother   . Heart disease Brother      Social History   Social History  . Marital status: Widowed    Spouse name: N/A  . Number of children: N/A  . Years of education: N/A   Social History Main Topics  . Smoking status: Former Smoker    Packs/day: 2.00    Types: Cigarettes    Start date: 01/15/1958    Quit date: 12/17/1993  . Smokeless tobacco: Never Used  . Alcohol use No  . Drug use: No  . Sexual activity: No   Other Topics Concern  . None   Social History Narrative  . None     ROS: She's not had any hemoptysis chest pain nausea vomiting diarrhea or  urinary symptoms. She has had confusion and sleepiness and complaints of pain in her legs chronically. Otherwise 10 point review of systems is negative    Objective: Vital signs in last 24 hours: Temp:  [97 F (36.1 C)-100 F (37.8 C)] 97.9 F (36.6 C) (11/13 0400) Pulse Rate:  [87-111] 102 (11/13 0600) Resp:  [14-27] 22 (11/13 0600) BP: (87-135)/(47-81) 135/74 (11/13 0600) SpO2:  [91 %-100 %] 97 % (11/13 0600) FiO2 (%):  [40 %] 40 % (11/12 2006) Weight:  [78 kg (171 lb 15.3 oz)] 78 kg (171 lb 15.3 oz) (11/13 0500) Weight change: 0.6 kg (1 lb 5.2 oz) Last BM Date: 10/27/16  Intake/Output from previous day: 11/12 0701 - 11/13 0700 In: 600 [P.O.:600] Out: 2100 [Urine:2100]  PHYSICAL EXAM Constitutional: She is awake and alert and on nasal oxygen. She is obese. Eyes: Pupils are reactive. EOMI. Ears nose mouth and throat her mucous membranes are moist. Cardiovascular: Her heart is regular with tachycardia and no gallop. No edema. Respiratory: Slightly increased respiratory effort and rate. She has diminished breath sounds with prolonged expiratory phase on lung exam. Gastrointestinal: Her abdomen is soft without masses. Skin: Warm and dry. She has erythema of the left hand. Neurological she is awake and alert.  Lab Results: Basic Metabolic Panel:  Recent Labs  10/26/16 1717  NA 134*  K 4.3  CL 97*  CO2 30  GLUCOSE 131*  BUN 18  CREATININE 0.64  CALCIUM 9.1   Liver Function Tests: No results for input(s): AST, ALT, ALKPHOS, BILITOT, PROT, ALBUMIN in the last 72 hours. No results for input(s): LIPASE, AMYLASE in the last 72 hours. No results for input(s): AMMONIA in the last 72 hours. CBC:  Recent Labs  10/26/16 1717 10/27/16 0706 10/28/16 0516  WBC 9.9 9.2 10.0  NEUTROABS 6.0  --   --   HGB 10.0* 9.6* 9.1*  HCT 36.6 36.2 34.2*  MCV 80.6 82.1 82.6  PLT 441* 408* 332   Cardiac Enzymes: No results for input(s): CKTOTAL, CKMB, CKMBINDEX, TROPONINI in the last 72  hours. BNP: No results for input(s): PROBNP in the last 72 hours. D-Dimer: No results for input(s): DDIMER in the last 72 hours. CBG:  Recent Labs  10/26/16 2039 10/28/16 0736 10/28/16 1131 10/28/16 1557 10/28/16 2130  GLUCAP 156* 152* 152* 148* 219*   Hemoglobin A1C: No results for input(s): HGBA1C in the last 72 hours. Fasting Lipid Panel: No results for input(s): CHOL, HDL, LDLCALC, TRIG, CHOLHDL, LDLDIRECT in the last 72 hours. Thyroid Function Tests: No results for input(s): TSH, T4TOTAL, FREET4, T3FREE, THYROIDAB in the last 72 hours. Anemia Panel: No results for input(s): VITAMINB12, FOLATE, FERRITIN, TIBC, IRON, RETICCTPCT in the last 72 hours. Coagulation: No results for input(s): LABPROT, INR in the last 72 hours. Urine Drug Screen: Drugs of Abuse  No results found for: LABOPIA, COCAINSCRNUR, LABBENZ, AMPHETMU, THCU, LABBARB  Alcohol Level: No results for input(s): ETH in the last 72 hours. Urinalysis:  Recent Labs  10/27/16 [place]  LABSPEC 1.020  PHURINE 6.0  GLUCOSEU NEGATIVE  HGBUR NEGATIVE  BILIRUBINUR NEGATIVE  KETONESUR NEGATIVE  PROTEINUR NEGATIVE  NITRITE NEGATIVE  LEUKOCYTESUR NEGATIVE   Misc. Labs:   ABGS:  Recent Labs  10/28/16 1611  PHART 7.214*  PO2ART 75.5*  HCO3 31.7*     MICROBIOLOGY: Recent Results (from the past 240 hour(s))  Blood culture (routine x 2)     Status: None (Preliminary result)   Collection Time: 10/26/16  6:43 PM  Result Value Ref Range Status   Specimen Description BLOOD LEFT ARM  Final   Special Requests BOTTLES DRAWN AEROBIC AND ANAEROBIC [place]  Final   Culture NO GROWTH 2 DAYS  Final   Report Status PENDING  Incomplete  Blood culture (routine x 2)     Status: None (Preliminary result)   Collection Time: 10/26/16  6:50 PM  Result Value Ref Range Status   Specimen Description BLOOD LEFT HAND  Final   Special Requests BOTTLES DRAWN AEROBIC AND ANAEROBIC [place]  Final   Culture  NO GROWTH 2 DAYS  Final   Report Status PENDING  Incomplete  Culture, sputum-assessment     Status: None   Collection Time: 10/27/16  6:05 AM  Result Value Ref Range Status   Specimen Description SPUTUM  Final   Special Requests NONE  Final   Sputum evaluation   Final    MICROSCOPIC FINDINGS SUGGEST THAT THIS SPECIMEN IS NOT REPRESENTATIVE OF LOWER RESPIRATORY SECRETIONS. PLEASE RECOLLECT. NOTIFIED R.CHAPELLE AT 1040A ON [id number] BY THOMPSON S.    Report Status 10/27/2016 FINAL  Final  MRSA PCR Screening     Status: None   Collection Time: 10/27/16  9:48 PM  Result Value Ref Range Status   MRSA by PCR NEGATIVE NEGATIVE Final    Comment:        The GeneXpert MRSA  Assay (FDA approved for NASAL specimens only), is one component of a comprehensive MRSA colonization surveillance program. It is not intended to diagnose MRSA infection nor to guide or monitor treatment for MRSA infections.     Studies/Results: Dg Chest Port 1 View  Result Date: 10/27/2016 CLINICAL DATA:  Shortness of breath worsening today. EXAM: PORTABLE CHEST 1 VIEW COMPARISON:  10/26/2016 FINDINGS: The cardio pericardial silhouette is enlarged. Interstitial markings are diffusely coarsened with chronic features. There is pulmonary vascular congestion without overt pulmonary edema. Airspace disease and volume loss seen previously in the right middle lobe has improved in the interval Ventriculostomy shunt tubing overlies the right para midline thorax. Patient is status post CABG. The visualized bony structures of the thorax are intact. IMPRESSION: Interval improvement in aeration right middle lobe. Cardiomegaly with emphysema and chronic interstitial coarsening. Electronically Signed   By: Misty Stanley M.D.   On: 10/27/2016 18:37    Medications:  Prior to Admission:  Prescriptions Prior to Admission  Medication Sig Dispense Refill Last Dose  . aspirin EC 81 MG tablet Take 81 mg by mouth daily.   10/26/2016 at Unknown  time  . Choline Fenofibrate 135 MG capsule Take 135 mg by mouth daily.   10/26/2016 at Unknown time  . dorzolamide-timolol (COSOPT) 22.3-6.8 MG/ML ophthalmic solution Place 1 drop into both eyes 2 (two) times daily.    10/26/2016 at Unknown time  . gabapentin (NEURONTIN) 100 MG capsule Take 100 mg by mouth 3 (three) times daily.    10/26/2016 at Unknown time  . latanoprost (XALATAN) 0.005 % ophthalmic solution Place 1 drop into both eyes at bedtime.   10/25/2016 at Unknown time  . magnesium hydroxide (MILK OF MAGNESIA) 400 MG/5ML suspension Take 30 mLs by mouth every other day.   Past Week at Unknown time  . Melatonin 5 MG TABS Take 5 mg by mouth at bedtime.   10/25/2016 at Unknown time  . Milnacipran (SAVELLA) 50 MG TABS Take 50 mg by mouth 2 (two) times daily.   10/26/2016 at Unknown time  . Multiple Vitamins-Minerals (MULTIVITAMIN WITH MINERALS) tablet Take 1 tablet by mouth every evening.    10/25/2016 at Unknown time  . nitroGLYCERIN (NITROSTAT) 0.4 MG SL tablet Place 1 tablet (0.4 mg total) under the tongue every 5 (five) minutes as needed for chest pain. 25 tablet 3 unknown  . pantoprazole (PROTONIX) 40 MG tablet Take 40 mg by mouth daily.    10/26/2016 at Unknown time  . pioglitazone (ACTOS) 15 MG tablet Take 15 mg by mouth daily.    10/26/2016 at Unknown time  . rosuvastatin (CRESTOR) 40 MG tablet Take 40 mg by mouth every evening.    10/25/2016 at Unknown time  . sitaGLIPtan-metformin (JANUMET) 50-1000 MG per tablet Take 1 tablet by mouth 2 (two) times daily with a meal.   10/26/2016 at Unknown time   Scheduled: . albuterol  2.5 mg Nebulization Q4H  . aspirin EC  81 mg Oral Daily  . azithromycin  500 mg Intravenous Q24H  . cefTRIAXone (ROCEPHIN)  IV  1 g Intravenous Q24H  . chlorhexidine  15 mL Mouth Rinse BID  . dorzolamide-timolol  1 drop Both Eyes BID  . enoxaparin (LOVENOX) injection  40 mg Subcutaneous Q24H  . fenofibrate  160 mg Oral Daily  . gabapentin  100 mg Oral TID  .  insulin aspart  0-15 Units Subcutaneous TID WC  . latanoprost  1 drop Both Eyes QHS  . mouth rinse  15 mL  Mouth Rinse q12n4p  . methylPREDNISolone (SOLU-MEDROL) injection  40 mg Intravenous Q12H  . Milnacipran  50 mg Oral BID  . pantoprazole  40 mg Oral Daily  . pneumococcal 23 valent vaccine  0.5 mL Intramuscular Tomorrow-1000  . polyethylene glycol  17 g Oral Daily  . rosuvastatin  40 mg Oral QPM  . senna  1 tablet Oral QHS   Continuous:  [id number]  Assesment: She was admitted with community-acquired pneumonia. She has hypercapnic respiratory failure requiring BiPAP. She is better now. She is on appropriate treatment for pneumonia but I would add steroids. Increase her nebulizer treatments to every 4 hours while awake. She has glaucoma which is a relative contraindication to ipratropium so on going to hold off on that for now. I would manage her somnolence clinically. I think it's reasonable to obtain a blood gas this morning but if as she did yesterday she gets more somnolent later I would simply put her back on BiPAP without necessarily documenting her PCO2 was up because that has been documented already. Principal Problem:   CAP (community acquired pneumonia) Active Problems:   Difficulty walking   Mixed hyperlipidemia   Type 2 diabetes mellitus ([place])    Plan: As above.  Thanks for allowing me to see her with you    LOS: 3 days   Haneefah Venturini L 10/29/2016, 7:46 AM

## 2016-10-30 LAB — GLUCOSE, CAPILLARY
GLUCOSE-CAPILLARY: 152 mg/dL — AB (ref 65–99)
Glucose-Capillary: 208 mg/dL — ABNORMAL HIGH (ref 65–99)
Glucose-Capillary: 208 mg/dL — ABNORMAL HIGH (ref 65–99)
Glucose-Capillary: 154 mg/dL — ABNORMAL HIGH (ref 65–99)

## 2016-10-30 LAB — BASIC METABOLIC PANEL
ANION GAP: 9 (ref 5–15)
BUN: 35 mg/dL — ABNORMAL HIGH (ref 6–20)
CHLORIDE: 89 mmol/L — AB (ref 101–111)
CO2: 37 mmol/L — ABNORMAL HIGH (ref 22–32)
Calcium: 9.3 mg/dL (ref 8.9–10.3)
Creatinine, Ser: 0.65 mg/dL (ref 0.44–1.00)
GFR calc non Af Amer: >60 mL/min (ref >60)
Glucose, Bld: 189 mg/dL — ABNORMAL HIGH (ref 65–99)
POTASSIUM: 4.3 mmol/L (ref 3.5–5.1)
SODIUM: 135 mmol/L (ref 135–145)

## 2016-10-30 LAB — IRON AND TIBC
Iron: 16 ug/dL — ABNORMAL LOW (ref 28–170)
Saturation Ratios: 2 % — ABNORMAL LOW (ref 10.4–31.8)
TIBC: 735 ug/dL — ABNORMAL HIGH (ref 250–450)
UIBC: 719 ug/dL

## 2016-10-30 LAB — CULTURE, RESPIRATORY W GRAM STAIN

## 2016-10-30 LAB — URINE CULTURE: Culture: NO GROWTH

## 2016-10-30 LAB — TSH: TSH: 0.712 u[IU]/mL (ref 0.350–4.500)

## 2016-10-30 LAB — FERRITIN: Ferritin: 8 ng/mL — ABNORMAL LOW (ref 11–307)

## 2016-10-30 LAB — VITAMIN B12: VITAMIN B 12: 165 pg/mL — AB (ref 180–914)

## 2016-10-30 MED ORDER — AZITHROMYCIN 250 MG PO TABS
500.0000 mg | ORAL_TABLET | Freq: Every day | ORAL | Status: AC
  Administered 2016-10-30 – 2016-11-02 (×4): 500 mg via ORAL
  Filled 2016-10-30 (×4): qty 2

## 2016-10-30 MED ORDER — ALBUTEROL SULFATE (2.5 MG/3ML) 0.083% IN NEBU
2.5000 mg | INHALATION_SOLUTION | RESPIRATORY_TRACT | Status: DC
  Administered 2016-10-30 – 2016-10-31 (×2): 2.5 mg via RESPIRATORY_TRACT
  Filled 2016-10-30 (×2): qty 3

## 2016-10-30 MED ORDER — MOMETASONE FURO-FORMOTEROL FUM 100-5 MCG/ACT IN AERO
2.0000 | INHALATION_SPRAY | Freq: Two times a day (BID) | RESPIRATORY_TRACT | Status: DC
  Administered 2016-10-30 – 2016-11-04 (×10): 2 via RESPIRATORY_TRACT
  Filled 2016-10-30: qty 8.8

## 2016-10-30 MED ORDER — INSULIN GLARGINE 100 UNIT/ML ~~LOC~~ SOLN
10.0000 [IU] | Freq: Every day | SUBCUTANEOUS | Status: DC
  Administered 2016-10-30 – 2016-11-03 (×6): 10 [IU] via SUBCUTANEOUS
  Filled 2016-10-30 (×6): qty 0.1

## 2016-10-30 NOTE — Evaluation (Signed)
Physical Therapy Evaluation Patient Details Name: [name] MRN: [id number] DOB: 18-Oct-1933 Today's Date: 10/30/2016   History of Present Illness  82 y.o. female with a history of hyperlipidemia, coronary artery disease status post multivessel CABG, depression, hypertension, diabetes type 2. Patient has been having a productive cough with sputum for the past couple of weeks that has been increasing. Patient has been increasing shortness of breath, particularly with exertion and improved with rest. Patient saw her primary care physician today, who obtained a chest x-ray. After diagnosing her with pneumonia, the patient was instructed to come to the emergency department for evaluation.  Dx: CAP, acute on chronic respiratory failure with hypercapnia, COPD exacerbation.    Clinical Impression  Pt received in bed, and was agreeable to PT evaluation.  Granddaughter present, and assists with obtaining PLOF information.  Pt normally uses a rollator walker for functional mobility at home, and is mostly a household ambulator.  She is modified independent with dressing and bathing.  During PT evaluation, she ambulated 55ft with RW and Min guard on 2L of O2.  At this point, she would benefit from HHPT upon d/c to continue to improve pt's strength, balance, and functional mobility.      Follow Up Recommendations Home health PT;Supervision - Intermittent    Equipment Recommendations  None recommended by PT    Recommendations for Other Services       Precautions / Restrictions Precautions Precautions: Fall Precaution Comments: Due to pt's immobility Restrictions Weight Bearing Restrictions: No      Mobility  Bed Mobility Overal bed mobility: Needs Assistance Bed Mobility: Supine to Sit     Supine to sit: Min guard;HOB elevated        Transfers Overall transfer level: Needs assistance Equipment used: Rolling walker (2 wheeled) Transfers: Sit to/from Stand Sit to Stand: Min guard  (increased time)            Ambulation/Gait Ambulation/Gait assistance: Min guard Ambulation Distance (Feet): 60 Feet Assistive device: Rolling walker (2 wheeled) Gait Pattern/deviations: Trunk flexed;Decreased stride length;Shuffle     General Gait Details: Pt expressed limited distance due to LE fatigue.   Stairs            Wheelchair Mobility    Modified Rankin (Stroke Patients Only)       Balance Overall balance assessment: Needs assistance   Sitting balance-Leahy Scale: Good     Standing balance support: Bilateral upper extremity supported Standing balance-Leahy Scale: Fair                               Pertinent Vitals/Pain Pain Assessment: 0-10 Pain Score: 3  Pain Location: L side of her head - "like a little pain hits you" Pain Intervention(s): Limited activity within patient's tolerance;Monitored during session;Repositioned    Home Living   Living Arrangements: Alone Available Help at Discharge: Family (Dtr next door, and another dtr behind.  Pt has MOW, and family also assist with meals at times. ) Type of Home: Mobile home Home Access: Stairs to enter   Entrance Stairs-Number of Steps: 3 steps going in the back with HR.  Home Layout: One level Home Equipment: Tub bench;Walker - 4 wheels;Bedside commode;Wheelchair - manual;Cane - single point;Walker - 2 wheels (lift chair)      Prior Function Level of Independence: Independent with assistive device(s)   Gait / Transfers Assistance Needed: Pt states she uses the rollator for ambulation in the house.  ADL's / Homemaking Assistance Needed: independent with dressing and bathing.  Not driving, Dtr does the grocery store, and puts them away in the house.          Hand Dominance   Dominant Hand: Right    Extremity/Trunk Assessment   Upper Extremity Assessment: Generalized weakness           Lower Extremity Assessment: Generalized weakness         Communication    Communication: No difficulties  Cognition Arousal/Alertness: Awake/alert Behavior During Therapy: WFL for tasks assessed/performed Overall Cognitive Status: Impaired/Different from baseline Area of Impairment: Orientation Orientation Level: Time (February or March, and can't state the year, but states Trump is the Software engineer. )                  General Comments      Exercises     Assessment/Plan    PT Assessment Patient needs continued PT services  PT Problem List Decreased strength;Decreased activity tolerance;Decreased balance;Decreased mobility;Decreased cognition;Decreased knowledge of use of DME;Cardiopulmonary status limiting activity;Decreased safety awareness;Obesity          PT Treatment Interventions DME instruction;Gait training;Functional mobility training;Therapeutic activities;Therapeutic exercise;Balance training;Stair training;Cognitive remediation;Patient/family education    PT Goals (Current goals can be found in the Care Plan section)  Acute Rehab PT Goals Patient Stated Goal: Pt wants to get stronger and go back home.  PT Goal Formulation: With patient Time For Goal Achievement: 11/06/16 Potential to Achieve Goals: Good    Frequency Min 3X/week   Barriers to discharge Decreased caregiver support Pt lives alone, but has multiple family members that live nextdoor and behind her.     Co-evaluation               End of Session Equipment Utilized During Treatment: Gait belt;Oxygen Activity Tolerance: Patient tolerated treatment well;Patient limited by fatigue Patient left: in chair;with call bell/phone within reach;with family/visitor present      Functional Assessment Tool Used: [place] "6-clicks"  Functional Limitation: Mobility: Walking and moving around Mobility: Walking and Moving Around Current Status [phone]): At least 20 percent but less than 40 percent impaired, limited or restricted Mobility: Walking and Moving  Around Goal Status [phone]): At least 1 percent but less than 20 percent impaired, limited or restricted    Time: [id number] PT Time Calculation (min) (ACUTE ONLY): 31 min   Charges:   PT Evaluation $PT Eval Low Complexity: 1 Procedure PT Treatments $Gait Training: 8-22 mins   PT G Codes:   PT G-Codes **NOT FOR INPATIENT CLASS** Functional Assessment Tool Used: [employer] "6-clicks"  Functional Limitation: Mobility: Walking and moving around Mobility: Walking and Moving Around Current Status [phone]): At least 20 percent but less than 40 percent impaired, limited or restricted Mobility: Walking and Moving Around Goal Status [phone]): At least 1 percent but less than 20 percent impaired, limited or restricted    Beth Ithan Touhey, PT, DPT X: [phone]

## 2016-10-30 NOTE — Progress Notes (Signed)
Inpatient Diabetes Program Recommendations  AACE/ADA: New Consensus Statement on Inpatient Glycemic Control (2015)  Target Ranges:  Prepandial:   less than 140 mg/dL      Peak postprandial:   less than 180 mg/dL (1-2 hours)      Critically ill patients:  140 - 180 mg/dL   Results for EDWYNA, KAFKA (MRN [id number]) as of 10/30/2016 08:42  Ref. Range 10/29/2016 07:49 10/29/2016 11:46 10/29/2016 16:42 10/29/2016 21:49 10/30/2016 07:39  Glucose-Capillary Latest Ref Range: 65 - 99 mg/dL 130 (H) 221 (H) 237 (H) 144 (H) 152 (H)   Review of Glycemic Control  Diabetes history: DM2 Outpatient Diabetes medications: Actos 15 mg daily, Janumet 50-1000 mg BID Current orders for Inpatient glycemic control: Metformin 1000 mg BID, Novolog 0-15 units TID with meals  Inpatient Diabetes Program Recommendations: Insulin - Meal Coverage: If steroids are continued and patient is eating at least 50% of meals, please consider ordering Novolog 3 units TID with meals for meal coverage.  Thanks, Barnie Alderman, RN, MSN, CDE Diabetes Coordinator Inpatient Diabetes Program [phone] (Team Pager from 8am to 5pm)

## 2016-10-30 NOTE — Progress Notes (Signed)
PHARMACIST - PHYSICIAN COMMUNICATION DR:   Caryn Section CONCERNING: Antibiotic IV to Oral Route Change Policy  RECOMMENDATION: This patient is receiving ZITHROMAX by the intravenous route.  Based on criteria approved by the Pharmacy and Therapeutics Committee, the antibiotic(s) is/are being converted to the equivalent oral dose form(s).  DESCRIPTION: These criteria include:  Patient being treated for a respiratory tract infection, urinary tract infection, cellulitis or clostridium difficile associated diarrhea if on metronidazole  The patient is not neutropenic and does not exhibit a GI malabsorption state  The patient is eating (either orally or via tube) and/or has been taking other orally administered medications for a least 24 hours  The patient is improving clinically and has a Tmax < 100.5  If you have questions about this conversion, please contact the Pharmacy Department  [x]   [phone] )  [facility] []   [phone] )  [facility] []   [phone] )  [facility] []   [phone] )  [place], PharmD Clinical Pharmacist Pager:  [phone] 10/30/2016

## 2016-10-30 NOTE — Progress Notes (Signed)
Sextremities normal, atraumatic, no cyanosis or edemaubjective: She was admitted with cough and congestion and community acquired pneumonia. At baseline she has COPD and apparently chronic hypoxic respiratory failure although she does not use oxygen at home. She has had markedly elevated PCO2 here but has been awake and alert in spite of her hypercapnia. This morning she says she feels okay. No new complaints. She denies any chest pain or hemoptysis PND or orthopnea  Objective: Vital signs in last 24 hours: Temp:  [96.8 F (36 C)-98.9 F (37.2 C)] 97 F (36.1 C) (11/14 0400) Pulse Rate:  [92-112] 92 (11/14 0339) Resp:  [20] 20 (11/13 1429) SpO2:  [92 %-98 %] 92 % (11/14 0341) FiO2 (%):  [40 %] 40 % (11/14 0341) Weight:  [78.3 kg (172 lb 9.9 oz)] 78.3 kg (172 lb 9.9 oz) (11/14 0500) Weight change: 0.3 kg (10.6 oz) Last BM Date: 10/27/16  Intake/Output from previous day: 11/13 0701 - 11/14 0700 In: 720 [P.O.:720] Out: 950 [Urine:950]  PHYSICAL EXAM General appearance: alert, cooperative and no distress Resp: rhonchi bilaterally Cardio: regular rate and rhythm, S1, S2 normal, no murmur, click, rub or gallop GI: soft, non-tender; bowel sounds normal; no masses,  no organomegaly Extremities: extMucous membranes are moist skin warm and dryities normal, atraumatic, no cyanosis or edema Skin warm and dry. Mucous membranes are moist  Lab Results:  Results for orders placed or performed during the hospital encounter of 10/26/16 (from the past 48 hour(s))  Glucose, capillary     Status: Abnormal   Collection Time: 10/28/16 11:31 AM  Result Value Ref Range   Glucose-Capillary 152 (H) 65 - 99 mg/dL  Glucose, capillary     Status: Abnormal   Collection Time: 10/28/16  3:57 PM  Result Value Ref Range   Glucose-Capillary 148 (H) 65 - 99 mg/dL   Comment 1 Notify RN    Comment 2 Document in Chart   Blood gas, arterial     Status: Abnormal   Collection Time: 10/28/16  4:11 PM  Result Value  Ref Range   O2 Content 4.0 L/min   Delivery systems NASAL CANNULA    pH, Arterial 7.214 (L) 7.350 - 7.450   pCO2 arterial 96.2 (HH) 32.0 - 48.0 mmHg    Comment: CRITICAL RESULT CALLED TO, READ BACK BY AND VERIFIED WITH: BOB LEE, RN BY JESSICA LASLEY, RRT ON 1617 AT 10/28/2016    pO2, Arterial 75.5 (L) 83.0 - 108.0 mmHg   Bicarbonate 31.7 (H) 20.0 - 28.0 mmol/L   Acid-Base Excess 9.7 (H) 0.0 - 2.0 mmol/L   O2 Saturation 92.1 %   Collection site LEFT RADIAL    Drawn by COLLECTED BY RT    Sample type ARTERIAL    Allens test (pass/fail) PASS PASS  Glucose, capillary     Status: Abnormal   Collection Time: 10/28/16  9:30 PM  Result Value Ref Range   Glucose-Capillary 219 (H) 65 - 99 mg/dL   Comment 1 Notify RN    Comment 2 Document in Chart   Procalcitonin     Status: None   Collection Time: 10/29/16  4:50 AM  Result Value Ref Range   Procalcitonin <0.10 ng/mL    Comment:        Interpretation: PCT (Procalcitonin) <= 0.5 ng/mL: Systemic infection (sepsis) is not likely. Local bacterial infection is possible. (NOTE)         ICU PCT Algorithm  Non ICU PCT Algorithm    ----------------------------     ------------------------------         PCT < 0.25 ng/mL                 PCT < 0.1 ng/mL     Stopping of antibiotics            Stopping of antibiotics       strongly encouraged.               strongly encouraged.    ----------------------------     ------------------------------       PCT level decrease by               PCT < 0.25 ng/mL       >= 80% from peak PCT       OR PCT 0.25 - 0.5 ng/mL          Stopping of antibiotics                                             encouraged.     Stopping of antibiotics           encouraged.    ----------------------------     ------------------------------       PCT level decrease by              PCT >= 0.25 ng/mL       < 80% from peak PCT        AND PCT >= 0.5 ng/mL            Continuin g antibiotics                                               encouraged.       Continuing antibiotics            encouraged.    ----------------------------     ------------------------------     PCT level increase compared          PCT > 0.5 ng/mL         with peak PCT AND          PCT >= 0.5 ng/mL             Escalation of antibiotics                                          strongly encouraged.      Escalation of antibiotics        strongly encouraged.   Glucose, capillary     Status: Abnormal   Collection Time: 10/29/16  7:49 AM  Result Value Ref Range   Glucose-Capillary 130 (H) 65 - 99 mg/dL   Comment 1 Notify RN   Influenza panel by PCR (type A & B, H1N1)     Status: None   Collection Time: 10/29/16  8:30 AM  Result Value Ref Range   Influenza A By PCR NEGATIVE NEGATIVE   Influenza B By PCR NEGATIVE NEGATIVE    Comment: (NOTE) The Xpert Xpress Flu assay is intended as an aid in the diagnosis of  influenza  and should not be used as a sole basis for treatment.  This  assay is FDA approved for nasopharyngeal swab specimens only. Nasal  washings and aspirates are unacceptable for Xpert Xpress Flu testing.   Blood gas, arterial     Status: Abnormal   Collection Time: 10/29/16  8:50 AM  Result Value Ref Range   O2 Content 3.5 L/min   Delivery systems NASAL CANNULA    pH, Arterial 7.311 (L) 7.350 - 7.450   pCO2 arterial 81.2 (HH) 32.0 - 48.0 mmHg    Comment: RBV BONNIE,RN FROM JENNIFER ROWLAND,RRT ON 10/29/16 AT 0913   pO2, Arterial 76.2 (L) 83.0 - 108.0 mmHg   Bicarbonate 35.6 (H) 20.0 - 28.0 mmol/L   Acid-Base Excess 13.2 (H) 0.0 - 2.0 mmol/L   O2 Saturation 93.2 %   Patient temperature 37.0    Collection site RIGHT RADIAL    Drawn by [id number]    Sample type ARTERIAL DRAW    Allens test (pass/fail) PASS PASS  Glucose, capillary     Status: Abnormal   Collection Time: 10/29/16 11:46 AM  Result Value Ref Range   Glucose-Capillary 221 (H) 65 - 99 mg/dL   Comment 1 Notify RN   Glucose, capillary     Status:  Abnormal   Collection Time: 10/29/16  4:42 PM  Result Value Ref Range   Glucose-Capillary 237 (H) 65 - 99 mg/dL   Comment 1 Notify RN   Glucose, capillary     Status: Abnormal   Collection Time: 10/29/16  9:49 PM  Result Value Ref Range   Glucose-Capillary 144 (H) 65 - 99 mg/dL  Basic metabolic panel     Status: Abnormal   Collection Time: 10/30/16  4:44 AM  Result Value Ref Range   Sodium 135 135 - 145 mmol/L   Potassium 4.3 3.5 - 5.1 mmol/L   Chloride 89 (L) 101 - 111 mmol/L   CO2 37 (H) 22 - 32 mmol/L   Glucose, Bld 189 (H) 65 - 99 mg/dL   BUN 35 (H) 6 - 20 mg/dL   Creatinine, Ser 0.65 0.44 - 1.00 mg/dL   Calcium 9.3 8.9 - 10.3 mg/dL   GFR calc non Af Amer >60 >60 mL/min   GFR calc Af Amer >60 >60 mL/min    Comment: (NOTE) The eGFR has been calculated using the CKD EPI equation. This calculation has not been validated in all clinical situations. eGFR's persistently <60 mL/min signify possible Chronic Kidney Disease.    Anion gap 9 5 - 15  TSH     Status: None   Collection Time: 10/30/16  4:44 AM  Result Value Ref Range   TSH 0.712 0.350 - 4.500 uIU/mL    Comment: Performed by a 3rd Generation assay with a functional sensitivity of <=0.01 uIU/mL.  Glucose, capillary     Status: Abnormal   Collection Time: 10/30/16  7:39 AM  Result Value Ref Range   Glucose-Capillary 152 (H) 65 - 99 mg/dL   Comment 1 Notify RN    Comment 2 Document in Chart     ABGS  Recent Labs  10/29/16 0850  PHART 7.311*  PO2ART 76.2*  HCO3 35.6*   CULTURES Recent Results (from the past 240 hour(s))  Blood culture (routine x 2)     Status: None (Preliminary result)   Collection Time: 10/26/16  6:43 PM  Result Value Ref Range Status   Specimen Description BLOOD LEFT ARM  Final   Special Requests BOTTLES DRAWN AEROBIC AND  ANAEROBIC 6CC EACH  Final   Culture NO GROWTH 3 DAYS  Final   Report Status PENDING  Incomplete  Blood culture (routine x 2)     Status: None (Preliminary result)    Collection Time: 10/26/16  6:50 PM  Result Value Ref Range Status   Specimen Description BLOOD LEFT HAND  Final   Special Requests BOTTLES DRAWN AEROBIC AND ANAEROBIC [place]  Final   Culture NO GROWTH 3 DAYS  Final   Report Status PENDING  Incomplete  Culture, sputum-assessment     Status: None   Collection Time: 10/27/16  6:05 AM  Result Value Ref Range Status   Specimen Description SPUTUM  Final   Special Requests NONE  Final   Sputum evaluation   Final    MICROSCOPIC FINDINGS SUGGEST THAT THIS SPECIMEN IS NOT REPRESENTATIVE OF LOWER RESPIRATORY SECRETIONS. PLEASE RECOLLECT. NOTIFIED R.CHAPELLE AT 1040A ON [id number] BY THOMPSON S.    Report Status 10/27/2016 FINAL  Final  Culture, respiratory (NON-Expectorated)     Status: None (Preliminary result)   Collection Time: 10/27/16  7:10 PM  Result Value Ref Range Status   Specimen Description SPU  Final   Special Requests NONE  Final   Gram Stain   Final    RARE WBC PRESENT, PREDOMINANTLY PMN FEW SQUAMOUS EPITHELIAL CELLS PRESENT ABUNDANT YEAST FEW GRAM NEGATIVE RODS RARE GRAM POSITIVE COCCI IN PAIRS RARE GRAM VARIABLE ROD    Culture [facility] Performed at [facility]   Final   Report Status PENDING  Incomplete  MRSA PCR Screening     Status: None   Collection Time: 10/27/16  9:48 PM  Result Value Ref Range Status   MRSA by PCR NEGATIVE NEGATIVE Final    Comment:        The GeneXpert MRSA Assay (FDA approved for NASAL specimens only), is one component of a comprehensive MRSA colonization surveillance program. It is not intended to diagnose MRSA infection nor to guide or monitor treatment for MRSA infections.    Studies/Results: No results found.  Medications:  Prior to Admission:  Prescriptions Prior to Admission  Medication Sig Dispense Refill Last Dose  . aspirin EC 81 MG tablet Take 81 mg by mouth daily.   10/26/2016 at Unknown time  . Choline Fenofibrate 135 MG capsule Take 135 mg by mouth daily.    10/26/2016 at Unknown time  . dorzolamide-timolol (COSOPT) 22.3-6.8 MG/ML ophthalmic solution Place 1 drop into both eyes 2 (two) times daily.    10/26/2016 at Unknown time  . gabapentin (NEURONTIN) 100 MG capsule Take 100 mg by mouth 3 (three) times daily.    10/26/2016 at Unknown time  . latanoprost (XALATAN) 0.005 % ophthalmic solution Place 1 drop into both eyes at bedtime.   10/25/2016 at Unknown time  . magnesium hydroxide (MILK OF MAGNESIA) 400 MG/5ML suspension Take 30 mLs by mouth every other day.   Past Week at Unknown time  . Melatonin 5 MG TABS Take 5 mg by mouth at bedtime.   10/25/2016 at Unknown time  . Milnacipran (SAVELLA) 50 MG TABS Take 50 mg by mouth 2 (two) times daily.   10/26/2016 at Unknown time  . Multiple Vitamins-Minerals (MULTIVITAMIN WITH MINERALS) tablet Take 1 tablet by mouth every evening.    10/25/2016 at Unknown time  . nitroGLYCERIN (NITROSTAT) 0.4 MG SL tablet Place 1 tablet (0.4 mg total) under the tongue every 5 (five) minutes as needed for chest pain. 25 tablet 3 unknown  . pantoprazole (PROTONIX)  40 MG tablet Take 40 mg by mouth daily.    10/26/2016 at Unknown time  . pioglitazone (ACTOS) 15 MG tablet Take 15 mg by mouth daily.    10/26/2016 at Unknown time  . rosuvastatin (CRESTOR) 40 MG tablet Take 40 mg by mouth every evening.    10/25/2016 at Unknown time  . sitaGLIPtan-metformin (JANUMET) 50-1000 MG per tablet Take 1 tablet by mouth 2 (two) times daily with a meal.   10/26/2016 at Unknown time   Scheduled: . albuterol  2.5 mg Nebulization Q4H  . aspirin EC  81 mg Oral Daily  . azithromycin  500 mg Intravenous Q24H  . cefTRIAXone (ROCEPHIN)  IV  1 g Intravenous Q24H  . chlorhexidine  15 mL Mouth Rinse BID  . dorzolamide-timolol  1 drop Both Eyes BID  . enoxaparin (LOVENOX) injection  40 mg Subcutaneous Q24H  . fenofibrate  160 mg Oral Daily  . gabapentin  100 mg Oral TID  . insulin aspart  0-15 Units Subcutaneous TID WC  . latanoprost  1 drop Both Eyes  QHS  . mouth rinse  15 mL Mouth Rinse q12n4p  . metFORMIN  1,000 mg Oral BID WC  . methylPREDNISolone (SOLU-MEDROL) injection  40 mg Intravenous Q12H  . Milnacipran  50 mg Oral BID  . pantoprazole  40 mg Oral Daily  . polyethylene glycol  17 g Oral Daily  . potassium chloride  20 mEq Oral Daily  . rosuvastatin  40 mg Oral QPM  . senna  1 tablet Oral QHS   Continuous:  RAX:[id number]  Assesment: She was admitted with acute on chronic hypercapnic/hypoxic respiratory failure with community-acquired pneumonia and COPD exacerbation. Clinically she has improved. She is using BiPAP at night. She is mentating well despite her elevated PCO2. She has acute on chronic diastolic heart failure as well as that seems better. Principal Problem:   CAP (community acquired pneumonia) Active Problems:   Difficulty walking   Coronary atherosclerosis of native coronary artery   Acute respiratory failure with hypercapnia (HCC)   Acute diastolic heart failure (HCC)   Normocytic anemia   Diabetes mellitus with neuropathy (HCC)   COPD with exacerbation ([place])    Plan: Continue treatments. Continue BiPAP at night for now.    LOS: 4 days   Jaslyne Beeck L 10/30/2016, 7:56 AM

## 2016-10-30 NOTE — Care Management Note (Signed)
Case Management Note  Patient Details  Name: [name] MRN: [id number] Date of Birth: May 30, 1933  Subjective/Objective:                  Pt admitted with CAP. She is from home, lives alone and has strong family support. She has walker to use needed. She does not have supplemental oxygen or neb machine at home. She has PCP, transportation to appointments and no difficulty affording medications. She plans to return home at DC. PT has recommended HH PT and pt is agreeable. She has chosen [facility] from list of [facility] providers. Romualdo Bolk, of [facility], is aware of referral and will obtain pt info from chart. Pt may need BIPAP after DC. Pt referred for sleep study after DC but understands insurance will not pay for BIPAP until sleep study is completed and she will not be eligible to DC home with BIPAP. Pt will need to wean from oxygen or have home O2 assessment.   Action/Plan: Will cont to follow. Will need [place] services and ? Supplemental oxygen.   Expected Discharge Date:    11/05/2016               Expected Discharge Plan:  [place]  In-House Referral:  NA  Discharge planning Services  CM Consult  Post Acute Care Choice:  Home Health Choice offered to:  Patient  HH Arranged:  PT, RN [facility] Agency:  [facility]  Status of Service:  In process, will continue to follow   Sherald Barge, RN 10/30/2016, 1:51 PM

## 2016-10-30 NOTE — Progress Notes (Signed)
PROGRESS NOTE    [name]  [id number] DOB: 1933-04-08 DOA: 10/26/2016 PCP: Wende Neighbors, MD   Brief Narrative:  [name] is a 82 y.o. female with a history of hyperlipidemia, coronary artery disease status post multivessel CABG, depression, HTN, DM-2 who presented to the ED on 10/26/16 with a chief complaint of productive cough with green sputum and shortness of breath. Prior to admission, she had an outpatient chest x-ray ordered by her PCP which revealed right middle lobe pneumonia. She was advised to come to the ED. She was admitted for further evaluation and management. On 10/27/16, the patient developed respiratory distress. Her ABG revealed respiratory acidosis and hypercapnia. She was empirically given Lasix and placed on BiPAP and transferred to SDU. Echo revealed grade 2 diastolic dysfunction and an EF of 60%, but clinically there is little decompensated heart failure. COPD exacerbation is the likely diagnosis, along with CAP. Pulmonology consulted.    Assessment & Plan:   Principal Problem:   CAP (community acquired pneumonia) Active Problems:   Acute respiratory failure with hypercapnia (HCC)   Acute diastolic heart failure (HCC)   COPD with exacerbation (HCC)   Difficulty walking   Coronary atherosclerosis of native coronary artery   Normocytic anemia   Diabetes mellitus with neuropathy ([place])    1. Community-acquired pneumonia. Patient was afebrile and her white blood cell count was within normal limits on admission. Her chest x-ray revealed RML pneumonia. She was started on Rocephin and azithromycin. She remains afebrile. Her HIV was nonreactive. Strep pneumo and legionella antigens were negative. Blood cultures are negative to date. Influenza panel was negative. -Continue current management.  Acute respiratory failure with hypercapnia. On 11/11, the patient developed respiratory distress. Her ABG revealed a pH of 7.2 and PCO2 of 86. She was started on  BiPAP. Follow-up ABG reveal worsening PCO2 of 96. She was given 40 mg of Lasix empirically. Follow-up chest x-ray 11/11 revealed interval improvement in aeration of the right middle lobe and chronic interstitial coarsening with emphysema. -Patient appears to be breathing better on nasal cannula oxygen, but still has hypercapnia. -Pulmonology consulted; Dr. Luan Pulling recommendations noted and appreciated.  -Patient may need BiPAP for home.  COPD with exacerbation. Patient's chest x-ray is consistent with emphysema and she has had a long history of smoking, she quit greater than 20 years ago. -Dr. Luan Pulling consulted and increased albuterol nebulizers to every 4 hours and started systemic steroids. Atrovent relatively contraindicated due to the patient's glaucoma. -We'll add Dulera inhaler.  -We'll continue BiPAP when necessary for hypercapnia, ? need for home BiPAP. -We'll check another ABG tomorrow.  Mild acute diastolic CHF. Patient's  chest x-ray 11/11 revealed pulmonary vascular congestion without overt pulmonary edema. It was reported that she had mild peripheral edema following admission. 40 mg IV Lasix was given with improvement. Her echo revealed an EF of 60-65% and grade 2 diastolic dysfunction. -We'll hold off on further Lasix as she has no peripheral edema and no obvious pulmonary edema.  CAD/hyperlipidemia. Patient was continued on her statin and aspirin. Currently stable.  Type 2 diabetes with neuropathy.  Patient is treated chronically with Janumet and Actos; gabapentin for neuropathy. -She was started on sliding scale NovoLog and her oral agents were held.  Metformin part of Janumet was restarted. Her CBGs have improved, but will add Lantus due to systemic steroid treatment. -Hemoglobin A1c is pending.  Normocytic anemia. Patient was continued on multivitamin with iron and Protonix. -A few anemia studies ordered for evaluation.  Her TSH was within normal limits.  Difficulty with  ambulation. This is long-standing and may be associated with peripheral neuropathy or DJD, exacerbated by infection and respiratory failure. -PT consult ordered.     DVT prophylaxis: Lovenox Code Status: Full code Family Communication: Discussed with grand-daughter Disposition Plan: Discharge when clinically appropriate   Consultants:   Pulmonology  Procedures:  2-D echocardiogram 10/28/16: Study Conclusions - Left ventricle: The cavity size was normal. Wall thickness was   increased in a pattern of mild LVH. Systolic function was normal.   The estimated ejection fraction was in the range of 60% to 65%.   Wall motion was normal; there were no regional wall motion   abnormalities. Features are consistent with a pseudonormal left   ventricular filling pattern, with concomitant abnormal relaxation   and increased filling pressure (grade 2 diastolic dysfunction). - Atrial septum: There was increased thickness of the septum,   consistent with lipomatous hypertrophy.  - Pulmonary arteries: PA peak pressure: 35 mm Hg (S).  Antimicrobials:   Azithromycin 10/26/16>>  Rocephin 10/26/16>>   Subjective:  Patient denies chest pain or shortness of breath at rest.  Objective: Vitals:   10/30/16 0400 10/30/16 0500 10/30/16 0809 10/30/16 0823  BP:      Pulse:      Resp:      Temp: 97 F (36.1 C)  97.8 F (36.6 C)   TempSrc: Axillary  Oral   SpO2:    99%  Weight:  78.3 kg (172 lb 9.9 oz)    Height:      On telemetry-blood pressure 129/82. Heart rate 106.  Intake/Output Summary (Last 24 hours) at 10/30/16 1009 Last data filed at 10/30/16 0933  Gross per 24 hour  Intake              840 ml  Output              950 ml  Net             -110 ml   Filed Weights   10/28/16 0500 10/29/16 0500 10/30/16 0500  Weight: 78 kg (171 lb 15.3 oz) 78 kg (171 lb 15.3 oz) 78.3 kg (172 lb 9.9 oz)    Examination:  General exam: Appears calm and comfortable  Respiratory system:  Scattered wheezes. Respiratory effort normal. Cardiovascular system: S1, S2, with a soft systolic murmur. No pedal edema. Gastrointestinal system: Abdomen is nondistended, soft and nontender. No organomegaly or masses felt. Normal bowel sounds heard. Central nervous system: Alert and oriented. No focal neurological deficits. Extremities: Symmetric 5 x 5 power. Skin: No rashes, lesions or ulcers Psychiatry: Judgement and insight appear normal. Mood & affect appropriate.     Data Reviewed: I have personally reviewed following labs and imaging studies  CBC:  Recent Labs Lab 10/26/16 1717 10/27/16 0706 10/28/16 0516  WBC 9.9 9.2 10.0  NEUTROABS 6.0  --   --   HGB 10.0* 9.6* 9.1*  HCT 36.6 36.2 34.2*  MCV 80.6 82.1 82.6  PLT 441* 408* [id number]   Basic Metabolic Panel:  Recent Labs Lab 10/26/16 1717 10/30/16 0444  NA 134* 135  K 4.3 4.3  CL 97* 89*  CO2 30 37*  GLUCOSE 131* 189*  BUN 18 35*  CREATININE 0.64 0.65  CALCIUM 9.1 9.3   GFR: Estimated Creatinine Clearance: 56.2 mL/min (by C-G formula based on SCr of 0.65 mg/dL). Liver Function Tests: No results for input(s): AST, ALT, ALKPHOS, BILITOT, PROT, ALBUMIN in the last 168  hours. No results for input(s): LIPASE, AMYLASE in the last 168 hours. No results for input(s): AMMONIA in the last 168 hours. Coagulation Profile: No results for input(s): INR, PROTIME in the last 168 hours. Cardiac Enzymes: No results for input(s): CKTOTAL, CKMB, CKMBINDEX, TROPONINI in the last 168 hours. BNP (last 3 results) No results for input(s): PROBNP in the last 8760 hours. HbA1C: No results for input(s): HGBA1C in the last 72 hours. CBG:  Recent Labs Lab 10/29/16 0749 10/29/16 1146 10/29/16 1642 10/29/16 2149 10/30/16 0739  GLUCAP 130* 221* 237* 144* 152*   Lipid Profile: No results for input(s): CHOL, HDL, LDLCALC, TRIG, CHOLHDL, LDLDIRECT in the last 72 hours. Thyroid Function Tests:  Recent Labs  10/30/16 0444  TSH  0.712   Anemia Panel: No results for input(s): VITAMINB12, FOLATE, FERRITIN, TIBC, IRON, RETICCTPCT in the last 72 hours. Sepsis Labs:  Recent Labs Lab 10/28/16 0516 10/29/16 0450  PROCALCITON <0.10 <0.10    Recent Results (from the past 240 hour(s))  Blood culture (routine x 2)     Status: None (Preliminary result)   Collection Time: 10/26/16  6:43 PM  Result Value Ref Range Status   Specimen Description BLOOD LEFT ARM  Final   Special Requests BOTTLES DRAWN AEROBIC AND ANAEROBIC [place]  Final   Culture NO GROWTH 4 DAYS  Final   Report Status PENDING  Incomplete  Blood culture (routine x 2)     Status: None (Preliminary result)   Collection Time: 10/26/16  6:50 PM  Result Value Ref Range Status   Specimen Description BLOOD LEFT HAND  Final   Special Requests BOTTLES DRAWN AEROBIC AND ANAEROBIC [place]  Final   Culture NO GROWTH 4 DAYS  Final   Report Status PENDING  Incomplete  Culture, sputum-assessment     Status: None   Collection Time: 10/27/16  6:05 AM  Result Value Ref Range Status   Specimen Description SPUTUM  Final   Special Requests NONE  Final   Sputum evaluation   Final    MICROSCOPIC FINDINGS SUGGEST THAT THIS SPECIMEN IS NOT REPRESENTATIVE OF LOWER RESPIRATORY SECRETIONS. PLEASE RECOLLECT. NOTIFIED R.CHAPELLE AT 1040A ON [id number] BY THOMPSON S.    Report Status 10/27/2016 FINAL  Final  Culture, Urine     Status: None   Collection Time: 10/27/16  6:17 PM  Result Value Ref Range Status   Specimen Description URINE, CLEAN CATCH  Final   Special Requests NONE  Final   Culture NO GROWTH Performed at [facility]   Final   Report Status 10/30/2016 FINAL  Final  Culture, respiratory (NON-Expectorated)     Status: None (Preliminary result)   Collection Time: 10/27/16  7:10 PM  Result Value Ref Range Status   Specimen Description SPU  Final   Special Requests NONE  Final   Gram Stain   Final    RARE WBC PRESENT, PREDOMINANTLY PMN FEW SQUAMOUS  EPITHELIAL CELLS PRESENT ABUNDANT YEAST FEW GRAM NEGATIVE RODS RARE GRAM POSITIVE COCCI IN PAIRS RARE GRAM VARIABLE ROD    Culture [facility] Performed at [facility]   Final   Report Status PENDING  Incomplete  MRSA PCR Screening     Status: None   Collection Time: 10/27/16  9:48 PM  Result Value Ref Range Status   MRSA by PCR NEGATIVE NEGATIVE Final    Comment:        The GeneXpert MRSA Assay (FDA approved for NASAL specimens only), is one component of a comprehensive  MRSA colonization surveillance program. It is not intended to diagnose MRSA infection nor to guide or monitor treatment for MRSA infections.          Radiology Studies: No results found.      Scheduled Meds: . albuterol  2.5 mg Nebulization Q4H  . aspirin EC  81 mg Oral Daily  . azithromycin  500 mg Intravenous Q24H  . cefTRIAXone (ROCEPHIN)  IV  1 g Intravenous Q24H  . chlorhexidine  15 mL Mouth Rinse BID  . dorzolamide-timolol  1 drop Both Eyes BID  . enoxaparin (LOVENOX) injection  40 mg Subcutaneous Q24H  . fenofibrate  160 mg Oral Daily  . gabapentin  100 mg Oral TID  . insulin aspart  0-15 Units Subcutaneous TID WC  . latanoprost  1 drop Both Eyes QHS  . mouth rinse  15 mL Mouth Rinse q12n4p  . metFORMIN  1,000 mg Oral BID WC  . methylPREDNISolone (SOLU-MEDROL) injection  40 mg Intravenous Q12H  . Milnacipran  50 mg Oral BID  . pantoprazole  40 mg Oral Daily  . polyethylene glycol  17 g Oral Daily  . potassium chloride  20 mEq Oral Daily  . rosuvastatin  40 mg Oral QPM  . senna  1 tablet Oral QHS   Continuous Infusions:   LOS: 4 days    Time spent: 53 minutes    Rexene Alberts, MD [facility] Pager [phone]   If 7PM-7AM, please contact night-coverage [email or web address] Password [email or web address] 10/30/2016, 10:09 AM

## 2016-10-31 DIAGNOSIS — Z794 Long term (current) use of insulin: Secondary | ICD-10-CM

## 2016-10-31 DIAGNOSIS — J181 Lobar pneumonia, unspecified organism: Secondary | ICD-10-CM

## 2016-10-31 DIAGNOSIS — E1140 Type 2 diabetes mellitus with diabetic neuropathy, unspecified: Secondary | ICD-10-CM

## 2016-10-31 DIAGNOSIS — J441 Chronic obstructive pulmonary disease with (acute) exacerbation: Secondary | ICD-10-CM

## 2016-10-31 DIAGNOSIS — J9602 Acute respiratory failure with hypercapnia: Secondary | ICD-10-CM

## 2016-10-31 DIAGNOSIS — I5031 Acute diastolic (congestive) heart failure: Secondary | ICD-10-CM

## 2016-10-31 LAB — GLUCOSE, CAPILLARY
GLUCOSE-CAPILLARY: 141 mg/dL — AB (ref 65–99)
Glucose-Capillary: 136 mg/dL — ABNORMAL HIGH (ref 65–99)
Glucose-Capillary: 159 mg/dL — ABNORMAL HIGH (ref 65–99)
Glucose-Capillary: 212 mg/dL — ABNORMAL HIGH (ref 65–99)

## 2016-10-31 LAB — BLOOD GAS, ARTERIAL
Acid-Base Excess: 11.8 mmol/L — ABNORMAL HIGH (ref 0.0–2.0)
BICARBONATE: 33.5 mmol/L — AB (ref 20.0–28.0)
Drawn by: 21310
O2 Content: 3.5 L/min
O2 Saturation: 93.3 %
PATIENT TEMPERATURE: 37
PO2 ART: 75.5 mmHg — AB (ref 83.0–108.0)
pCO2 arterial: 65.4 mmHg (ref 32.0–48.0)
pH, Arterial: 7.376 (ref 7.350–7.450)

## 2016-10-31 LAB — CULTURE, BLOOD (ROUTINE X 2)
CULTURE: NO GROWTH
Culture: NO GROWTH

## 2016-10-31 LAB — BASIC METABOLIC PANEL
Anion gap: 9 (ref 5–15)
BUN: 30 mg/dL — AB (ref 6–20)
CHLORIDE: 94 mmol/L — AB (ref 101–111)
CO2: 32 mmol/L (ref 22–32)
Calcium: 9.6 mg/dL (ref 8.9–10.3)
Creatinine, Ser: 0.61 mg/dL (ref 0.44–1.00)
GFR calc Af Amer: >60 mL/min (ref >60)
GFR calc non Af Amer: >60 mL/min (ref >60)
Glucose, Bld: 213 mg/dL — ABNORMAL HIGH (ref 65–99)
POTASSIUM: 4.7 mmol/L (ref 3.5–5.1)
Sodium: 135 mmol/L (ref 135–145)

## 2016-10-31 LAB — HEMOGLOBIN A1C
HEMOGLOBIN A1C: 8.1 % — AB (ref 4.8–5.6)
MEAN PLASMA GLUCOSE: 186 mg/dL

## 2016-10-31 LAB — PROCALCITONIN: Procalcitonin: <0.1 ng/mL

## 2016-10-31 MED ORDER — ALBUTEROL SULFATE (2.5 MG/3ML) 0.083% IN NEBU: 2.5000 mg | INHALATION_SOLUTION | RESPIRATORY_TRACT | Status: DC | PRN

## 2016-10-31 MED ORDER — ALBUTEROL SULFATE (2.5 MG/3ML) 0.083% IN NEBU
2.5000 mg | INHALATION_SOLUTION | Freq: Three times a day (TID) | RESPIRATORY_TRACT | Status: DC
Start: 2016-10-31 — End: 2016-11-01
  Administered 2016-10-31 – 2016-11-01 (×3): 2.5 mg via RESPIRATORY_TRACT
  Filled 2016-10-31 (×3): qty 3

## 2016-10-31 MED ORDER — MAGNESIUM HYDROXIDE 400 MG/5ML PO SUSP
30.0000 mL | Freq: Every day | ORAL | Status: DC | PRN
  Administered 2016-10-31 – 2016-11-02 (×2): 30 mL via ORAL
  Filled 2016-10-31 (×2): qty 30

## 2016-10-31 MED ORDER — ACETAMINOPHEN 325 MG PO TABS
650.0000 mg | ORAL_TABLET | ORAL | Status: DC | PRN
  Administered 2016-11-01 – 2016-11-03 (×4): 650 mg via ORAL
  Filled 2016-10-31 (×4): qty 2

## 2016-10-31 MED ORDER — FUROSEMIDE 10 MG/ML IJ SOLN
40.0000 mg | Freq: Once | INTRAMUSCULAR | Status: AC
  Administered 2016-10-31: 40 mg via INTRAVENOUS
  Filled 2016-10-31: qty 4

## 2016-10-31 NOTE — Progress Notes (Signed)
PROGRESS NOTE    [name]  [id number] DOB: 02-Dec-1933 DOA: 10/26/2016 PCP: Wende Neighbors, MD    Brief Narrative:  65 yof with a hx of HLD, CAD s/p CABG, depression, HTN, and type 2 DM, presented with complaints of a productive cough with green sputum and shortness of breath. Prior to admission, she had an outpatient chest x-ray ordered by her PCP which revealed right middle lobe pneumonia, in which she was advised to come to the ED. While in the ED, serology was unremarkable except for hemoglobin 10.0. On 11/11 she developed respiratory distress with hypercapnia. She was empirically given Lasix and placed on BiPAP and transferred to SDU. Her echocardiogram revealed an EF of 60% with grade 2 diastolic dysfunction. She was noted to be in COPD exacerbation secondary to pneumonia, in which pulmonology was consulted.   Assessment & Plan:   Principal Problem:   CAP (community acquired pneumonia) Active Problems:   Difficulty walking   Coronary atherosclerosis of native coronary artery   Acute respiratory failure with hypercapnia (HCC)   Acute diastolic heart failure (HCC)   Normocytic anemia   Diabetes mellitus with neuropathy (HCC)   COPD with exacerbation ([place])  1. CAP. Patient was afebrile with a normal WBC on admission. Her chest x-ray revealed pneumonia and she was started on Rocephin and azithromycin. She remains afebrile. Her HIV was nonreactive. Strep pneumo and legionella antigens were negative. Blood cultures are negative to date. Influenza panel was negative. Continue current management with empiric antibiotics.  2. Acute respiratory failure with hypoxia, hypercapnia. On 11/11, the patient developed respiratory distress. She was given 40 mg of Lasix empirically. Follow-up chest x-ray 11/11 revealed interval improvement in aeration of the right middle lobe and chronic interstitial coarsening with emphysema. She is improving with nasal cannula, but still has hypercapnia.  Pulmonology was consulted and feels that she may need to be on Bipap QHS after discharge.  3. COPD exacerbation. Patient's chest x-ray is consistent with emphysema and she has had a long history of smoking, in which she quit greater than 20 years ago. Pulmonology was consulted and increased her nebulizer to every 4 hours and started her on steroids. Continue Dulera inhaler and BiPAP QHS. 4. Acute diastolic CHF. Her echocardiogram revealed an EF of 60-65% and grade 2 diastolic dysfunction. She has some crackles at bases on chest exam. Will give another dose of lasix today. 5. CAD. Continue on aspirin. No chest pain. 6. HLD. Continue statin.  7. Type 2 DM. She was started on sliding scale NovoLog and her oral agents were held. Metformin and Janumet was restarted, will continue. Continue lantus.  8. Normocytic anemia. No signs of bleeding. Continue to monitor.  9. Ambulatory difficulty. This is long-standing and may be associated with peripheral neuropathy or DJD, exacerbated by infection and respiratory failure. PT was consulted and recommended home health PT.    DVT prophylaxis: Lovenox  Code Status: FULL  Family Communication: grand daughter at bedside Disposition Plan: Discharge home once improved.    Consultants:   Pulmonology   PT   Procedures:   None   Antimicrobials:   Azithromycin 10/26/16>>  Rocephin 10/26/16>>   Subjective: Feeling better. Shortness of breath improving. No cough  Objective: Vitals:   10/31/16 0300 10/31/16 0400 10/31/16 0500 10/31/16 0600  BP: (!) 83/43 (!) 94/49 114/63 140/83  Pulse: 87 88 89 93  Resp: 20 20 (!) 22 18  Temp:  97.6 F (36.4 C)    TempSrc:  Oral  SpO2: 99% 98% 100% 98%  Weight:   77.3 kg (170 lb 6.7 oz)   Height:        Intake/Output Summary (Last 24 hours) at 10/31/16 0729 Last data filed at 10/31/16 0500  Gross per 24 hour  Intake              840 ml  Output             2525 ml  Net            -1685 ml   Filed  Weights   10/29/16 0500 10/30/16 0500 10/31/16 0500  Weight: 78 kg (171 lb 15.3 oz) 78.3 kg (172 lb 9.9 oz) 77.3 kg (170 lb 6.7 oz)    Examination:  General exam: Appears calm and comfortable  Respiratory system: crackles at bases. Respiratory effort normal. Cardiovascular system: S1 & S2 heard, RRR. No JVD, murmurs, rubs, gallops or clicks. No pedal edema. Gastrointestinal system: Abdomen is nondistended, soft and nontender. No organomegaly or masses felt. Normal bowel sounds heard. Central nervous system: Alert and oriented. No focal neurological deficits. Extremities: Symmetric 5 x 5 power. Skin: No rashes, lesions or ulcers Psychiatry: Judgement and insight appear normal. Mood & affect appropriate.     Data Reviewed: I have personally reviewed following labs and imaging studies  CBC:  Recent Labs Lab 10/26/16 1717 10/27/16 0706 10/28/16 0516  WBC 9.9 9.2 10.0  NEUTROABS 6.0  --   --   HGB 10.0* 9.6* 9.1*  HCT 36.6 36.2 34.2*  MCV 80.6 82.1 82.6  PLT 441* 408* [id number]   Basic Metabolic Panel:  Recent Labs Lab 10/26/16 1717 10/30/16 0444 10/31/16 0445  NA 134* 135 135  K 4.3 4.3 4.7  CL 97* 89* 94*  CO2 30 37* 32  GLUCOSE 131* 189* 213*  BUN 18 35* 30*  CREATININE 0.64 0.65 0.61  CALCIUM 9.1 9.3 9.6   GFR: Estimated Creatinine Clearance: 55.9 mL/min (by C-G formula based on SCr of 0.61 mg/dL). Liver Function Tests: No results for input(s): AST, ALT, ALKPHOS, BILITOT, PROT, ALBUMIN in the last 168 hours. No results for input(s): LIPASE, AMYLASE in the last 168 hours. No results for input(s): AMMONIA in the last 168 hours. Coagulation Profile: No results for input(s): INR, PROTIME in the last 168 hours. Cardiac Enzymes: No results for input(s): CKTOTAL, CKMB, CKMBINDEX, TROPONINI in the last 168 hours. BNP (last 3 results) No results for input(s): PROBNP in the last 8760 hours. HbA1C:  Recent Labs  10/30/16 0444  HGBA1C 8.1*   CBG:  Recent Labs Lab  10/29/16 2149 10/30/16 0739 10/30/16 1134 10/30/16 1651 10/30/16 2142  GLUCAP 144* 152* 208* 208* 154*   Lipid Profile: No results for input(s): CHOL, HDL, LDLCALC, TRIG, CHOLHDL, LDLDIRECT in the last 72 hours. Thyroid Function Tests:  Recent Labs  10/30/16 0444  TSH 0.712   Anemia Panel:  Recent Labs  10/30/16 0444  VITAMINB12 165*  FERRITIN 8*  TIBC 735*  IRON 16*   Sepsis Labs:  Recent Labs Lab 10/28/16 0516 10/29/16 0450 10/31/16 0445  PROCALCITON <0.10 <0.10 <0.10    Recent Results (from the past 240 hour(s))  Blood culture (routine x 2)     Status: None (Preliminary result)   Collection Time: 10/26/16  6:43 PM  Result Value Ref Range Status   Specimen Description BLOOD LEFT ARM  Final   Special Requests BOTTLES DRAWN AEROBIC AND ANAEROBIC 6CC EACH  Final   Culture NO GROWTH 4 DAYS  Final   Report Status PENDING  Incomplete  Blood culture (routine x 2)     Status: None (Preliminary result)   Collection Time: 10/26/16  6:50 PM  Result Value Ref Range Status   Specimen Description BLOOD LEFT HAND  Final   Special Requests BOTTLES DRAWN AEROBIC AND ANAEROBIC 6CC EACH  Final   Culture NO GROWTH 4 DAYS  Final   Report Status PENDING  Incomplete  Culture, sputum-assessment     Status: None   Collection Time: 10/27/16  6:05 AM  Result Value Ref Range Status   Specimen Description SPUTUM  Final   Special Requests NONE  Final   Sputum evaluation   Final    MICROSCOPIC FINDINGS SUGGEST THAT THIS SPECIMEN IS NOT REPRESENTATIVE OF LOWER RESPIRATORY SECRETIONS. PLEASE RECOLLECT. NOTIFIED R.CHAPELLE AT 1040A ON [id number] BY THOMPSON S.    Report Status 10/27/2016 FINAL  Final  Culture, Urine     Status: None   Collection Time: 10/27/16  6:17 PM  Result Value Ref Range Status   Specimen Description URINE, CLEAN CATCH  Final   Special Requests NONE  Final   Culture NO GROWTH Performed at [facility]   Final   Report Status 10/30/2016 FINAL  Final    Culture, respiratory (NON-Expectorated)     Status: None   Collection Time: 10/27/16  7:10 PM  Result Value Ref Range Status   Specimen Description SPU  Final   Special Requests NONE  Final   Gram Stain   Final    RARE WBC PRESENT, PREDOMINANTLY PMN FEW SQUAMOUS EPITHELIAL CELLS PRESENT ABUNDANT YEAST FEW GRAM NEGATIVE RODS RARE GRAM POSITIVE COCCI IN PAIRS RARE GRAM VARIABLE ROD Performed at [facility]    Culture MULTIPLE ORGANISMS PRESENT, NONE PREDOMINANT  Final   Report Status 10/30/2016 FINAL  Final  MRSA PCR Screening     Status: None   Collection Time: 10/27/16  9:48 PM  Result Value Ref Range Status   MRSA by PCR NEGATIVE NEGATIVE Final    Comment:        The GeneXpert MRSA Assay (FDA approved for NASAL specimens only), is one component of a comprehensive MRSA colonization surveillance program. It is not intended to diagnose MRSA infection nor to guide or monitor treatment for MRSA infections.          Radiology Studies: No results found.      Scheduled Meds: . albuterol  2.5 mg Nebulization Q4H [place]  . aspirin EC  81 mg Oral Daily  . azithromycin  500 mg Oral q1800  . cefTRIAXone (ROCEPHIN)  IV  1 g Intravenous Q24H  . chlorhexidine  15 mL Mouth Rinse BID  . dorzolamide-timolol  1 drop Both Eyes BID  . enoxaparin (LOVENOX) injection  40 mg Subcutaneous Q24H  . fenofibrate  160 mg Oral Daily  . gabapentin  100 mg Oral TID  . insulin aspart  0-15 Units Subcutaneous TID WC  . insulin glargine  10 Units Subcutaneous QHS  . latanoprost  1 drop Both Eyes QHS  . mouth rinse  15 mL Mouth Rinse q12n4p  . metFORMIN  1,000 mg Oral BID WC  . methylPREDNISolone (SOLU-MEDROL) injection  40 mg Intravenous Q12H  . Milnacipran  50 mg Oral BID  . mometasone-formoterol  2 puff Inhalation BID  . pantoprazole  40 mg Oral Daily  . polyethylene glycol  17 g Oral Daily  . rosuvastatin  40 mg Oral QPM  . senna  1 tablet Oral  QHS   Continuous Infusions:    LOS: 5 days    Time spent: 25 minutes     Kathie Dike, MD [facility] If 7PM-7AM, please contact night-coverage [email or web address] Password [email or web address] 10/31/2016, 7:29 AM

## 2016-10-31 NOTE — Progress Notes (Signed)
Patient a/o.vss. IV patent. No complaints of any distress. Report given to  [facility], Therapist, sports. Pt to be transferred to room 327 via wheelchair with nursing staff.

## 2016-11-01 LAB — CBC
HCT: 37.1 % (ref 36.0–46.0)
HEMOGLOBIN: 10.2 g/dL — AB (ref 12.0–15.0)
MCH: 21.8 pg — AB (ref 26.0–34.0)
MCHC: 27.5 g/dL — ABNORMAL LOW (ref 30.0–36.0)
MCV: 79.4 fL (ref 78.0–100.0)
Platelets: 440 10*3/uL — ABNORMAL HIGH (ref 150–400)
RBC: 4.67 MIL/uL (ref 3.87–5.11)
RDW: 18.4 % — ABNORMAL HIGH (ref 11.5–15.5)
WBC: 9 10*3/uL (ref 4.0–10.5)

## 2016-11-01 LAB — GLUCOSE, CAPILLARY
GLUCOSE-CAPILLARY: 193 mg/dL — AB (ref 65–99)
GLUCOSE-CAPILLARY: 220 mg/dL — AB (ref 65–99)
GLUCOSE-CAPILLARY: 268 mg/dL — AB (ref 65–99)
GLUCOSE-CAPILLARY: 256 mg/dL — AB (ref 65–99)

## 2016-11-01 LAB — BASIC METABOLIC PANEL
ANION GAP: 11 (ref 5–15)
BUN: 33 mg/dL — ABNORMAL HIGH (ref 6–20)
CALCIUM: 10.2 mg/dL (ref 8.9–10.3)
CO2: 34 mmol/L — AB (ref 22–32)
Chloride: 89 mmol/L — ABNORMAL LOW (ref 101–111)
Creatinine, Ser: 0.68 mg/dL (ref 0.44–1.00)
GFR calc non Af Amer: >60 mL/min (ref >60)
Glucose, Bld: 217 mg/dL — ABNORMAL HIGH (ref 65–99)
Potassium: 4.5 mmol/L (ref 3.5–5.1)
Sodium: 134 mmol/L — ABNORMAL LOW (ref 135–145)

## 2016-11-01 LAB — BLOOD GAS, ARTERIAL
ACID-BASE EXCESS: 8 mmol/L — AB (ref 0.0–2.0)
BICARBONATE: 30.9 mmol/L — AB (ref 20.0–28.0)
DRAWN BY: 234301
O2 CONTENT: 2 L/min
O2 SAT: 89.8 %
PH ART: 7.39 (ref 7.350–7.450)
pCO2 arterial: 55.7 mmHg — ABNORMAL HIGH (ref 32.0–48.0)
pO2, Arterial: 63.1 mmHg — ABNORMAL LOW (ref 83.0–108.0)

## 2016-11-01 LAB — INFLUENZA VIRUS AG, A+B (DFA)

## 2016-11-01 NOTE — Progress Notes (Signed)
PROGRESS NOTE    [name]  [id number] DOB: 1933-11-07 DOA: 10/26/2016 PCP: Wende Neighbors, MD    Brief Narrative:  67 yof with a hx of HLD, CAD s/p CABG, depression, HTN, and type 2 DM, presented with complaints of a productive cough with green sputum and shortness of breath. Prior to admission, she had an outpatient chest x-ray ordered by her PCP which revealed right middle lobe pneumonia, in which she was advised to come to the ED. While in the ED, serology was unremarkable except for hemoglobin 10.0. On 11/11 she developed respiratory distress with hypercapnia. She was empirically given Lasix and placed on BiPAP and transferred to SDU. Her echocardiogram revealed an EF of 60% with grade 2 diastolic dysfunction. She was noted to be in COPD exacerbation secondary to pneumonia, in which pulmonology was consulted  Assessment & Plan:   Principal Problem:   CAP (community acquired pneumonia) Active Problems:   Difficulty walking   Coronary atherosclerosis of native coronary artery   Acute respiratory failure with hypercapnia (HCC)   Acute diastolic heart failure ([place])   Normocytic anemia   Diabetes mellitus with neuropathy ([place])   COPD with exacerbation ([place])  1. CAP. Patient was afebrile with a normal WBC on admission. Her chest x-ray revealed pneumonia and she was started on Rocephin and azithromycin. She remains afebrile. Her HIV was nonreactive. Strep pneumoand legionella antigens werenegative. Blood cultures arenegative to date.Influenza panel was negative. Continue current management with  antibiotics.  2. Acute respiratory failure with hypoxia, hypercapnia. On 11/11, the patient developed respiratory distress. She was given 40 mg of Lasix empirically. Follow-up chest x-ray 11/11 revealed interval improvement in aeration of the right middle lobe and chronic interstitial coarsening with emphysema. She is improving with nasal cannula, but still has hypercapnia. Pulmonology was  consulted and feels that she may need to be on Bipap QHS after discharge. 3. COPD exacerbation. Patient's chest x-ray is consistent with emphysema and she has had a long history of smoking, in which she quit greater than 20 years ago. Pulmonology was consulted and increased her nebulizer to every 4 hours and started her on steroids. Continue Dulera inhaler and BiPAP QHS. 4. Acute diastolic CHF. Her echocardiogram revealed an EF of 60-65% and grade 2 diastolic dysfunction. She has had good urine output. Volume status appears greatly improved. Will hold off on further lasix. 5. CAD. Continue on aspirin. No chest pain. 6. HLD. Continue statin.  7. Type 2 DM. She was started on sliding scale NovoLog and her oral agents were held.Metformin and Janumet was restarted, will continue. Continue lantus.  8. Normocytic anemia. No signs of bleeding. Continue to monitor.  9. Ambulatory difficulty. This is long-standing and may be associated with peripheral neuropathy or DJD, exacerbated by infection and respiratory failure. PT was consulted and recommended home health PT.   DVT prophylaxis: Lovenox  Code Status: FULL  Family Communication: discussed with family bedside Disposition Plan: Discharge home once improved.    Consultants:   Pulmonology   Physical therapy   Procedures:  None   Antimicrobials:  Azithromycin 11/10>>  Rocephin 11/10 >>   Subjective: Breathing is improving. Cough is less productive  Objective: Vitals:   10/31/16 1658 10/31/16 1939 10/31/16 2110 11/01/16 0520  BP: 132/74  (!) 119/58 125/67  Pulse: 92  97 93  Resp: 18  18 18   Temp: 97.9 F (36.6 C)  97.7 F (36.5 C) 97.8 F (36.6 C)  TempSrc: Oral  Oral Oral  SpO2: 97%  96% 95% 98%  Weight:      Height:        Intake/Output Summary (Last 24 hours) at 11/01/16 0820 Last data filed at 11/01/16 0521  Gross per 24 hour  Intake              720 ml  Output             4600 ml  Net            -3880 ml   Filed  Weights   10/29/16 0500 10/30/16 0500 10/31/16 0500  Weight: 78 kg (171 lb 15.3 oz) 78.3 kg (172 lb 9.9 oz) 77.3 kg (170 lb 6.7 oz)    Examination:  General exam: Appears calm and comfortable  Respiratory system: Clear to auscultation. Respiratory effort normal. Cardiovascular system: S1 & S2 heard, RRR. No JVD, murmurs, rubs, gallops or clicks. No pedal edema. Gastrointestinal system: Abdomen is nondistended, soft and nontender. No organomegaly or masses felt. Normal bowel sounds heard. Central nervous system: Alert and oriented. No focal neurological deficits. Extremities: Symmetric 5 x 5 power. Skin: No rashes, lesions or ulcers Psychiatry: Judgement and insight appear normal. Mood & affect appropriate.     Data Reviewed: I have personally reviewed following labs and imaging studies  CBC:  Recent Labs Lab 10/26/16 1717 10/27/16 0706 10/28/16 0516 11/01/16 0635  WBC 9.9 9.2 10.0 9.0  NEUTROABS 6.0  --   --   --   HGB 10.0* 9.6* 9.1* 10.2*  HCT 36.6 36.2 34.2* 37.1  MCV 80.6 82.1 82.6 79.4  PLT 441* 408* 332 [id number]*   Basic Metabolic Panel:  Recent Labs Lab 10/26/16 1717 10/30/16 0444 10/31/16 0445 11/01/16 0635  NA 134* 135 135 134*  K 4.3 4.3 4.7 4.5  CL 97* 89* 94* 89*  CO2 30 37* 32 34*  GLUCOSE 131* 189* 213* 217*  BUN 18 35* 30* 33*  CREATININE 0.64 0.65 0.61 0.68  CALCIUM 9.1 9.3 9.6 10.2   GFR: Estimated Creatinine Clearance: 55.9 mL/min (by C-G formula based on SCr of 0.68 mg/dL). Liver Function Tests: No results for input(s): AST, ALT, ALKPHOS, BILITOT, PROT, ALBUMIN in the last 168 hours. No results for input(s): LIPASE, AMYLASE in the last 168 hours. No results for input(s): AMMONIA in the last 168 hours. Coagulation Profile: No results for input(s): INR, PROTIME in the last 168 hours. Cardiac Enzymes: No results for input(s): CKTOTAL, CKMB, CKMBINDEX, TROPONINI in the last 168 hours. BNP (last 3 results) No results for input(s): PROBNP in the  last 8760 hours. HbA1C:  Recent Labs  10/30/16 0444  HGBA1C 8.1*   CBG:  Recent Labs Lab 10/31/16 0735 10/31/16 1129 10/31/16 1624 10/31/16 2214 11/01/16 0746  GLUCAP 136* 159* 212* 141* 193*   Lipid Profile: No results for input(s): CHOL, HDL, LDLCALC, TRIG, CHOLHDL, LDLDIRECT in the last 72 hours. Thyroid Function Tests:  Recent Labs  10/30/16 0444  TSH 0.712   Anemia Panel:  Recent Labs  10/30/16 0444  VITAMINB12 165*  FERRITIN 8*  TIBC 735*  IRON 16*   Sepsis Labs:  Recent Labs Lab 10/28/16 0516 10/29/16 0450 10/31/16 0445  PROCALCITON <0.10 <0.10 <0.10    Recent Results (from the past 240 hour(s))  Blood culture (routine x 2)     Status: None   Collection Time: 10/26/16  6:43 PM  Result Value Ref Range Status   Specimen Description BLOOD LEFT ARM  Final   Special Requests BOTTLES DRAWN AEROBIC AND ANAEROBIC 6CC  EACH  Final   Culture NO GROWTH 5 DAYS  Final   Report Status 10/31/2016 FINAL  Final  Blood culture (routine x 2)     Status: None   Collection Time: 10/26/16  6:50 PM  Result Value Ref Range Status   Specimen Description BLOOD LEFT HAND  Final   Special Requests BOTTLES DRAWN AEROBIC AND ANAEROBIC [place]  Final   Culture NO GROWTH 5 DAYS  Final   Report Status 10/31/2016 FINAL  Final  Culture, sputum-assessment     Status: None   Collection Time: 10/27/16  6:05 AM  Result Value Ref Range Status   Specimen Description SPUTUM  Final   Special Requests NONE  Final   Sputum evaluation   Final    MICROSCOPIC FINDINGS SUGGEST THAT THIS SPECIMEN IS NOT REPRESENTATIVE OF LOWER RESPIRATORY SECRETIONS. PLEASE RECOLLECT. NOTIFIED R.CHAPELLE AT 1040A ON [id number] BY THOMPSON S.    Report Status 10/27/2016 FINAL  Final  Culture, Urine     Status: None   Collection Time: 10/27/16  6:17 PM  Result Value Ref Range Status   Specimen Description URINE, CLEAN CATCH  Final   Special Requests NONE  Final   Culture NO GROWTH Performed at [facility]   Final   Report Status 10/30/2016 FINAL  Final  Culture, respiratory (NON-Expectorated)     Status: None   Collection Time: 10/27/16  7:10 PM  Result Value Ref Range Status   Specimen Description SPU  Final   Special Requests NONE  Final   Gram Stain   Final    RARE WBC PRESENT, PREDOMINANTLY PMN FEW SQUAMOUS EPITHELIAL CELLS PRESENT ABUNDANT YEAST FEW GRAM NEGATIVE RODS RARE GRAM POSITIVE COCCI IN PAIRS RARE GRAM VARIABLE ROD Performed at [facility]    Culture MULTIPLE ORGANISMS PRESENT, NONE PREDOMINANT  Final   Report Status 10/30/2016 FINAL  Final  MRSA PCR Screening     Status: None   Collection Time: 10/27/16  9:48 PM  Result Value Ref Range Status   MRSA by PCR NEGATIVE NEGATIVE Final    Comment:        The GeneXpert MRSA Assay (FDA approved for NASAL specimens only), is one component of a comprehensive MRSA colonization surveillance program. It is not intended to diagnose MRSA infection nor to guide or monitor treatment for MRSA infections.          Radiology Studies: No results found.      Scheduled Meds: . albuterol  2.5 mg Nebulization TID  . aspirin EC  81 mg Oral Daily  . azithromycin  500 mg Oral q1800  . cefTRIAXone (ROCEPHIN)  IV  1 g Intravenous Q24H  . chlorhexidine  15 mL Mouth Rinse BID  . dorzolamide-timolol  1 drop Both Eyes BID  . enoxaparin (LOVENOX) injection  40 mg Subcutaneous Q24H  . fenofibrate  160 mg Oral Daily  . gabapentin  100 mg Oral TID  . insulin aspart  0-15 Units Subcutaneous TID WC  . insulin glargine  10 Units Subcutaneous QHS  . latanoprost  1 drop Both Eyes QHS  . mouth rinse  15 mL Mouth Rinse q12n4p  . metFORMIN  1,000 mg Oral BID WC  . methylPREDNISolone (SOLU-MEDROL) injection  40 mg Intravenous Q12H  . Milnacipran  50 mg Oral BID  . mometasone-formoterol  2 puff Inhalation BID  . pantoprazole  40 mg Oral Daily  . polyethylene glycol  17 g Oral Daily  . rosuvastatin  40 mg  Oral  QPM  . senna  1 tablet Oral QHS   Continuous Infusions:   LOS: 6 days    Time spent: 25 minutes     Kathie Dike, MD [facility] If 7PM-7AM, please contact night-coverage [email or web address] Password [email or web address] 11/01/2016, 8:20 AM

## 2016-11-01 NOTE — Progress Notes (Signed)
Subjective: She says she feels better. She did use BiPAP last night. Her breathing is substantially better. She has pneumonia but is not coughing very much now. No nausea vomiting diarrhea.  Objective: Vital signs in last 24 hours: Temp:  [97 F (36.1 C)-97.9 F (36.6 C)] 97.8 F (36.6 C) (11/16 0520) Pulse Rate:  [88-102] 93 (11/16 0520) Resp:  [18-26] 18 (11/16 0520) BP: (118-132)/(58-78) 125/67 (11/16 0520) SpO2:  [92 %-100 %] 98 % (11/16 0520) Weight change:  Last BM Date: 10/31/16  Intake/Output from previous day: 11/15 0701 - 11/16 0700 In: 720 [P.O.:720] Out: 4600 [Urine:4600]  PHYSICAL EXAM General appearance: alert, cooperative and no distress Resp: rhonchi bilaterally Cardio: regular rate and rhythm, S1, S2 normal, no murmur, click, rub or gallop GI: soft, non-tender; bowel sounds normal; no masses,  no organomegaly Extremities: extremities normal, atraumatic, no cyanosis or edema Skin warm and dry. Mucous membranes are moist  Lab Results:  Results for orders placed or performed during the hospital encounter of 10/26/16 (from the past 48 hour(s))  Glucose, capillary     Status: Abnormal   Collection Time: 10/30/16 11:34 AM  Result Value Ref Range   Glucose-Capillary 208 (H) 65 - 99 mg/dL   Comment 1 Notify RN    Comment 2 Document in Chart   Glucose, capillary     Status: Abnormal   Collection Time: 10/30/16  4:51 PM  Result Value Ref Range   Glucose-Capillary 208 (H) 65 - 99 mg/dL   Comment 1 Notify RN    Comment 2 Document in Chart   Glucose, capillary     Status: Abnormal   Collection Time: 10/30/16  9:42 PM  Result Value Ref Range   Glucose-Capillary 154 (H) 65 - 99 mg/dL  Procalcitonin     Status: None   Collection Time: 10/31/16  4:45 AM  Result Value Ref Range   Procalcitonin <0.10 ng/mL    Comment:        Interpretation: PCT (Procalcitonin) <= 0.5 ng/mL: Systemic infection (sepsis) is not likely. Local bacterial infection is  possible. (NOTE)         ICU PCT Algorithm               Non ICU PCT Algorithm    ----------------------------     ------------------------------         PCT < 0.25 ng/mL                 PCT < 0.1 ng/mL     Stopping of antibiotics            Stopping of antibiotics       strongly encouraged.               strongly encouraged.    ----------------------------     ------------------------------       PCT level decrease by               PCT < 0.25 ng/mL       >= 80% from peak PCT       OR PCT 0.25 - 0.5 ng/mL          Stopping of antibiotics                                             encouraged.     Stopping of antibiotics  encouraged.    ----------------------------     ------------------------------       PCT level decrease by              PCT >= 0.25 ng/mL       < 80% from peak PCT        AND PCT >= 0.5 ng/mL            Continuin g antibiotics                                              encouraged.       Continuing antibiotics            encouraged.    ----------------------------     ------------------------------     PCT level increase compared          PCT > 0.5 ng/mL         with peak PCT AND          PCT >= 0.5 ng/mL             Escalation of antibiotics                                          strongly encouraged.      Escalation of antibiotics        strongly encouraged.   Basic metabolic panel     Status: Abnormal   Collection Time: 10/31/16  4:45 AM  Result Value Ref Range   Sodium 135 135 - 145 mmol/L   Potassium 4.7 3.5 - 5.1 mmol/L   Chloride 94 (L) 101 - 111 mmol/L   CO2 32 22 - 32 mmol/L   Glucose, Bld 213 (H) 65 - 99 mg/dL   BUN 30 (H) 6 - 20 mg/dL   Creatinine, Ser 0.61 0.44 - 1.00 mg/dL   Calcium 9.6 8.9 - 10.3 mg/dL   GFR calc non Af Amer >60 >60 mL/min   GFR calc Af Amer >60 >60 mL/min    Comment: (NOTE) The eGFR has been calculated using the CKD EPI equation. This calculation has not been validated in all clinical situations. eGFR's  persistently <60 mL/min signify possible Chronic Kidney Disease.    Anion gap 9 5 - 15  Blood gas, arterial     Status: Abnormal   Collection Time: 10/31/16  5:55 AM  Result Value Ref Range   O2 Content 3.5 L/min   Delivery systems NASAL CANNULA    pH, Arterial 7.376 7.350 - 7.450   pCO2 arterial 65.4 (HH) 32.0 - 48.0 mmHg    Comment: CRITICAL RESULT CALLED TO, READ BACK BY AND VERIFIED WITH: [facility] R.RN AT 2355 10/31/16 PITTMAN S RRT    pO2, Arterial 75.5 (L) 83.0 - 108.0 mmHg   Bicarbonate 33.5 (H) 20.0 - 28.0 mmol/L   Acid-Base Excess 11.8 (H) 0.0 - 2.0 mmol/L   O2 Saturation 93.3 %   Patient temperature 37.0    Collection site LEFT RADIAL    Drawn by [id number]    Sample type ARTERIAL    Allens test (pass/fail) PASS PASS  Glucose, capillary     Status: Abnormal   Collection Time: 10/31/16  7:35 AM  Result Value Ref Range   Glucose-Capillary 136 (H) 65 - 99  mg/dL  Glucose, capillary     Status: Abnormal   Collection Time: 10/31/16 11:29 AM  Result Value Ref Range   Glucose-Capillary 159 (H) 65 - 99 mg/dL  Glucose, capillary     Status: Abnormal   Collection Time: 10/31/16  4:24 PM  Result Value Ref Range   Glucose-Capillary 212 (H) 65 - 99 mg/dL   Comment 1 Notify RN    Comment 2 Document in Chart   Glucose, capillary     Status: Abnormal   Collection Time: 10/31/16 10:14 PM  Result Value Ref Range   Glucose-Capillary 141 (H) 65 - 99 mg/dL  Basic metabolic panel     Status: Abnormal   Collection Time: 11/01/16  6:35 AM  Result Value Ref Range   Sodium 134 (L) 135 - 145 mmol/L   Potassium 4.5 3.5 - 5.1 mmol/L   Chloride 89 (L) 101 - 111 mmol/L   CO2 34 (H) 22 - 32 mmol/L   Glucose, Bld 217 (H) 65 - 99 mg/dL   BUN 33 (H) 6 - 20 mg/dL   Creatinine, Ser 0.68 0.44 - 1.00 mg/dL   Calcium 10.2 8.9 - 10.3 mg/dL   GFR calc non Af Amer >60 >60 mL/min   GFR calc Af Amer >60 >60 mL/min    Comment: (NOTE) The eGFR has been calculated using the CKD EPI equation. This  calculation has not been validated in all clinical situations. eGFR's persistently <60 mL/min signify possible Chronic Kidney Disease.    Anion gap 11 5 - 15  CBC     Status: Abnormal   Collection Time: 11/01/16  6:35 AM  Result Value Ref Range   WBC 9.0 4.0 - 10.5 K/uL   RBC 4.67 3.87 - 5.11 MIL/uL   Hemoglobin 10.2 (L) 12.0 - 15.0 g/dL   HCT 37.1 36.0 - 46.0 %   MCV 79.4 78.0 - 100.0 fL   MCH 21.8 (L) 26.0 - 34.0 pg   MCHC 27.5 (L) 30.0 - 36.0 g/dL   RDW 18.4 (H) 11.5 - 15.5 %   Platelets 440 (H) 150 - 400 K/uL  Glucose, capillary     Status: Abnormal   Collection Time: 11/01/16  7:46 AM  Result Value Ref Range   Glucose-Capillary 193 (H) 65 - 99 mg/dL    ABGS  Recent Labs  10/31/16 0555  PHART 7.376  PO2ART 75.5*  HCO3 33.5*   CULTURES Recent Results (from the past 240 hour(s))  Blood culture (routine x 2)     Status: None   Collection Time: 10/26/16  6:43 PM  Result Value Ref Range Status   Specimen Description BLOOD LEFT ARM  Final   Special Requests BOTTLES DRAWN AEROBIC AND ANAEROBIC [place]  Final   Culture NO GROWTH 5 DAYS  Final   Report Status 10/31/2016 FINAL  Final  Blood culture (routine x 2)     Status: None   Collection Time: 10/26/16  6:50 PM  Result Value Ref Range Status   Specimen Description BLOOD LEFT HAND  Final   Special Requests BOTTLES DRAWN AEROBIC AND ANAEROBIC [place]  Final   Culture NO GROWTH 5 DAYS  Final   Report Status 10/31/2016 FINAL  Final  Culture, sputum-assessment     Status: None   Collection Time: 10/27/16  6:05 AM  Result Value Ref Range Status   Specimen Description SPUTUM  Final   Special Requests NONE  Final   Sputum evaluation   Final    MICROSCOPIC  FINDINGS SUGGEST THAT THIS SPECIMEN IS NOT REPRESENTATIVE OF LOWER RESPIRATORY SECRETIONS. PLEASE RECOLLECT. NOTIFIED R.CHAPELLE AT 1040A ON [id number] BY THOMPSON S.    Report Status 10/27/2016 FINAL  Final  Culture, Urine     Status: None   Collection Time: 10/27/16   6:17 PM  Result Value Ref Range Status   Specimen Description URINE, CLEAN CATCH  Final   Special Requests NONE  Final   Culture NO GROWTH Performed at [facility]   Final   Report Status 10/30/2016 FINAL  Final  Culture, respiratory (NON-Expectorated)     Status: None   Collection Time: 10/27/16  7:10 PM  Result Value Ref Range Status   Specimen Description SPU  Final   Special Requests NONE  Final   Gram Stain   Final    RARE WBC PRESENT, PREDOMINANTLY PMN FEW SQUAMOUS EPITHELIAL CELLS PRESENT ABUNDANT YEAST FEW GRAM NEGATIVE RODS RARE GRAM POSITIVE COCCI IN PAIRS RARE GRAM VARIABLE ROD Performed at [facility]    Culture MULTIPLE ORGANISMS PRESENT, NONE PREDOMINANT  Final   Report Status 10/30/2016 FINAL  Final  MRSA PCR Screening     Status: None   Collection Time: 10/27/16  9:48 PM  Result Value Ref Range Status   MRSA by PCR NEGATIVE NEGATIVE Final    Comment:        The GeneXpert MRSA Assay (FDA approved for NASAL specimens only), is one component of a comprehensive MRSA colonization surveillance program. It is not intended to diagnose MRSA infection nor to guide or monitor treatment for MRSA infections.    Studies/Results: No results found.  Medications:  Prior to Admission:  Prescriptions Prior to Admission  Medication Sig Dispense Refill Last Dose  . aspirin EC 81 MG tablet Take 81 mg by mouth daily.   10/26/2016 at Unknown time  . Choline Fenofibrate 135 MG capsule Take 135 mg by mouth daily.   10/26/2016 at Unknown time  . dorzolamide-timolol (COSOPT) 22.3-6.8 MG/ML ophthalmic solution Place 1 drop into both eyes 2 (two) times daily.    10/26/2016 at Unknown time  . gabapentin (NEURONTIN) 100 MG capsule Take 100 mg by mouth 3 (three) times daily.    10/26/2016 at Unknown time  . latanoprost (XALATAN) 0.005 % ophthalmic solution Place 1 drop into both eyes at bedtime.   10/25/2016 at Unknown time  . magnesium hydroxide (MILK OF MAGNESIA)  400 MG/5ML suspension Take 30 mLs by mouth every other day.   Past Week at Unknown time  . Melatonin 5 MG TABS Take 5 mg by mouth at bedtime.   10/25/2016 at Unknown time  . Milnacipran (SAVELLA) 50 MG TABS Take 50 mg by mouth 2 (two) times daily.   10/26/2016 at Unknown time  . Multiple Vitamins-Minerals (MULTIVITAMIN WITH MINERALS) tablet Take 1 tablet by mouth every evening.    10/25/2016 at Unknown time  . nitroGLYCERIN (NITROSTAT) 0.4 MG SL tablet Place 1 tablet (0.4 mg total) under the tongue every 5 (five) minutes as needed for chest pain. 25 tablet 3 unknown  . pantoprazole (PROTONIX) 40 MG tablet Take 40 mg by mouth daily.    10/26/2016 at Unknown time  . pioglitazone (ACTOS) 15 MG tablet Take 15 mg by mouth daily.    10/26/2016 at Unknown time  . rosuvastatin (CRESTOR) 40 MG tablet Take 40 mg by mouth every evening.    10/25/2016 at Unknown time  . sitaGLIPtan-metformin (JANUMET) 50-1000 MG per tablet Take 1 tablet by mouth 2 (two) times  daily with a meal.   10/26/2016 at Unknown time   Scheduled: . albuterol  2.5 mg Nebulization TID  . aspirin EC  81 mg Oral Daily  . azithromycin  500 mg Oral q1800  . cefTRIAXone (ROCEPHIN)  IV  1 g Intravenous Q24H  . chlorhexidine  15 mL Mouth Rinse BID  . dorzolamide-timolol  1 drop Both Eyes BID  . enoxaparin (LOVENOX) injection  40 mg Subcutaneous Q24H  . fenofibrate  160 mg Oral Daily  . gabapentin  100 mg Oral TID  . insulin aspart  0-15 Units Subcutaneous TID WC  . insulin glargine  10 Units Subcutaneous QHS  . latanoprost  1 drop Both Eyes QHS  . mouth rinse  15 mL Mouth Rinse q12n4p  . metFORMIN  1,000 mg Oral BID WC  . methylPREDNISolone (SOLU-MEDROL) injection  40 mg Intravenous Q12H  . Milnacipran  50 mg Oral BID  . mometasone-formoterol  2 puff Inhalation BID  . pantoprazole  40 mg Oral Daily  . polyethylene glycol  17 g Oral Daily  . rosuvastatin  40 mg Oral QPM  . senna  1 tablet Oral QHS   Continuous:  [id number],  albuterol, magnesium hydroxide  Assesment: She was admitted with community-acquired pneumonia COPD exacerbation and acute diastolic heart failure. She had acute hypercapnic respiratory failure requiring BiPAP. She is substantially improved. I think she has chronic hypoxic respiratory failure at baseline but is not using oxygen at home. Principal Problem:   CAP (community acquired pneumonia) Active Problems:   Difficulty walking   Coronary atherosclerosis of native coronary artery   Acute respiratory failure with hypercapnia (HCC)   Acute diastolic heart failure (HCC)   Normocytic anemia   Diabetes mellitus with neuropathy (HCC)   COPD with exacerbation ([place])    Plan: Continue treatments. Check blood gas this morning to see where her carbon dioxide level is.    LOS: 6 days   HAWKINS,EDWARD L 11/01/2016, 8:36 AM

## 2016-11-01 NOTE — Progress Notes (Signed)
Physical Therapy Treatment Patient Details Name: [name] MRN: [id number] DOB: [birth year] Today's Date: 11/01/2016    History of Present Illness      PT Comments    Pt supine in bed with reports of "wetting bed".  Pt willing to participate with no reports of pain.  Min A following cueing for bed mobility for supine to sitting on EOB.  Pt very concerned with all movements and NGTube coming out of nose.  Pt reassured tube in place.  Due to NGTube unable to complete gait training greater than 8 feet due to concern.  Min guard with gait trianing with RW, cueing to improve posture and increase stride length to improve mechanics.  Pt left in chair with chair alarm set and daughter in room.  No reports of room.  Bedding was cleansed.     Follow Up Recommendations  Home health PT;Supervision - Intermittent     Equipment Recommendations  None recommended by PT    Recommendations for Other Services       Precautions / Restrictions      Mobility  Bed Mobility Overal bed mobility: Needs Assistance Bed Mobility: Supine to Sit     Supine to sit: Min assist;HOB elevated        Transfers Overall transfer level: Needs assistance Equipment used: Rolling walker (2 wheeled)   Sit to Stand: Min guard         General transfer comment: cueing for handplacement to assist  Ambulation/Gait     Assistive device: Rolling walker (2 wheeled)       General Gait Details: Pt very concerned with NG tubing, not willing to ambulate further due to concern   Stairs            Wheelchair Mobility    Modified Rankin (Stroke Patients Only)       Balance                                    Cognition                            Exercises      General Comments        Pertinent Vitals/Pain      Home Living                      Prior Function            PT Goals (current goals can now be found in the care plan section)  Progress towards PT goals: Progressing toward goals    Frequency    Min 3X/week      PT Plan      Co-evaluation             End of Session Equipment Utilized During Treatment: Gait belt;Oxygen (NG Tube) Activity Tolerance: Patient tolerated treatment well;Patient limited by fatigue Patient left: in chair;with call bell/phone within reach;with family/visitor present     Time: 1135-1205 PT Time Calculation (min) (ACUTE ONLY): 30 min  Charges:  $Gait Training: 8-22 mins $Therapeutic Activity: 8-22 mins                    G Codes:     Ihor Austin, LPTA; CBIS [phone]  Aldona Lento 11/01/2016, 6:17 PM

## 2016-11-02 LAB — BASIC METABOLIC PANEL
ANION GAP: 10 (ref 5–15)
BUN: 33 mg/dL — ABNORMAL HIGH (ref 6–20)
CHLORIDE: 93 mmol/L — AB (ref 101–111)
CO2: 31 mmol/L (ref 22–32)
Calcium: 10.1 mg/dL (ref 8.9–10.3)
Creatinine, Ser: 0.73 mg/dL (ref 0.44–1.00)
GFR calc non Af Amer: >60 mL/min (ref >60)
Glucose, Bld: 204 mg/dL — ABNORMAL HIGH (ref 65–99)
POTASSIUM: 4.5 mmol/L (ref 3.5–5.1)
SODIUM: 134 mmol/L — AB (ref 135–145)

## 2016-11-02 LAB — GLUCOSE, CAPILLARY
GLUCOSE-CAPILLARY: 152 mg/dL — AB (ref 65–99)
GLUCOSE-CAPILLARY: 219 mg/dL — AB (ref 65–99)
GLUCOSE-CAPILLARY: 275 mg/dL — AB (ref 65–99)
Glucose-Capillary: 174 mg/dL — ABNORMAL HIGH (ref 65–99)

## 2016-11-02 MED ORDER — PREDNISONE 20 MG PO TABS
40.0000 mg | ORAL_TABLET | Freq: Every day | ORAL | Status: DC
  Administered 2016-11-03 – 2016-11-04 (×2): 40 mg via ORAL
  Filled 2016-11-02 (×2): qty 2

## 2016-11-02 NOTE — Progress Notes (Signed)
PROGRESS NOTE    RHAELYNN BRUCH  [id number] DOB: Jun 19, 1933 DOA: 10/26/2016 PCP: Wende Neighbors, MD    Brief Narrative:  51 yof with a hx of HLD, CAD s/p CABG, depression, HTN, and type 2 DM, presented with complaints of a productive cough with green sputum and shortness of breath. Prior to admission, she had an outpatient chest x-ray ordered by her PCP which revealed right middle lobe pneumonia, in which she was advised to come to the ED. While in the ED, serology was unremarkable except for hemoglobin 10.0. On 11/11 she developed respiratory distress with hypercapnia. She was empirically given Lasix and placed on BiPAP and transferred to SDU. Her echocardiogram revealed an EF of 60% with grade 2 diastolic dysfunction. She was noted to be in COPD exacerbation secondary to pneumonia, in which pulmonology was consulted  Assessment & Plan:   Principal Problem:   CAP (community acquired pneumonia) Active Problems:   Difficulty walking   Coronary atherosclerosis of native coronary artery   Acute respiratory failure with hypercapnia (HCC)   Acute diastolic heart failure ([place])   Normocytic anemia   Diabetes mellitus with neuropathy ([place])   COPD with exacerbation ([place])  1. CAP. Patient was afebrile with a normal WBC on admission. Her chest x-ray revealed pneumonia and she was started on Rocephin and azithromycin. She remains afebrile. Her HIV was nonreactive. Strep pneumoand legionella antigens werenegative. Blood cultures arenegative to date.Influenza panel was negative. Continue current management with  antibiotics.  2. Acute respiratory failure with hypoxia, hypercapnia. On 11/11, the patient developed respiratory distress. She was given 40 mg of Lasix empirically. Follow-up chest x-ray 11/11 revealed interval improvement in aeration of the right middle lobe and chronic interstitial coarsening with emphysema. She is improving with nasal cannula and BiPAP. Pulmonology was consulted and  feels that she may need to be on Bipap QHS after discharge.Unfortunately, this cannot be arranged until she has an outpatient sleep study. We'll discontinue further BiPAP overnight and repeat ABG in the morning to monitor her PCO2. His PCO2 remained stable off of BiPAP, can possibly discharge home without BiPAP until she has her sleep study. 3. COPD exacerbation. Patient's chest x-ray is consistent with emphysema and she has had a long history of smoking, in which she quit greater than 20 years ago. Pulmonology was consulted and increased her nebulizer to every 4 hours and started her on steroids. Continue Dulera inhaler. Wheezing has now resolved. We'll start to taper steroids. 4. Acute diastolic CHF. Her echocardiogram revealed an EF of 60-65% and grade 2 diastolic dysfunction. She has had good urine output. Volume status appears greatly improved. Will hold off on further lasix. 5. CAD. Continue on aspirin. No chest pain. 6. HLD. Continue statin.  7. Type 2 DM. She was started on sliding scale NovoLog and her oral agents were held.Metformin and Janumet was restarted, will continue. Continue lantus.  8. Normocytic anemia. No signs of bleeding. Continue to monitor.  9. Ambulatory difficulty. This is long-standing and may be associated with peripheral neuropathy or DJD, exacerbated by infection and respiratory failure. PT was consulted and recommended home health PT. Family request that she be reevaluated prior to discharge.  DVT prophylaxis: Lovenox  Code Status: FULL  Family Communication: discussed with family bedside Disposition Plan: Discharge home once improved.    Consultants:   Pulmonology   Physical therapy   Procedures:  None   Antimicrobials:  Azithromycin 11/10>>  Rocephin 11/10 >>   Subjective: Patient is feeling well today. Overall  everything is improving.  Objective: Vitals:   11/02/16 0857 11/02/16 1635 11/02/16 1913 11/02/16 1914  BP:  116/63    Pulse:  (!)  102  (!) 105  Resp:  18  16  Temp:  97.8 F (36.6 C)    TempSrc:  Oral    SpO2: 98% 96% 95% 95%  Weight:      Height:        Intake/Output Summary (Last 24 hours) at 11/02/16 2019 Last data filed at 11/02/16 1800  Gross per 24 hour  Intake              900 ml  Output             1851 ml  Net             -951 ml   Filed Weights   10/29/16 0500 10/30/16 0500 10/31/16 0500  Weight: 78 kg (171 lb 15.3 oz) 78.3 kg (172 lb 9.9 oz) 77.3 kg (170 lb 6.7 oz)    Examination:  General exam: Appears calm and comfortable  Respiratory system: Clear to auscultation. Respiratory effort normal. Cardiovascular system: S1 & S2 heard, RRR. No JVD, murmurs, rubs, gallops or clicks. No pedal edema. Gastrointestinal system: Abdomen is nondistended, soft and nontender. No organomegaly or masses felt. Normal bowel sounds heard. Central nervous system: Alert and oriented. No focal neurological deficits. Extremities: Symmetric 5 x 5 power. Skin: No rashes, lesions or ulcers Psychiatry: Judgement and insight appear normal. Mood & affect appropriate.     Data Reviewed: I have personally reviewed following labs and imaging studies  CBC:  Recent Labs Lab 10/27/16 0706 10/28/16 0516 11/01/16 0635  WBC 9.2 10.0 9.0  HGB 9.6* 9.1* 10.2*  HCT 36.2 34.2* 37.1  MCV 82.1 82.6 79.4  PLT 408* 332 [id number]*   Basic Metabolic Panel:  Recent Labs Lab 10/30/16 0444 10/31/16 0445 11/01/16 0635 11/02/16 0637  NA 135 135 134* 134*  K 4.3 4.7 4.5 4.5  CL 89* 94* 89* 93*  CO2 37* 32 34* 31  GLUCOSE 189* 213* 217* 204*  BUN 35* 30* 33* 33*  CREATININE 0.65 0.61 0.68 0.73  CALCIUM 9.3 9.6 10.2 10.1   GFR: Estimated Creatinine Clearance: 55.9 mL/min (by C-G formula based on SCr of 0.73 mg/dL). Liver Function Tests: No results for input(s): AST, ALT, ALKPHOS, BILITOT, PROT, ALBUMIN in the last 168 hours. No results for input(s): LIPASE, AMYLASE in the last 168 hours. No results for input(s): AMMONIA in  the last 168 hours. Coagulation Profile: No results for input(s): INR, PROTIME in the last 168 hours. Cardiac Enzymes: No results for input(s): CKTOTAL, CKMB, CKMBINDEX, TROPONINI in the last 168 hours. BNP (last 3 results) No results for input(s): PROBNP in the last 8760 hours. HbA1C: No results for input(s): HGBA1C in the last 72 hours. CBG:  Recent Labs Lab 11/01/16 1610 11/01/16 2130 11/02/16 0754 11/02/16 1139 11/02/16 1632  GLUCAP 268* 256* 152* 219* 275*   Lipid Profile: No results for input(s): CHOL, HDL, LDLCALC, TRIG, CHOLHDL, LDLDIRECT in the last 72 hours. Thyroid Function Tests: No results for input(s): TSH, T4TOTAL, FREET4, T3FREE, THYROIDAB in the last 72 hours. Anemia Panel: No results for input(s): VITAMINB12, FOLATE, FERRITIN, TIBC, IRON, RETICCTPCT in the last 72 hours. Sepsis Labs:  Recent Labs Lab 10/28/16 0516 10/29/16 0450 10/31/16 0445  PROCALCITON <0.10 <0.10 <0.10    Recent Results (from the past 240 hour(s))  Blood culture (routine x 2)     Status: None  Collection Time: 10/26/16  6:43 PM  Result Value Ref Range Status   Specimen Description BLOOD LEFT ARM  Final   Special Requests BOTTLES DRAWN AEROBIC AND ANAEROBIC [place]  Final   Culture NO GROWTH 5 DAYS  Final   Report Status 10/31/2016 FINAL  Final  Blood culture (routine x 2)     Status: None   Collection Time: 10/26/16  6:50 PM  Result Value Ref Range Status   Specimen Description BLOOD LEFT HAND  Final   Special Requests BOTTLES DRAWN AEROBIC AND ANAEROBIC [place]  Final   Culture NO GROWTH 5 DAYS  Final   Report Status 10/31/2016 FINAL  Final  Culture, sputum-assessment     Status: None   Collection Time: 10/27/16  6:05 AM  Result Value Ref Range Status   Specimen Description SPUTUM  Final   Special Requests NONE  Final   Sputum evaluation   Final    MICROSCOPIC FINDINGS SUGGEST THAT THIS SPECIMEN IS NOT REPRESENTATIVE OF LOWER RESPIRATORY SECRETIONS. PLEASE  RECOLLECT. NOTIFIED R.CHAPELLE AT 1040A ON [id number] BY THOMPSON S.    Report Status 10/27/2016 FINAL  Final  Influenza virus ag, a+b (DFA)     Status: None   Collection Time: 10/27/16  2:30 PM  Result Value Ref Range Status   Influenza Virus A and B Ag REPORT  Final    Comment: (NOTE) Influenza Virus Type A#B Antigen, DFA SOURCE : NOT SUPPLIED Influenza Type A Ag, DFA        Negative for influenza virus Influenza Type B Ag, DFA        Negative for influenza virus For maximum sensitivity in the diagnosis of viral infections, a negative or suspected positive result should be confirmed by viral culture. Performed at [facility], Urine     Status: None   Collection Time: 10/27/16  6:17 PM  Result Value Ref Range Status   Specimen Description URINE, CLEAN CATCH  Final   Special Requests NONE  Final   Culture NO GROWTH Performed at [facility]   Final   Report Status 10/30/2016 FINAL  Final  Culture, respiratory (NON-Expectorated)     Status: None   Collection Time: 10/27/16  7:10 PM  Result Value Ref Range Status   Specimen Description SPU  Final   Special Requests NONE  Final   Gram Stain   Final    RARE WBC PRESENT, PREDOMINANTLY PMN FEW SQUAMOUS EPITHELIAL CELLS PRESENT ABUNDANT YEAST FEW GRAM NEGATIVE RODS RARE GRAM POSITIVE COCCI IN PAIRS RARE GRAM VARIABLE ROD Performed at [facility]    Culture MULTIPLE ORGANISMS PRESENT, NONE PREDOMINANT  Final   Report Status 10/30/2016 FINAL  Final  MRSA PCR Screening     Status: None   Collection Time: 10/27/16  9:48 PM  Result Value Ref Range Status   MRSA by PCR NEGATIVE NEGATIVE Final    Comment:        The GeneXpert MRSA Assay (FDA approved for NASAL specimens only), is one component of a comprehensive MRSA colonization surveillance program. It is not intended to diagnose MRSA infection nor to guide or monitor treatment for MRSA infections.          Radiology Studies: No  results found.      Scheduled Meds: . aspirin EC  81 mg Oral Daily  . chlorhexidine  15 mL Mouth Rinse BID  . dorzolamide-timolol  1 drop Both Eyes BID  . enoxaparin (LOVENOX) injection  40 mg Subcutaneous Q24H  . fenofibrate  160 mg Oral Daily  . gabapentin  100 mg Oral TID  . insulin aspart  0-15 Units Subcutaneous TID WC  . insulin glargine  10 Units Subcutaneous QHS  . latanoprost  1 drop Both Eyes QHS  . mouth rinse  15 mL Mouth Rinse q12n4p  . metFORMIN  1,000 mg Oral BID WC  . Milnacipran  50 mg Oral BID  . mometasone-formoterol  2 puff Inhalation BID  . pantoprazole  40 mg Oral Daily  . polyethylene glycol  17 g Oral Daily  . [START ON 11/03/2016] predniSONE  40 mg Oral Q breakfast  . rosuvastatin  40 mg Oral QPM  . senna  1 tablet Oral QHS   Continuous Infusions:   LOS: 7 days    Time spent: 25 minutes     Kathie Dike, MD [facility] If 7PM-7AM, please contact night-coverage [email or web address] Password [email or web address] 11/02/2016, 8:19 PM

## 2016-11-02 NOTE — Care Management Note (Signed)
Case Management Note  Patient Details  Name: [name] MRN: [id number] Date of Birth: 01-Sep-1933    Expected Discharge Date:       11/02/2016         Expected Discharge Plan:  [place]  In-House Referral:  NA  Discharge planning Services  CM Consult  Post Acute Care Choice:  Home Health Choice offered to:  Patient  DME Arranged:    DME Agency:     HH Arranged:  PT, RN [place] Agency:  [place]  Status of Service:  In process, will continue to follow  If discussed at Long Length of Stay Meetings, dates discussed:    Additional Comments: Patient may discharge over the weekend. Will have a trial overnight off Bipap. May need oxygen at discharge. Will need O2 assessment. Home health arranged with [facility] for PT and RN. Patient will need a sleep study done as outpatient in order to qualify for Bi Pap, patient is aware.   Jaelen Soth, Chauncey [place], RN 11/02/2016, 12:31 PM

## 2016-11-02 NOTE — Progress Notes (Signed)
Subjective: She says she feels okay. No new complaints. She's coughing up some but not bringing anything up. No nausea vomiting diarrhea or chest pain  Objective: Vital signs in last 24 hours: Temp:  [97.8 F (36.6 C)-98.4 F (36.9 C)] 98.4 F (36.9 C) (11/17 0449) Pulse Rate:  [79-116] 102 (11/17 0449) Resp:  [18-20] 18 (11/17 0449) BP: (81-125)/(50-74) 123/61 (11/17 0449) SpO2:  [93 %-99 %] 98 % (11/17 0449) Weight change:  Last BM Date: 10/31/16  Intake/Output from previous day: 11/16 0701 - 11/17 0700 In: 840 [P.O.:840] Out: 2800 [Urine:2800]  PHYSICAL EXAM General appearance: alert, cooperative and no distress Resp: rhonchi bilaterally Cardio: regular rate and rhythm, S1, S2 normal, no murmur, click, rub or gallop GI: soft, non-tender; bowel sounds normal; no masses,  no organomegaly Extremities: extremities normal, atraumatic, no cyanosis or edema Skin warm and dry. Mucous membranes are moist.  Lab Results:  Results for orders placed or performed during the hospital encounter of 10/26/16 (from the past 48 hour(s))  Glucose, capillary     Status: Abnormal   Collection Time: 10/31/16 11:29 AM  Result Value Ref Range   Glucose-Capillary 159 (H) 65 - 99 mg/dL  Glucose, capillary     Status: Abnormal   Collection Time: 10/31/16  4:24 PM  Result Value Ref Range   Glucose-Capillary 212 (H) 65 - 99 mg/dL   Comment 1 Notify RN    Comment 2 Document in Chart   Glucose, capillary     Status: Abnormal   Collection Time: 10/31/16 10:14 PM  Result Value Ref Range   Glucose-Capillary 141 (H) 65 - 99 mg/dL  Basic metabolic panel     Status: Abnormal   Collection Time: 11/01/16  6:35 AM  Result Value Ref Range   Sodium 134 (L) 135 - 145 mmol/L   Potassium 4.5 3.5 - 5.1 mmol/L   Chloride 89 (L) 101 - 111 mmol/L   CO2 34 (H) 22 - 32 mmol/L   Glucose, Bld 217 (H) 65 - 99 mg/dL   BUN 33 (H) 6 - 20 mg/dL   Creatinine, Ser 0.68 0.44 - 1.00 mg/dL   Calcium 10.2 8.9 - 10.3 mg/dL    GFR calc non Af Amer >60 >60 mL/min   GFR calc Af Amer >60 >60 mL/min    Comment: (NOTE) The eGFR has been calculated using the CKD EPI equation. This calculation has not been validated in all clinical situations. eGFR's persistently <60 mL/min signify possible Chronic Kidney Disease.    Anion gap 11 5 - 15  CBC     Status: Abnormal   Collection Time: 11/01/16  6:35 AM  Result Value Ref Range   WBC 9.0 4.0 - 10.5 K/uL   RBC 4.67 3.87 - 5.11 MIL/uL   Hemoglobin 10.2 (L) 12.0 - 15.0 g/dL   HCT 37.1 36.0 - 46.0 %   MCV 79.4 78.0 - 100.0 fL   MCH 21.8 (L) 26.0 - 34.0 pg   MCHC 27.5 (L) 30.0 - 36.0 g/dL   RDW 18.4 (H) 11.5 - 15.5 %   Platelets 440 (H) 150 - 400 K/uL  Glucose, capillary     Status: Abnormal   Collection Time: 11/01/16  7:46 AM  Result Value Ref Range   Glucose-Capillary 193 (H) 65 - 99 mg/dL  Blood gas, arterial     Status: Abnormal   Collection Time: 11/01/16  9:20 AM  Result Value Ref Range   O2 Content 2.0 L/min   Delivery systems NASAL CANNULA  pH, Arterial 7.390 7.350 - 7.450   pCO2 arterial 55.7 (H) 32.0 - 48.0 mmHg   pO2, Arterial 63.1 (L) 83.0 - 108.0 mmHg   Bicarbonate 30.9 (H) 20.0 - 28.0 mmol/L   Acid-Base Excess 8.0 (H) 0.0 - 2.0 mmol/L   O2 Saturation 89.8 %   Collection site RIGHT BRACHIAL    Drawn by [phone]    Sample type ARTERIAL   Glucose, capillary     Status: Abnormal   Collection Time: 11/01/16 11:13 AM  Result Value Ref Range   Glucose-Capillary 220 (H) 65 - 99 mg/dL  Glucose, capillary     Status: Abnormal   Collection Time: 11/01/16  4:10 PM  Result Value Ref Range   Glucose-Capillary 268 (H) 65 - 99 mg/dL   Comment 1 Notify RN    Comment 2 Document in Chart   Glucose, capillary     Status: Abnormal   Collection Time: 11/01/16  9:30 PM  Result Value Ref Range   Glucose-Capillary 256 (H) 65 - 99 mg/dL   Comment 1 Notify RN    Comment 2 Document in Chart   Basic metabolic panel     Status: Abnormal   Collection Time:  11/02/16  6:37 AM  Result Value Ref Range   Sodium 134 (L) 135 - 145 mmol/L   Potassium 4.5 3.5 - 5.1 mmol/L   Chloride 93 (L) 101 - 111 mmol/L   CO2 31 22 - 32 mmol/L   Glucose, Bld 204 (H) 65 - 99 mg/dL   BUN 33 (H) 6 - 20 mg/dL   Creatinine, Ser 0.73 0.44 - 1.00 mg/dL   Calcium 10.1 8.9 - 10.3 mg/dL   GFR calc non Af Amer >60 >60 mL/min   GFR calc Af Amer >60 >60 mL/min    Comment: (NOTE) The eGFR has been calculated using the CKD EPI equation. This calculation has not been validated in all clinical situations. eGFR's persistently <60 mL/min signify possible Chronic Kidney Disease.    Anion gap 10 5 - 15    ABGS  Recent Labs  11/01/16 0920  PHART 7.390  PO2ART 63.1*  HCO3 30.9*   CULTURES Recent Results (from the past 240 hour(s))  Blood culture (routine x 2)     Status: None   Collection Time: 10/26/16  6:43 PM  Result Value Ref Range Status   Specimen Description BLOOD LEFT ARM  Final   Special Requests BOTTLES DRAWN AEROBIC AND ANAEROBIC [place]  Final   Culture NO GROWTH 5 DAYS  Final   Report Status 10/31/2016 FINAL  Final  Blood culture (routine x 2)     Status: None   Collection Time: 10/26/16  6:50 PM  Result Value Ref Range Status   Specimen Description BLOOD LEFT HAND  Final   Special Requests BOTTLES DRAWN AEROBIC AND ANAEROBIC [place]  Final   Culture NO GROWTH 5 DAYS  Final   Report Status 10/31/2016 FINAL  Final  Culture, sputum-assessment     Status: None   Collection Time: 10/27/16  6:05 AM  Result Value Ref Range Status   Specimen Description SPUTUM  Final   Special Requests NONE  Final   Sputum evaluation   Final    MICROSCOPIC FINDINGS SUGGEST THAT THIS SPECIMEN IS NOT REPRESENTATIVE OF LOWER RESPIRATORY SECRETIONS. PLEASE RECOLLECT. NOTIFIED R.CHAPELLE AT 1040A ON [id number] BY THOMPSON S.    Report Status 10/27/2016 FINAL  Final  Influenza virus ag, a+b (DFA)     Status: None  Collection Time: 10/27/16  2:30 PM  Result Value Ref Range  Status   Influenza Virus A and B Ag REPORT  Final    Comment: (NOTE) Influenza Virus Type A#B Antigen, DFA SOURCE : NOT SUPPLIED Influenza Type A Ag, DFA        Negative for influenza virus Influenza Type B Ag, DFA        Negative for influenza virus For maximum sensitivity in the diagnosis of viral infections, a negative or suspected positive result should be confirmed by viral culture. Performed at [facility], Urine     Status: None   Collection Time: 10/27/16  6:17 PM  Result Value Ref Range Status   Specimen Description URINE, CLEAN CATCH  Final   Special Requests NONE  Final   Culture NO GROWTH Performed at [facility]   Final   Report Status 10/30/2016 FINAL  Final  Culture, respiratory (NON-Expectorated)     Status: None   Collection Time: 10/27/16  7:10 PM  Result Value Ref Range Status   Specimen Description SPU  Final   Special Requests NONE  Final   Gram Stain   Final    RARE WBC PRESENT, PREDOMINANTLY PMN FEW SQUAMOUS EPITHELIAL CELLS PRESENT ABUNDANT YEAST FEW GRAM NEGATIVE RODS RARE GRAM POSITIVE COCCI IN PAIRS RARE GRAM VARIABLE ROD Performed at [facility]    Culture MULTIPLE ORGANISMS PRESENT, NONE PREDOMINANT  Final   Report Status 10/30/2016 FINAL  Final  MRSA PCR Screening     Status: None   Collection Time: 10/27/16  9:48 PM  Result Value Ref Range Status   MRSA by PCR NEGATIVE NEGATIVE Final    Comment:        The GeneXpert MRSA Assay (FDA approved for NASAL specimens only), is one component of a comprehensive MRSA colonization surveillance program. It is not intended to diagnose MRSA infection nor to guide or monitor treatment for MRSA infections.    Studies/Results: No results found.  Medications:  Prior to Admission:  Prescriptions Prior to Admission  Medication Sig Dispense Refill Last Dose  . aspirin EC 81 MG tablet Take 81 mg by mouth daily.   10/26/2016 at Unknown time  . Choline Fenofibrate  135 MG capsule Take 135 mg by mouth daily.   10/26/2016 at Unknown time  . dorzolamide-timolol (COSOPT) 22.3-6.8 MG/ML ophthalmic solution Place 1 drop into both eyes 2 (two) times daily.    10/26/2016 at Unknown time  . gabapentin (NEURONTIN) 100 MG capsule Take 100 mg by mouth 3 (three) times daily.    10/26/2016 at Unknown time  . latanoprost (XALATAN) 0.005 % ophthalmic solution Place 1 drop into both eyes at bedtime.   10/25/2016 at Unknown time  . magnesium hydroxide (MILK OF MAGNESIA) 400 MG/5ML suspension Take 30 mLs by mouth every other day.   Past Week at Unknown time  . Melatonin 5 MG TABS Take 5 mg by mouth at bedtime.   10/25/2016 at Unknown time  . Milnacipran (SAVELLA) 50 MG TABS Take 50 mg by mouth 2 (two) times daily.   10/26/2016 at Unknown time  . Multiple Vitamins-Minerals (MULTIVITAMIN WITH MINERALS) tablet Take 1 tablet by mouth every evening.    10/25/2016 at Unknown time  . nitroGLYCERIN (NITROSTAT) 0.4 MG SL tablet Place 1 tablet (0.4 mg total) under the tongue every 5 (five) minutes as needed for chest pain. 25 tablet 3 unknown  . pantoprazole (PROTONIX) 40 MG tablet Take 40 mg by mouth  daily.    10/26/2016 at Unknown time  . pioglitazone (ACTOS) 15 MG tablet Take 15 mg by mouth daily.    10/26/2016 at Unknown time  . rosuvastatin (CRESTOR) 40 MG tablet Take 40 mg by mouth every evening.    10/25/2016 at Unknown time  . sitaGLIPtan-metformin (JANUMET) 50-1000 MG per tablet Take 1 tablet by mouth 2 (two) times daily with a meal.   10/26/2016 at Unknown time   Scheduled: . aspirin EC  81 mg Oral Daily  . azithromycin  500 mg Oral q1800  . cefTRIAXone (ROCEPHIN)  IV  1 g Intravenous Q24H  . chlorhexidine  15 mL Mouth Rinse BID  . dorzolamide-timolol  1 drop Both Eyes BID  . enoxaparin (LOVENOX) injection  40 mg Subcutaneous Q24H  . fenofibrate  160 mg Oral Daily  . gabapentin  100 mg Oral TID  . insulin aspart  0-15 Units Subcutaneous TID WC  . insulin glargine  10 Units  Subcutaneous QHS  . latanoprost  1 drop Both Eyes QHS  . mouth rinse  15 mL Mouth Rinse q12n4p  . metFORMIN  1,000 mg Oral BID WC  . methylPREDNISolone (SOLU-MEDROL) injection  40 mg Intravenous Q12H  . Milnacipran  50 mg Oral BID  . mometasone-formoterol  2 puff Inhalation BID  . pantoprazole  40 mg Oral Daily  . polyethylene glycol  17 g Oral Daily  . rosuvastatin  40 mg Oral QPM  . senna  1 tablet Oral QHS   Continuous:  ULA:[id number], albuterol, magnesium hydroxide  Assesment: She was admitted with community-acquired pneumonia. She developed acute on chronic hypoxic and hypercapnic respiratory failure requiring BiPAP. She has improved. She is still using BiPAP at night. She is clearly markedly better as far as the pneumonia is concerned. She has COPD with exacerbation as well. She also had acute diastolic heart failure. All of the above are improving. Principal Problem:   CAP (community acquired pneumonia) Active Problems:   Difficulty walking   Coronary atherosclerosis of native coronary artery   Acute respiratory failure with hypercapnia (HCC)   Acute diastolic heart failure (HCC)   Normocytic anemia   Diabetes mellitus with neuropathy (HCC)   COPD with exacerbation ([place])    Plan: Continue current treatments.    LOS: 7 days   Janazia Schreier L 11/02/2016, 8:21 AM

## 2016-11-02 NOTE — Progress Notes (Addendum)
Inpatient Diabetes Program Recommendations  AACE/ADA: New Consensus Statement on Inpatient Glycemic Control (2015)  Target Ranges:  Prepandial:   less than 140 mg/dL      Peak postprandial:   less than 180 mg/dL (1-2 hours)      Critically ill patients:  140 - 180 mg/dL  Results for [name], [name] (MRN [id number]) as of 11/02/2016 08:57  Ref. Range 11/01/2016 07:46 11/01/2016 11:13 11/01/2016 16:10 11/01/2016 21:30  Glucose-Capillary Latest Ref Range: 65 - 99 mg/dL 193 (H) 220 (H) 268 (H) 256 (H)    Review of Glycemic Control  Diabetes history: DM2 Outpatient Diabetes medications: Actos 15 mg daily, Janumet 50-1000 mg BID Current orders for Inpatient glycemic control: Lantus 10 units QHS, Metformin 1000 mg BID, Novolog 0-15 units TID with meals  Inpatient Diabetes Program Recommendations: Insulin - Meal Coverage: If steroids are continued and patient is eating at least 50% of meals, please consider ordering Novolog 3 units TID with meals for meal coverage. Insulin-Correction: Please consider ordering Novolog bedtime correction scale.  Thanks, Barnie Alderman, RN, MSN, CDE Diabetes Coordinator Inpatient Diabetes Program [phone] (Team Pager from 8am to 5pm)

## 2016-11-03 LAB — BLOOD GAS, ARTERIAL
Acid-Base Excess: 9.6 mmol/L — ABNORMAL HIGH (ref 0.0–2.0)
BICARBONATE: 32.3 mmol/L — AB (ref 20.0–28.0)
DRAWN BY: 22223
O2 Content: 2 L/min
O2 Saturation: 95.1 %
PH ART: 7.36 (ref 7.350–7.450)
pCO2 arterial: 63.8 mmHg — ABNORMAL HIGH (ref 32.0–48.0)
pO2, Arterial: 84.9 mmHg (ref 83.0–108.0)

## 2016-11-03 LAB — GLUCOSE, CAPILLARY
GLUCOSE-CAPILLARY: 101 mg/dL — AB (ref 65–99)
GLUCOSE-CAPILLARY: 248 mg/dL — AB (ref 65–99)
GLUCOSE-CAPILLARY: 317 mg/dL — AB (ref 65–99)
GLUCOSE-CAPILLARY: 335 mg/dL — AB (ref 65–99)
Glucose-Capillary: 256 mg/dL — ABNORMAL HIGH (ref 65–99)

## 2016-11-03 MED ORDER — MOMETASONE FURO-FORMOTEROL FUM 100-5 MCG/ACT IN AERO: 2.0000 | INHALATION_SPRAY | Freq: Two times a day (BID) | RESPIRATORY_TRACT | 0 refills | Status: DC

## 2016-11-03 MED ORDER — ALBUTEROL SULFATE (2.5 MG/3ML) 0.083% IN NEBU: 2.5000 mg | INHALATION_SOLUTION | Freq: Four times a day (QID) | RESPIRATORY_TRACT | 12 refills | Status: DC | PRN

## 2016-11-03 MED ORDER — PREDNISONE 10 MG PO TABS: ORAL_TABLET | ORAL | 0 refills | Status: DC

## 2016-11-03 MED ORDER — DIPHENHYDRAMINE HCL 25 MG PO CAPS
25.0000 mg | ORAL_CAPSULE | Freq: Four times a day (QID) | ORAL | Status: DC | PRN
  Administered 2016-11-03: 25 mg via ORAL
  Filled 2016-11-03: qty 1

## 2016-11-03 MED ORDER — DIPHENHYDRAMINE HCL 25 MG PO CAPS
25.0000 mg | ORAL_CAPSULE | Freq: Once | ORAL | Status: AC
  Administered 2016-11-03: 25 mg via ORAL
  Filled 2016-11-03: qty 1

## 2016-11-03 NOTE — Progress Notes (Signed)
Spoke with Chasity at [facility] at 1730 to follow up on referral.  She stated that she had not received any information on pt.  Information was faxed again [phone]).  At this time we have still not heard from [facility] and family is requesting the pt be discharged in the morning since it is getting late.  I have called the after hours answering service and am still waiting on call back.

## 2016-11-03 NOTE — Progress Notes (Signed)
She is awake and alert and in no acute distress. She did not use BiPAP last night but she is on oxygen. Her arterial blood gas showed her PCO2 went up into the 60s off BiPAP and she was in the mid 50s on BiPAP. Her pH is okay. Her mental status is okay.  I think she is probably okay for discharge. I'd like to see what her oxygen level is off oxygen because I think she'll probably need oxygen at home. She has been scheduled by case management for outpatient sleep study which I think is appropriate. I did discuss BiPAP with her daughter at bedside and told her that I don't think that she would qualify based on her blood gas but that we could get it however it would be their financial responsibility.

## 2016-11-03 NOTE — Progress Notes (Signed)
Discharge cancelled until AM. Will refax info to [facility].

## 2016-11-03 NOTE — Discharge Instructions (Signed)
Community-Acquired Pneumonia, Adult °Introduction °Pneumonia is an infection of the lungs. One type of pneumonia can happen while a person is in a hospital. A different type can happen when a person is not in a hospital (community-acquired pneumonia). It is easy for this kind to spread from person to person. It can spread to you if you breathe near an infected person who coughs or sneezes. Some symptoms include: °· A dry cough. °· A wet (productive) cough. °· Fever. °· Sweating. °· Chest pain. °Follow these instructions at home: °· Take over-the-counter and prescription medicines only as told by your doctor. °¨ Only take cough medicine if you are losing sleep. °¨ If you were prescribed an antibiotic medicine, take it as told by your doctor. Do not stop taking the antibiotic even if you start to feel better. °· Sleep with your head and neck raised (elevated). You can do this by putting a few pillows under your head, or you can sleep in a recliner. °· Do not use tobacco products. These include cigarettes, chewing tobacco, and e-cigarettes. If you need help quitting, ask your doctor. °· Drink enough water to keep your pee (urine) clear or pale yellow. °A shot (vaccine) can help prevent pneumonia. Shots are often suggested for: °· People older than 80 years of age. °· People older than 80 years of age: °¨ Who are having cancer treatment. °¨ Who have long-term (chronic) lung disease. °¨ Who have problems with their body's defense system (immune system). °You may also prevent pneumonia if you take these actions: °· Get the flu (influenza) shot every year. °· Go to the dentist as often as told. °· Wash your hands often. If soap and water are not available, use hand sanitizer. °Contact a doctor if: °· You have a fever. °· You lose sleep because your cough medicine does not help. °Get help right away if: °· You are short of breath and it gets worse. °· You have more chest pain. °· Your sickness gets worse. This is very  serious if: °¨ You are an older adult. °¨ Your body's defense system is weak. °· You cough up blood. °This information is not intended to replace advice given to you by your health care provider. Make sure you discuss any questions you have with your health care provider. °Document Released: 05/21/2008 Document Revised: 05/10/2016 Document Reviewed: 03/30/2015 °© 2017 Elsevier ° °

## 2016-11-03 NOTE — Progress Notes (Signed)
Patient to be discharged home. Home health RN, PT, home O2 referral called in to [facility]. Faxed orders and facesheet as requested. Reviewed discharge instructions with pt, daughter at bedside. Given copy of AVS, verbalized understanding. Daughter states will call Monday to arrange PCP follow-up and sleep study. Prescriptions sent to pharmacy per MD. Pt in stable condition, awaiting home O2 for discharge home. Donavan Foil, RN

## 2016-11-03 NOTE — Discharge Summary (Signed)
Physician Discharge Summary  [name] [id number] DOB: [birth year] DOA: 10/26/2016  PCP: Wende Neighbors, MD  Admit date: 10/26/2016 Discharge date: 11/03/2016  Admitted From: Home Disposition:  Home  Recommendations for Outpatient Follow-up:  1. Follow up with PCP in 1-2 weeks 2. Patient will need outpatient sleep study   Home Health: RN, PT Equipment/Devices: oxygen at 2L, neb machine  Discharge Condition: Stable CODE STATUS: Full Diet recommendation: Carb Modified   Brief/Interim Summary: 81 yof with a hx of HLD, CAD s/p CABG, depression, HTN, and type 2 DM, presented with complaints of a productive cough with green sputum and shortness of breath. Prior to admission, she had an outpatient chest x-ray ordered by her PCP which revealed right middle lobe pneumonia, in which she was advised to come to the ED. While in the ED, serology was unremarkable except for hemoglobin 10.0. On 11/11 she developed respiratory distress with hypercapnia. She was empirically given Lasix and placed on BiPAP and transferred to SDU. Her echocardiogram revealed an EF of 60% with grade 2 diastolic dysfunction. She was noted to be in COPD exacerbation secondary to pneumonia, in which pulmonology was consulted. She was treated with rocephin and azithromycin for PNA, as well as nebs and steroids for COPD which showed significant improvement after a few days of treatments. Pulmonology evaluated the patient and recommends oxygen as an outpatient. He also believes that a sleep study as an outpatient is appropriate. She is feeling much better and will be discharged home later today.  Discharge Diagnoses:  Principal Problem:   CAP (community acquired pneumonia) Active Problems:   Difficulty walking   Coronary atherosclerosis of native coronary artery   Acute respiratory failure with hypercapnia (HCC)   Acute diastolic heart failure (HCC)   Normocytic anemia   Diabetes mellitus with neuropathy (HCC)   COPD  with exacerbation ([place])  1. CAP. Patient was afebrile with a normal WBC on admission. Her chest x-ray revealed pneumonia and she was started on Rocephin and azithromycin. She has since become afebrile. Her HIV was nonreactive. Strep pneumoand legionella antigens werenegative. Blood cultures arenegative to date.Influenza panel was negative. Continue current management with antibiotics. She has completed an adequate antibiotic course in the hospital. 2. Acute respiratory failure with hypoxia, hypercapnia. On11/11, the patient developed respiratory distress.She was transferred to SDU when she was found to have respiratory acidosis, requiring Bipap. With treatment, she began to improve. BiPAP was changed to daily at bedtime and was subsequently discontinued. She still requires supplemental oxygen on discharge. She was followed by pulmonology in the hospital. It is so that she may need BiPAP at home, will have to go sleep study prior to initiation of BiPAP. This will be arranged as an outpatient. 3. COPD exacerbation. Patient's chest x-ray is consistent with emphysema and she has had a long history of smoking, in which she quit greater than 20 years ago. She was treated with nebulizer treatments and intravenous steroids. Steroids have not been changed to a prednisone taper. Continue Dulera inhaler.  4. Acute diastolic CHF. Her echocardiogramrevealed an EF of 60-65% and grade 2 diastolic dysfunction. She did have evidence of volume overload and was treated with IV Lasix. Since then her volume status has improved and she is breathing comfortably.. 5. CAD. Continue on aspirin. No chest pain. 6. HLD. Continue statin.  7. Type 2 DM. She was started on sliding scale NovoLog and her oral agents were held.On discharge, we will resume her oral diabetic agents. 8. Normocytic anemia. No signs  of bleeding. Continue to monitor.  9. Ambulatory difficulty. This is long-standing and may be associated with peripheral  neuropathy or DJD, exacerbated by infection and respiratory failure. PT was consulted and recommended home health PT.    Consultants:   Pulmonology   Physical therapy   Procedures:  ECHO Study Conclusions  - Left ventricle: The cavity size was normal. Wall thickness was increased in a pattern of mild LVH. Systolic function was normal. The estimated ejection fraction was in the range of 60% to 65%. Wall motion was normal; there were no regional wall motion abnormalities. Features are consistent with a pseudonormal left ventricular filling pattern, with concomitant abnormal relaxation and increased filling pressure (grade 2 diastolic dysfunction). - Atrial septum: There was increased thickness of the septum, consistent with lipomatous hypertrophy. - Pulmonary arteries: PA peak pressure: 35 mm Hg (S).   Antimicrobials:  Azithromycin 11/10>> 11/17  Rocephin 11/10 >> 11/17  Discharge Instructions  Discharge Instructions    DME Nebulizer machine    Complete by:  As directed    Patient needs a nebulizer to treat with the following condition:  COPD (chronic obstructive pulmonary disease) (HCC)   Diet - low sodium heart healthy    Complete by:  As directed    Increase activity slowly    Complete by:  As directed        Medication List    TAKE these medications   albuterol (2.5 MG/3ML) 0.083% nebulizer solution Commonly known as:  PROVENTIL Take 3 mLs (2.5 mg total) by nebulization every 6 (six) hours as needed for wheezing or shortness of breath.   aspirin EC 81 MG tablet Take 81 mg by mouth daily.   Choline Fenofibrate 135 MG capsule Take 135 mg by mouth daily.   dorzolamide-timolol 22.3-6.8 MG/ML ophthalmic solution Commonly known as:  COSOPT Place 1 drop into both eyes 2 (two) times daily.   gabapentin 100 MG capsule Commonly known as:  NEURONTIN Take 100 mg by mouth 3 (three) times daily.   latanoprost 0.005 % ophthalmic  solution Commonly known as:  XALATAN Place 1 drop into both eyes at bedtime.   magnesium hydroxide 400 MG/5ML suspension Commonly known as:  MILK OF MAGNESIA Take 30 mLs by mouth every other day.   Melatonin 5 MG Tabs Take 5 mg by mouth at bedtime.   mometasone-formoterol 100-5 MCG/ACT Aero Commonly known as:  DULERA Inhale 2 puffs into the lungs 2 (two) times daily.   multivitamin with minerals tablet Take 1 tablet by mouth every evening.   nitroGLYCERIN 0.4 MG SL tablet Commonly known as:  NITROSTAT Place 1 tablet (0.4 mg total) under the tongue every 5 (five) minutes as needed for chest pain.   pantoprazole 40 MG tablet Commonly known as:  PROTONIX Take 40 mg by mouth daily.   pioglitazone 15 MG tablet Commonly known as:  ACTOS Take 15 mg by mouth daily.   predniSONE 10 MG tablet Commonly known as:  DELTASONE Take 30mg  daily for 1 day then 20mg  daily for 1 day then 10mg  daily for 1 day then stop   rosuvastatin 40 MG tablet Commonly known as:  CRESTOR Take 40 mg by mouth every evening.   SAVELLA 50 MG Tabs tablet Generic drug:  Milnacipran Take 50 mg by mouth 2 (two) times daily.   sitaGLIPtin-metformin 50-1000 MG tablet Commonly known as:  JANUMET Take 1 tablet by mouth 2 (two) times daily with a meal.  Durable Medical Equipment        Start     Ordered   11/03/16 1417  For home use only DME oxygen  Once    Question Answer Comment  Mode or (Route) Nasal cannula   Liters per Minute 2   Frequency Continuous (stationary and portable oxygen unit needed)   Oxygen conserving device Yes   Oxygen delivery system Gas      11/03/16 1417   11/03/16 0000  DME Nebulizer machine    Question:  Patient needs a nebulizer to treat with the following condition  Answer:  COPD (chronic obstructive pulmonary disease) ([place])   11/03/16 1422     Follow-up Information    Wende Neighbors, MD. Schedule an appointment as soon as possible for a visit in 1 week(s).    Specialty:  Internal Medicine Why:  Follow-up with your primary care physician in 1-2 weeks from your discharge from the hospital.  Contact information: [place] [address] [phone]          Allergies  Allergen Reactions  . Codeine Other (See Comments)    Made teeth loose  . Penicillins Rash    Has patient had a PCN reaction causing immediate rash, facial/tongue/throat swelling, SOB or lightheadedness with hypotension: No Has patient had a PCN reaction causing severe rash involving mucus membranes or skin necrosis: Yes Has patient had a PCN reaction that required hospitalization No Has patient had a PCN reaction occurring within the last 10 years: No If all of the above answers are "NO", then may proceed with Cephalosporin use.   . Sulfa Antibiotics Rash      Procedures/Studies: Dg Chest 2 View  Result Date: 10/26/2016 CLINICAL DATA:  Productive cough and shortness of breath over the last 2-3 weeks. Smoking history. Previous CABG. EXAM: CHEST  2 VIEW COMPARISON:  04/28/2015 FINDINGS: Shunt tube overlies the right chest. Previous median sternotomy and CABG. Heart size is normal. Aortic atherosclerosis. The left lung is clear. There is infiltrate in volume loss in the right middle lobe consistent with pneumonia. No acute bone finding. IMPRESSION: Infiltrate and volume loss in the right middle lobe consistent with pneumonia. Electronically Signed   By: Nelson Chimes M.D.   On: 10/26/2016 15:19   Dg Chest Port 1 View  Result Date: 10/27/2016 CLINICAL DATA:  Shortness of breath worsening today. EXAM: PORTABLE CHEST 1 VIEW COMPARISON:  10/26/2016 FINDINGS: The cardio pericardial silhouette is enlarged. Interstitial markings are diffusely coarsened with chronic features. There is pulmonary vascular congestion without overt pulmonary edema. Airspace disease and volume loss seen previously in the right middle lobe has improved in the interval Ventriculostomy shunt  tubing overlies the right para midline thorax. Patient is status post CABG. The visualized bony structures of the thorax are intact. IMPRESSION: Interval improvement in aeration right middle lobe. Cardiomegaly with emphysema and chronic interstitial coarsening. Electronically Signed   By: Misty Stanley M.D.   On: 10/27/2016 18:37      Subjective:  Feeling well. Breathing is improving. Cough has improved with minimal sputum production.  Discharge Exam: Vitals:   11/03/16 0947 11/03/16 1448  BP:  134/75  Pulse: (!) 111 97  Resp:  18  Temp:  98.2 F (36.8 C)   Vitals:   11/03/16 0832 11/03/16 0947 11/03/16 0957 11/03/16 1448  BP:    134/75  Pulse:  (!) 111  97  Resp:    18  Temp:    98.2 F (36.8 C)  TempSrc:    Oral  SpO2: (!) 89% (!) 87% (!) 85% 98%  Weight:      Height:        General: Pt is alert, awake, not in acute distress Cardiovascular: RRR, S1/S2 +, no rubs, no gallops Respiratory: CTA bilaterally, no wheezing, no rhonchi Abdominal: Soft, NT, ND, bowel sounds + Extremities: no edema, no cyanosis    The results of significant diagnostics from this hospitalization (including imaging, microbiology, ancillary and laboratory) are listed below for reference.     Microbiology: Recent Results (from the past 240 hour(s))  Blood culture (routine x 2)     Status: None   Collection Time: 10/26/16  6:43 PM  Result Value Ref Range Status   Specimen Description BLOOD LEFT ARM  Final   Special Requests BOTTLES DRAWN AEROBIC AND ANAEROBIC [place]  Final   Culture NO GROWTH 5 DAYS  Final   Report Status 10/31/2016 FINAL  Final  Blood culture (routine x 2)     Status: None   Collection Time: 10/26/16  6:50 PM  Result Value Ref Range Status   Specimen Description BLOOD LEFT HAND  Final   Special Requests BOTTLES DRAWN AEROBIC AND ANAEROBIC [place]  Final   Culture NO GROWTH 5 DAYS  Final   Report Status 10/31/2016 FINAL  Final  Culture, sputum-assessment     Status: None    Collection Time: 10/27/16  6:05 AM  Result Value Ref Range Status   Specimen Description SPUTUM  Final   Special Requests NONE  Final   Sputum evaluation   Final    MICROSCOPIC FINDINGS SUGGEST THAT THIS SPECIMEN IS NOT REPRESENTATIVE OF LOWER RESPIRATORY SECRETIONS. PLEASE RECOLLECT. NOTIFIED R.CHAPELLE AT 1040A ON [id number] BY THOMPSON S.    Report Status 10/27/2016 FINAL  Final  Influenza virus ag, a+b (DFA)     Status: None   Collection Time: 10/27/16  2:30 PM  Result Value Ref Range Status   Influenza Virus A and B Ag REPORT  Final    Comment: (NOTE) Influenza Virus Type A#B Antigen, DFA SOURCE : NOT SUPPLIED Influenza Type A Ag, DFA        Negative for influenza virus Influenza Type B Ag, DFA        Negative for influenza virus For maximum sensitivity in the diagnosis of viral infections, a negative or suspected positive result should be confirmed by viral culture. Performed at [facility], Urine     Status: None   Collection Time: 10/27/16  6:17 PM  Result Value Ref Range Status   Specimen Description URINE, CLEAN CATCH  Final   Special Requests NONE  Final   Culture NO GROWTH Performed at [facility]   Final   Report Status 10/30/2016 FINAL  Final  Culture, respiratory (NON-Expectorated)     Status: None   Collection Time: 10/27/16  7:10 PM  Result Value Ref Range Status   Specimen Description SPU  Final   Special Requests NONE  Final   Gram Stain   Final    RARE WBC PRESENT, PREDOMINANTLY PMN FEW SQUAMOUS EPITHELIAL CELLS PRESENT ABUNDANT YEAST FEW GRAM NEGATIVE RODS RARE GRAM POSITIVE COCCI IN PAIRS RARE GRAM VARIABLE ROD Performed at [facility]    Culture MULTIPLE ORGANISMS PRESENT, NONE PREDOMINANT  Final   Report Status 10/30/2016 FINAL  Final  MRSA PCR Screening     Status: None   Collection Time: 10/27/16  9:48 PM  Result Value  Ref Range Status   MRSA by PCR NEGATIVE NEGATIVE Final    Comment:        The GeneXpert  MRSA Assay (FDA approved for NASAL specimens only), is one component of a comprehensive MRSA colonization surveillance program. It is not intended to diagnose MRSA infection nor to guide or monitor treatment for MRSA infections.      Labs: BNP (last 3 results) No results for input(s): BNP in the last 8760 hours. Basic Metabolic Panel:  Recent Labs Lab 10/30/16 0444 10/31/16 0445 11/01/16 0635 11/02/16 0637  NA 135 135 134* 134*  K 4.3 4.7 4.5 4.5  CL 89* 94* 89* 93*  CO2 37* 32 34* 31  GLUCOSE 189* 213* 217* 204*  BUN 35* 30* 33* 33*  CREATININE 0.65 0.61 0.68 0.73  CALCIUM 9.3 9.6 10.2 10.1   Liver Function Tests: No results for input(s): AST, ALT, ALKPHOS, BILITOT, PROT, ALBUMIN in the last 168 hours. No results for input(s): LIPASE, AMYLASE in the last 168 hours. No results for input(s): AMMONIA in the last 168 hours. CBC:  Recent Labs Lab 10/28/16 0516 11/01/16 0635  WBC 10.0 9.0  HGB 9.1* 10.2*  HCT 34.2* 37.1  MCV 82.6 79.4  PLT 332 440*   Cardiac Enzymes: No results for input(s): CKTOTAL, CKMB, CKMBINDEX, TROPONINI in the last 168 hours. BNP: Invalid input(s): POCBNP CBG:  Recent Labs Lab 11/02/16 1139 11/02/16 1632 11/02/16 2137 11/03/16 0737 11/03/16 1200  GLUCAP 219* 275* 174* 101* 248*   D-Dimer No results for input(s): DDIMER in the last 72 hours. Hgb A1c No results for input(s): HGBA1C in the last 72 hours. Lipid Profile No results for input(s): CHOL, HDL, LDLCALC, TRIG, CHOLHDL, LDLDIRECT in the last 72 hours. Thyroid function studies No results for input(s): TSH, T4TOTAL, T3FREE, THYROIDAB in the last 72 hours.  Invalid input(s): FREET3 Anemia work up No results for input(s): VITAMINB12, FOLATE, FERRITIN, TIBC, IRON, RETICCTPCT in the last 72 hours. Urinalysis    Component Value Date/Time   COLORURINE YELLOW 10/27/2016 1820   APPEARANCEUR CLEAR 10/27/2016 1820   LABSPEC 1.020 10/27/2016 1820   PHURINE 6.0 10/27/2016 1820    GLUCOSEU NEGATIVE 10/27/2016 1820   HGBUR NEGATIVE 10/27/2016 1820   BILIRUBINUR NEGATIVE 10/27/2016 1820   KETONESUR NEGATIVE 10/27/2016 1820   PROTEINUR NEGATIVE 10/27/2016 1820   NITRITE NEGATIVE 10/27/2016 1820   LEUKOCYTESUR NEGATIVE 10/27/2016 1820   Sepsis Labs Invalid input(s): PROCALCITONIN,  WBC,  LACTICIDVEN Microbiology Recent Results (from the past 240 hour(s))  Blood culture (routine x 2)     Status: None   Collection Time: 10/26/16  6:43 PM  Result Value Ref Range Status   Specimen Description BLOOD LEFT ARM  Final   Special Requests BOTTLES DRAWN AEROBIC AND ANAEROBIC [place]  Final   Culture NO GROWTH 5 DAYS  Final   Report Status 10/31/2016 FINAL  Final  Blood culture (routine x 2)     Status: None   Collection Time: 10/26/16  6:50 PM  Result Value Ref Range Status   Specimen Description BLOOD LEFT HAND  Final   Special Requests BOTTLES DRAWN AEROBIC AND ANAEROBIC [place]  Final   Culture NO GROWTH 5 DAYS  Final   Report Status 10/31/2016 FINAL  Final  Culture, sputum-assessment     Status: None   Collection Time: 10/27/16  6:05 AM  Result Value Ref Range Status   Specimen Description SPUTUM  Final   Special Requests NONE  Final   Sputum evaluation  Final    MICROSCOPIC FINDINGS SUGGEST THAT THIS SPECIMEN IS NOT REPRESENTATIVE OF LOWER RESPIRATORY SECRETIONS. PLEASE RECOLLECT. NOTIFIED R.CHAPELLE AT 1040A ON [id number] BY THOMPSON S.    Report Status 10/27/2016 FINAL  Final  Influenza virus ag, a+b (DFA)     Status: None   Collection Time: 10/27/16  2:30 PM  Result Value Ref Range Status   Influenza Virus A and B Ag REPORT  Final    Comment: (NOTE) Influenza Virus Type A#B Antigen, DFA SOURCE : NOT SUPPLIED Influenza Type A Ag, DFA        Negative for influenza virus Influenza Type B Ag, DFA        Negative for influenza virus For maximum sensitivity in the diagnosis of viral infections, a negative or suspected positive result should be confirmed by  viral culture. Performed at [facility], Urine     Status: None   Collection Time: 10/27/16  6:17 PM  Result Value Ref Range Status   Specimen Description URINE, CLEAN CATCH  Final   Special Requests NONE  Final   Culture NO GROWTH Performed at [facility]   Final   Report Status 10/30/2016 FINAL  Final  Culture, respiratory (NON-Expectorated)     Status: None   Collection Time: 10/27/16  7:10 PM  Result Value Ref Range Status   Specimen Description SPU  Final   Special Requests NONE  Final   Gram Stain   Final    RARE WBC PRESENT, PREDOMINANTLY PMN FEW SQUAMOUS EPITHELIAL CELLS PRESENT ABUNDANT YEAST FEW GRAM NEGATIVE RODS RARE GRAM POSITIVE COCCI IN PAIRS RARE GRAM VARIABLE ROD Performed at [facility]    Culture MULTIPLE ORGANISMS PRESENT, NONE PREDOMINANT  Final   Report Status 10/30/2016 FINAL  Final  MRSA PCR Screening     Status: None   Collection Time: 10/27/16  9:48 PM  Result Value Ref Range Status   MRSA by PCR NEGATIVE NEGATIVE Final    Comment:        The GeneXpert MRSA Assay (FDA approved for NASAL specimens only), is one component of a comprehensive MRSA colonization surveillance program. It is not intended to diagnose MRSA infection nor to guide or monitor treatment for MRSA infections.      Time coordinating discharge: Over 30 minutes  SIGNED:   Kathie Dike, MD  [facility] 11/03/2016, 4:33 PM Pager   If 7PM-7AM, please contact night-coverage [email or web address] Password [email or web address]

## 2016-11-03 NOTE — Progress Notes (Signed)
Physical Therapy Treatment Patient Details Name: [name] MRN: [id number] DOB: May 27, 1933 Today's Date: 11/03/2016    History of Present Illness 82 y.o. female with a history of hyperlipidemia, coronary artery disease status post multivessel CABG, depression, hypertension, diabetes type 2. Patient has been having a productive cough with sputum for the past couple of weeks that has been increasing. Patient has been increasing shortness of breath, particularly with exertion and improved with rest. Patient saw her primary care physician today, who obtained a chest x-ray. After diagnosing her with pneumonia, the patient was instructed to come to the emergency department for evaluation.  Dx: CAP, acute on chronic respiratory failure with hypercapnia, COPD exacerbation.      PT Comments    Pt continues to make progress towards improved mobility and functional strength. She does continue to need verbal cues for proper hand placement during sit to stand. Ambulated 100 ft with RW and CGA, stopping once to encourage pt to take several deep breaths after SaO2 dropped to 87% (pt denying any dizziness, etc during session.). Current plan remains appropriate with HHPT at discharge.   Follow Up Recommendations  Home health PT;Supervision - Intermittent     Equipment Recommendations  Other (comment) (pt would benefit from O2 monitor at home and I discussed this with her family)    Recommendations for Other Services       Precautions / Restrictions      Mobility  Bed Mobility               General bed mobility comments: Pt in BSC upon arrival   Transfers Overall transfer level: Modified independent Equipment used: Rolling walker (2 wheeled) Transfers: Sit to/from Stand Sit to Stand: Modified independent (Device/Increase time)         General transfer comment: cues for hand placement   Ambulation/Gait Ambulation/Gait assistance: Supervision;Min guard Ambulation Distance  (Feet): 100 Feet Assistive device: Rolling walker (2 wheeled) Gait Pattern/deviations: Decreased stride length;Decreased step length - right;Decreased step length - left   Gait velocity interpretation: Below normal speed for age/gender General Gait Details: Pt reporting no symptoms of dizziness, lightheadedness, etc. during ambulation. She was eager to continue walking    Stairs            Wheelchair Mobility    Modified Rankin (Stroke Patients Only)       Balance                                    Cognition Arousal/Alertness: Awake/alert Behavior During Therapy: WFL for tasks assessed/performed Overall Cognitive Status: Within Functional Limits for tasks assessed Area of Impairment: Orientation Orientation Level: Time                  Exercises      General Comments        Pertinent Vitals/Pain Pain Assessment: No/denies pain    Home Living                      Prior Function            PT Goals (current goals can now be found in the care plan section) Progress towards PT goals: Progressing toward goals    Frequency    Min 3X/week      PT Plan Current plan remains appropriate    Co-evaluation  End of Session Equipment Utilized During Treatment: Gait belt (trialed without O2 to assess need for possible d/c home) Activity Tolerance: Patient tolerated treatment well Patient left: in chair;with call bell/phone within reach;with family/visitor present     Time: 0840-0900 PT Time Calculation (min) (ACUTE ONLY): 20 min  Charges:  $Gait Training: 8-22 mins                    G Codes:      9:55 AM,11-26-16 Elly Modena PT, DPT [facility] Therapy [phone]

## 2016-11-04 DIAGNOSIS — Z23 Encounter for immunization: Secondary | ICD-10-CM | POA: Diagnosis not present

## 2016-11-04 LAB — GLUCOSE, CAPILLARY
Glucose-Capillary: 113 mg/dL — ABNORMAL HIGH (ref 65–99)
Glucose-Capillary: 215 mg/dL — ABNORMAL HIGH (ref 65–99)

## 2016-11-04 MED ORDER — ALBUTEROL SULFATE (2.5 MG/3ML) 0.083% IN NEBU: 2.5000 mg | INHALATION_SOLUTION | Freq: Four times a day (QID) | RESPIRATORY_TRACT | 12 refills | Status: DC | PRN

## 2016-11-04 NOTE — Progress Notes (Signed)
Spoke w/Andrea at [facility] [phone]).  She has all paper work needed for [facility] referral and home O2.  She states that she is "putting the ticket through" and someone will call with ETA.  Family notified.

## 2016-11-04 NOTE — Progress Notes (Signed)
Patient was to be discharged home yesterday, but home health and home oxygen could not be arranged by home health agency.  She denies any shortness of breath or chest pain. Chest exam shows bilateral rhonchi. No pedal edema.   Patient was admitted with CAP, COPD exacerbation and acute hypoxic and hypercapneic respiratory failure. Currently, she is resting and oxygen saturation is 88%. She desaturates to 87% while ambulation. Will continue bronchodilators at home, steroid taper and supplemental oxygen. She will get outpatient sleep study. Patient is stable for discharge home.  MEMON,JEHANZEB

## 2016-11-04 NOTE — Progress Notes (Signed)
Pt's O2 sat is 87-88% at rest on room air.

## 2016-11-05 DIAGNOSIS — D649 Anemia, unspecified: Secondary | ICD-10-CM | POA: Diagnosis not present

## 2016-11-05 DIAGNOSIS — Z7982 Long term (current) use of aspirin: Secondary | ICD-10-CM | POA: Diagnosis not present

## 2016-11-05 DIAGNOSIS — I2510 Atherosclerotic heart disease of native coronary artery without angina pectoris: Secondary | ICD-10-CM | POA: Diagnosis not present

## 2016-11-05 DIAGNOSIS — J189 Pneumonia, unspecified organism: Secondary | ICD-10-CM | POA: Diagnosis not present

## 2016-11-05 DIAGNOSIS — I5031 Acute diastolic (congestive) heart failure: Secondary | ICD-10-CM | POA: Diagnosis not present

## 2016-11-05 DIAGNOSIS — J441 Chronic obstructive pulmonary disease with (acute) exacerbation: Secondary | ICD-10-CM | POA: Diagnosis not present

## 2016-11-05 DIAGNOSIS — E785 Hyperlipidemia, unspecified: Secondary | ICD-10-CM | POA: Diagnosis not present

## 2016-11-05 DIAGNOSIS — E1140 Type 2 diabetes mellitus with diabetic neuropathy, unspecified: Secondary | ICD-10-CM | POA: Diagnosis not present

## 2016-11-05 DIAGNOSIS — Z7952 Long term (current) use of systemic steroids: Secondary | ICD-10-CM | POA: Diagnosis not present

## 2016-11-05 DIAGNOSIS — J440 Chronic obstructive pulmonary disease with acute lower respiratory infection: Secondary | ICD-10-CM | POA: Diagnosis not present

## 2016-11-06 DIAGNOSIS — Z7952 Long term (current) use of systemic steroids: Secondary | ICD-10-CM | POA: Diagnosis not present

## 2016-11-06 DIAGNOSIS — D649 Anemia, unspecified: Secondary | ICD-10-CM | POA: Diagnosis not present

## 2016-11-06 DIAGNOSIS — I2510 Atherosclerotic heart disease of native coronary artery without angina pectoris: Secondary | ICD-10-CM | POA: Diagnosis not present

## 2016-11-06 DIAGNOSIS — Z7982 Long term (current) use of aspirin: Secondary | ICD-10-CM | POA: Diagnosis not present

## 2016-11-06 DIAGNOSIS — J189 Pneumonia, unspecified organism: Secondary | ICD-10-CM | POA: Diagnosis not present

## 2016-11-06 DIAGNOSIS — E785 Hyperlipidemia, unspecified: Secondary | ICD-10-CM | POA: Diagnosis not present

## 2016-11-06 DIAGNOSIS — E1140 Type 2 diabetes mellitus with diabetic neuropathy, unspecified: Secondary | ICD-10-CM | POA: Diagnosis not present

## 2016-11-06 DIAGNOSIS — J441 Chronic obstructive pulmonary disease with (acute) exacerbation: Secondary | ICD-10-CM | POA: Diagnosis not present

## 2016-11-06 DIAGNOSIS — I5031 Acute diastolic (congestive) heart failure: Secondary | ICD-10-CM | POA: Diagnosis not present

## 2016-11-06 DIAGNOSIS — J440 Chronic obstructive pulmonary disease with acute lower respiratory infection: Secondary | ICD-10-CM | POA: Diagnosis not present

## 2016-11-07 ENCOUNTER — Other Ambulatory Visit (HOSPITAL_BASED_OUTPATIENT_CLINIC_OR_DEPARTMENT_OTHER): Payer: Self-pay

## 2016-11-07 DIAGNOSIS — G4730 Sleep apnea, unspecified: Secondary | ICD-10-CM

## 2016-11-09 DIAGNOSIS — I2510 Atherosclerotic heart disease of native coronary artery without angina pectoris: Secondary | ICD-10-CM | POA: Diagnosis not present

## 2016-11-09 DIAGNOSIS — J189 Pneumonia, unspecified organism: Secondary | ICD-10-CM | POA: Diagnosis not present

## 2016-11-09 DIAGNOSIS — E1140 Type 2 diabetes mellitus with diabetic neuropathy, unspecified: Secondary | ICD-10-CM | POA: Diagnosis not present

## 2016-11-09 DIAGNOSIS — Z7982 Long term (current) use of aspirin: Secondary | ICD-10-CM | POA: Diagnosis not present

## 2016-11-09 DIAGNOSIS — Z7952 Long term (current) use of systemic steroids: Secondary | ICD-10-CM | POA: Diagnosis not present

## 2016-11-09 DIAGNOSIS — D649 Anemia, unspecified: Secondary | ICD-10-CM | POA: Diagnosis not present

## 2016-11-09 DIAGNOSIS — E785 Hyperlipidemia, unspecified: Secondary | ICD-10-CM | POA: Diagnosis not present

## 2016-11-09 DIAGNOSIS — I5031 Acute diastolic (congestive) heart failure: Secondary | ICD-10-CM | POA: Diagnosis not present

## 2016-11-09 DIAGNOSIS — J441 Chronic obstructive pulmonary disease with (acute) exacerbation: Secondary | ICD-10-CM | POA: Diagnosis not present

## 2016-11-09 DIAGNOSIS — J440 Chronic obstructive pulmonary disease with acute lower respiratory infection: Secondary | ICD-10-CM | POA: Diagnosis not present

## 2016-11-12 DIAGNOSIS — I2510 Atherosclerotic heart disease of native coronary artery without angina pectoris: Secondary | ICD-10-CM | POA: Diagnosis not present

## 2016-11-12 DIAGNOSIS — J440 Chronic obstructive pulmonary disease with acute lower respiratory infection: Secondary | ICD-10-CM | POA: Diagnosis not present

## 2016-11-12 DIAGNOSIS — E1140 Type 2 diabetes mellitus with diabetic neuropathy, unspecified: Secondary | ICD-10-CM | POA: Diagnosis not present

## 2016-11-12 DIAGNOSIS — Z7952 Long term (current) use of systemic steroids: Secondary | ICD-10-CM | POA: Diagnosis not present

## 2016-11-12 DIAGNOSIS — I5031 Acute diastolic (congestive) heart failure: Secondary | ICD-10-CM | POA: Diagnosis not present

## 2016-11-12 DIAGNOSIS — D649 Anemia, unspecified: Secondary | ICD-10-CM | POA: Diagnosis not present

## 2016-11-12 DIAGNOSIS — E785 Hyperlipidemia, unspecified: Secondary | ICD-10-CM | POA: Diagnosis not present

## 2016-11-12 DIAGNOSIS — Z7982 Long term (current) use of aspirin: Secondary | ICD-10-CM | POA: Diagnosis not present

## 2016-11-12 DIAGNOSIS — J189 Pneumonia, unspecified organism: Secondary | ICD-10-CM | POA: Diagnosis not present

## 2016-11-12 DIAGNOSIS — J441 Chronic obstructive pulmonary disease with (acute) exacerbation: Secondary | ICD-10-CM | POA: Diagnosis not present

## 2016-11-13 DIAGNOSIS — I5031 Acute diastolic (congestive) heart failure: Secondary | ICD-10-CM | POA: Diagnosis not present

## 2016-11-13 DIAGNOSIS — J441 Chronic obstructive pulmonary disease with (acute) exacerbation: Secondary | ICD-10-CM | POA: Diagnosis not present

## 2016-11-13 DIAGNOSIS — Z7952 Long term (current) use of systemic steroids: Secondary | ICD-10-CM | POA: Diagnosis not present

## 2016-11-13 DIAGNOSIS — E1140 Type 2 diabetes mellitus with diabetic neuropathy, unspecified: Secondary | ICD-10-CM | POA: Diagnosis not present

## 2016-11-13 DIAGNOSIS — D649 Anemia, unspecified: Secondary | ICD-10-CM | POA: Diagnosis not present

## 2016-11-13 DIAGNOSIS — E785 Hyperlipidemia, unspecified: Secondary | ICD-10-CM | POA: Diagnosis not present

## 2016-11-13 DIAGNOSIS — J189 Pneumonia, unspecified organism: Secondary | ICD-10-CM | POA: Diagnosis not present

## 2016-11-13 DIAGNOSIS — I2510 Atherosclerotic heart disease of native coronary artery without angina pectoris: Secondary | ICD-10-CM | POA: Diagnosis not present

## 2016-11-13 DIAGNOSIS — Z7982 Long term (current) use of aspirin: Secondary | ICD-10-CM | POA: Diagnosis not present

## 2016-11-13 DIAGNOSIS — J440 Chronic obstructive pulmonary disease with acute lower respiratory infection: Secondary | ICD-10-CM | POA: Diagnosis not present

## 2016-11-15 DIAGNOSIS — J440 Chronic obstructive pulmonary disease with acute lower respiratory infection: Secondary | ICD-10-CM | POA: Diagnosis not present

## 2016-11-15 DIAGNOSIS — I2510 Atherosclerotic heart disease of native coronary artery without angina pectoris: Secondary | ICD-10-CM | POA: Diagnosis not present

## 2016-11-15 DIAGNOSIS — Z7982 Long term (current) use of aspirin: Secondary | ICD-10-CM | POA: Diagnosis not present

## 2016-11-15 DIAGNOSIS — J189 Pneumonia, unspecified organism: Secondary | ICD-10-CM | POA: Diagnosis not present

## 2016-11-15 DIAGNOSIS — J441 Chronic obstructive pulmonary disease with (acute) exacerbation: Secondary | ICD-10-CM | POA: Diagnosis not present

## 2016-11-15 DIAGNOSIS — E785 Hyperlipidemia, unspecified: Secondary | ICD-10-CM | POA: Diagnosis not present

## 2016-11-15 DIAGNOSIS — E1140 Type 2 diabetes mellitus with diabetic neuropathy, unspecified: Secondary | ICD-10-CM | POA: Diagnosis not present

## 2016-11-15 DIAGNOSIS — D649 Anemia, unspecified: Secondary | ICD-10-CM | POA: Diagnosis not present

## 2016-11-15 DIAGNOSIS — I5031 Acute diastolic (congestive) heart failure: Secondary | ICD-10-CM | POA: Diagnosis not present

## 2016-11-15 DIAGNOSIS — Z7952 Long term (current) use of systemic steroids: Secondary | ICD-10-CM | POA: Diagnosis not present

## 2016-11-16 DIAGNOSIS — Z7952 Long term (current) use of systemic steroids: Secondary | ICD-10-CM | POA: Diagnosis not present

## 2016-11-16 DIAGNOSIS — J440 Chronic obstructive pulmonary disease with acute lower respiratory infection: Secondary | ICD-10-CM | POA: Diagnosis not present

## 2016-11-16 DIAGNOSIS — Z09 Encounter for follow-up examination after completed treatment for conditions other than malignant neoplasm: Secondary | ICD-10-CM | POA: Diagnosis not present

## 2016-11-16 DIAGNOSIS — J9601 Acute respiratory failure with hypoxia: Secondary | ICD-10-CM | POA: Diagnosis not present

## 2016-11-16 DIAGNOSIS — I2510 Atherosclerotic heart disease of native coronary artery without angina pectoris: Secondary | ICD-10-CM | POA: Diagnosis not present

## 2016-11-16 DIAGNOSIS — E785 Hyperlipidemia, unspecified: Secondary | ICD-10-CM | POA: Diagnosis not present

## 2016-11-16 DIAGNOSIS — I5031 Acute diastolic (congestive) heart failure: Secondary | ICD-10-CM | POA: Diagnosis not present

## 2016-11-16 DIAGNOSIS — J441 Chronic obstructive pulmonary disease with (acute) exacerbation: Secondary | ICD-10-CM | POA: Diagnosis not present

## 2016-11-16 DIAGNOSIS — D649 Anemia, unspecified: Secondary | ICD-10-CM | POA: Diagnosis not present

## 2016-11-16 DIAGNOSIS — J449 Chronic obstructive pulmonary disease, unspecified: Secondary | ICD-10-CM | POA: Diagnosis not present

## 2016-11-16 DIAGNOSIS — Z7982 Long term (current) use of aspirin: Secondary | ICD-10-CM | POA: Diagnosis not present

## 2016-11-16 DIAGNOSIS — E1140 Type 2 diabetes mellitus with diabetic neuropathy, unspecified: Secondary | ICD-10-CM | POA: Diagnosis not present

## 2016-11-16 DIAGNOSIS — J189 Pneumonia, unspecified organism: Secondary | ICD-10-CM | POA: Diagnosis not present

## 2016-11-17 ENCOUNTER — Ambulatory Visit: Payer: Medicare Other | Attending: Pulmonary Disease | Admitting: Neurology

## 2016-11-17 DIAGNOSIS — Z7982 Long term (current) use of aspirin: Secondary | ICD-10-CM | POA: Insufficient documentation

## 2016-11-17 DIAGNOSIS — Z7984 Long term (current) use of oral hypoglycemic drugs: Secondary | ICD-10-CM | POA: Insufficient documentation

## 2016-11-17 DIAGNOSIS — G4733 Obstructive sleep apnea (adult) (pediatric): Secondary | ICD-10-CM | POA: Diagnosis not present

## 2016-11-17 DIAGNOSIS — G4730 Sleep apnea, unspecified: Secondary | ICD-10-CM

## 2016-11-17 DIAGNOSIS — R0689 Other abnormalities of breathing: Secondary | ICD-10-CM | POA: Insufficient documentation

## 2016-11-17 DIAGNOSIS — G4761 Periodic limb movement disorder: Secondary | ICD-10-CM | POA: Insufficient documentation

## 2016-11-17 DIAGNOSIS — Z79899 Other long term (current) drug therapy: Secondary | ICD-10-CM | POA: Insufficient documentation

## 2016-11-20 DIAGNOSIS — I5031 Acute diastolic (congestive) heart failure: Secondary | ICD-10-CM | POA: Diagnosis not present

## 2016-11-20 DIAGNOSIS — I2510 Atherosclerotic heart disease of native coronary artery without angina pectoris: Secondary | ICD-10-CM | POA: Diagnosis not present

## 2016-11-20 DIAGNOSIS — Z7982 Long term (current) use of aspirin: Secondary | ICD-10-CM | POA: Diagnosis not present

## 2016-11-20 DIAGNOSIS — E785 Hyperlipidemia, unspecified: Secondary | ICD-10-CM | POA: Diagnosis not present

## 2016-11-20 DIAGNOSIS — E1140 Type 2 diabetes mellitus with diabetic neuropathy, unspecified: Secondary | ICD-10-CM | POA: Diagnosis not present

## 2016-11-20 DIAGNOSIS — J440 Chronic obstructive pulmonary disease with acute lower respiratory infection: Secondary | ICD-10-CM | POA: Diagnosis not present

## 2016-11-20 DIAGNOSIS — Z7952 Long term (current) use of systemic steroids: Secondary | ICD-10-CM | POA: Diagnosis not present

## 2016-11-20 DIAGNOSIS — D649 Anemia, unspecified: Secondary | ICD-10-CM | POA: Diagnosis not present

## 2016-11-20 DIAGNOSIS — J189 Pneumonia, unspecified organism: Secondary | ICD-10-CM | POA: Diagnosis not present

## 2016-11-20 DIAGNOSIS — J441 Chronic obstructive pulmonary disease with (acute) exacerbation: Secondary | ICD-10-CM | POA: Diagnosis not present

## 2016-11-22 DIAGNOSIS — I2510 Atherosclerotic heart disease of native coronary artery without angina pectoris: Secondary | ICD-10-CM | POA: Diagnosis not present

## 2016-11-22 DIAGNOSIS — Z7952 Long term (current) use of systemic steroids: Secondary | ICD-10-CM | POA: Diagnosis not present

## 2016-11-22 DIAGNOSIS — E1140 Type 2 diabetes mellitus with diabetic neuropathy, unspecified: Secondary | ICD-10-CM | POA: Diagnosis not present

## 2016-11-22 DIAGNOSIS — I5031 Acute diastolic (congestive) heart failure: Secondary | ICD-10-CM | POA: Diagnosis not present

## 2016-11-22 DIAGNOSIS — E785 Hyperlipidemia, unspecified: Secondary | ICD-10-CM | POA: Diagnosis not present

## 2016-11-22 DIAGNOSIS — J441 Chronic obstructive pulmonary disease with (acute) exacerbation: Secondary | ICD-10-CM | POA: Diagnosis not present

## 2016-11-22 DIAGNOSIS — J440 Chronic obstructive pulmonary disease with acute lower respiratory infection: Secondary | ICD-10-CM | POA: Diagnosis not present

## 2016-11-22 DIAGNOSIS — Z7982 Long term (current) use of aspirin: Secondary | ICD-10-CM | POA: Diagnosis not present

## 2016-11-22 DIAGNOSIS — D649 Anemia, unspecified: Secondary | ICD-10-CM | POA: Diagnosis not present

## 2016-11-22 DIAGNOSIS — J189 Pneumonia, unspecified organism: Secondary | ICD-10-CM | POA: Diagnosis not present

## 2016-11-27 DIAGNOSIS — E785 Hyperlipidemia, unspecified: Secondary | ICD-10-CM | POA: Diagnosis not present

## 2016-11-27 DIAGNOSIS — Z7952 Long term (current) use of systemic steroids: Secondary | ICD-10-CM | POA: Diagnosis not present

## 2016-11-27 DIAGNOSIS — Z7982 Long term (current) use of aspirin: Secondary | ICD-10-CM | POA: Diagnosis not present

## 2016-11-27 DIAGNOSIS — E1140 Type 2 diabetes mellitus with diabetic neuropathy, unspecified: Secondary | ICD-10-CM | POA: Diagnosis not present

## 2016-11-27 DIAGNOSIS — D649 Anemia, unspecified: Secondary | ICD-10-CM | POA: Diagnosis not present

## 2016-11-27 DIAGNOSIS — J441 Chronic obstructive pulmonary disease with (acute) exacerbation: Secondary | ICD-10-CM | POA: Diagnosis not present

## 2016-11-27 DIAGNOSIS — I5031 Acute diastolic (congestive) heart failure: Secondary | ICD-10-CM | POA: Diagnosis not present

## 2016-11-27 DIAGNOSIS — J440 Chronic obstructive pulmonary disease with acute lower respiratory infection: Secondary | ICD-10-CM | POA: Diagnosis not present

## 2016-11-27 DIAGNOSIS — I2510 Atherosclerotic heart disease of native coronary artery without angina pectoris: Secondary | ICD-10-CM | POA: Diagnosis not present

## 2016-11-27 DIAGNOSIS — J189 Pneumonia, unspecified organism: Secondary | ICD-10-CM | POA: Diagnosis not present

## 2016-11-28 DIAGNOSIS — J441 Chronic obstructive pulmonary disease with (acute) exacerbation: Secondary | ICD-10-CM | POA: Diagnosis not present

## 2016-11-28 DIAGNOSIS — Z7952 Long term (current) use of systemic steroids: Secondary | ICD-10-CM | POA: Diagnosis not present

## 2016-11-28 DIAGNOSIS — E1140 Type 2 diabetes mellitus with diabetic neuropathy, unspecified: Secondary | ICD-10-CM | POA: Diagnosis not present

## 2016-11-28 DIAGNOSIS — Z7982 Long term (current) use of aspirin: Secondary | ICD-10-CM | POA: Diagnosis not present

## 2016-11-28 DIAGNOSIS — J9611 Chronic respiratory failure with hypoxia: Secondary | ICD-10-CM | POA: Diagnosis not present

## 2016-11-28 DIAGNOSIS — E785 Hyperlipidemia, unspecified: Secondary | ICD-10-CM | POA: Diagnosis not present

## 2016-11-28 DIAGNOSIS — I2510 Atherosclerotic heart disease of native coronary artery without angina pectoris: Secondary | ICD-10-CM | POA: Diagnosis not present

## 2016-11-28 DIAGNOSIS — J440 Chronic obstructive pulmonary disease with acute lower respiratory infection: Secondary | ICD-10-CM | POA: Diagnosis not present

## 2016-11-28 DIAGNOSIS — J189 Pneumonia, unspecified organism: Secondary | ICD-10-CM | POA: Diagnosis not present

## 2016-11-28 DIAGNOSIS — J449 Chronic obstructive pulmonary disease, unspecified: Secondary | ICD-10-CM | POA: Diagnosis not present

## 2016-11-28 DIAGNOSIS — I5031 Acute diastolic (congestive) heart failure: Secondary | ICD-10-CM | POA: Diagnosis not present

## 2016-11-28 DIAGNOSIS — D649 Anemia, unspecified: Secondary | ICD-10-CM | POA: Diagnosis not present

## 2016-11-29 DIAGNOSIS — D649 Anemia, unspecified: Secondary | ICD-10-CM | POA: Diagnosis not present

## 2016-11-29 DIAGNOSIS — Z7952 Long term (current) use of systemic steroids: Secondary | ICD-10-CM | POA: Diagnosis not present

## 2016-11-29 DIAGNOSIS — J440 Chronic obstructive pulmonary disease with acute lower respiratory infection: Secondary | ICD-10-CM | POA: Diagnosis not present

## 2016-11-29 DIAGNOSIS — J189 Pneumonia, unspecified organism: Secondary | ICD-10-CM | POA: Diagnosis not present

## 2016-11-29 DIAGNOSIS — E1140 Type 2 diabetes mellitus with diabetic neuropathy, unspecified: Secondary | ICD-10-CM | POA: Diagnosis not present

## 2016-11-29 DIAGNOSIS — Z7982 Long term (current) use of aspirin: Secondary | ICD-10-CM | POA: Diagnosis not present

## 2016-11-29 DIAGNOSIS — I5031 Acute diastolic (congestive) heart failure: Secondary | ICD-10-CM | POA: Diagnosis not present

## 2016-11-29 DIAGNOSIS — E785 Hyperlipidemia, unspecified: Secondary | ICD-10-CM | POA: Diagnosis not present

## 2016-11-29 DIAGNOSIS — J441 Chronic obstructive pulmonary disease with (acute) exacerbation: Secondary | ICD-10-CM | POA: Diagnosis not present

## 2016-11-29 DIAGNOSIS — I2510 Atherosclerotic heart disease of native coronary artery without angina pectoris: Secondary | ICD-10-CM | POA: Diagnosis not present

## 2016-11-29 NOTE — Procedures (Signed)
[place]oll[place] A. Merlene Laughter, MD     [email or web address]             NOCTURNAL POLYSOMNOGRAPHY   LOCATION: [facility]   Patient Name: [name], [name] Date: 11/17/2016 Gender: Female D.O.B: 11-20-1933 Age (years): 89 Referring Provider: Lennox Solders Height (inches): 60 Interpreting Physician: Phillips Odor MD, ABSM Weight (lbs): 171 RPSGT: Earney Hamburg BMI: 33 MRN: [id number] Neck Size: 19.00 CLINICAL INFORMATION Sleep Study Type: NPSG  Indication for sleep study: N/A  Epworth Sleepiness Score: 2  SLEEP STUDY TECHNIQUE As per the AASM Manual for the Scoring of Sleep and Associated Events v2.3 (April 2016) with a hypopnea requiring 4% desaturations.  The channels recorded and monitored were frontal, central and occipital EEG, electrooculogram (EOG), submentalis EMG (chin), nasal and oral airflow, thoracic and abdominal wall motion, anterior tibialis EMG, snore microphone, electrocardiogram, and pulse oximetry.  MEDICATIONS Medications self-administered by patient taken the night of the study : OTC MELATONIN, GABAPENTIN, BENYDRYL  Current Outpatient Prescriptions:  .  albuterol (PROVENTIL) (2.5 MG/3ML) 0.083% nebulizer solution, Take 3 mLs (2.5 mg total) by nebulization every 6 (six) hours as needed for wheezing or shortness of breath. ICD 10 code J44.9, Disp: 75 mL, Rfl: 12 .  aspirin EC 81 MG tablet, Take 81 mg by mouth daily., Disp: , Rfl:  .  Choline Fenofibrate 135 MG capsule, Take 135 mg by mouth daily., Disp: , Rfl:  .  dorzolamide-timolol (COSOPT) 22.3-6.8 MG/ML ophthalmic solution, Place 1 drop into both eyes 2 (two) times daily. , Disp: , Rfl:  .  gabapentin (NEURONTIN) 100 MG capsule, Take 100 mg by mouth 3 (three) times daily. , Disp: , Rfl:  .  latanoprost (XALATAN) 0.005 % ophthalmic solution, Place 1 drop into both eyes at bedtime., Disp: , Rfl:  .  magnesium hydroxide (MILK OF MAGNESIA) 400 MG/5ML suspension, Take 30 mLs by mouth  every other day., Disp: , Rfl:  .  Melatonin 5 MG TABS, Take 5 mg by mouth at bedtime., Disp: , Rfl:  .  Milnacipran (SAVELLA) 50 MG TABS, Take 50 mg by mouth 2 (two) times daily., Disp: , Rfl:  .  mometasone-formoterol (DULERA) 100-5 MCG/ACT AERO, Inhale 2 puffs into the lungs 2 (two) times daily., Disp: 1 Inhaler, Rfl: 0 .  Multiple Vitamins-Minerals (MULTIVITAMIN WITH MINERALS) tablet, Take 1 tablet by mouth every evening. , Disp: , Rfl:  .  nitroGLYCERIN (NITROSTAT) 0.4 MG SL tablet, Place 1 tablet (0.4 mg total) under the tongue every 5 (five) minutes as needed for chest pain., Disp: 25 tablet, Rfl: 3 .  pantoprazole (PROTONIX) 40 MG tablet, Take 40 mg by mouth daily. , Disp: , Rfl:  .  pioglitazone (ACTOS) 15 MG tablet, Take 15 mg by mouth daily. , Disp: , Rfl:  .  predniSONE (DELTASONE) 10 MG tablet, Take 30mg  daily for 1 day then 20mg  daily for 1 day then 10mg  daily for 1 day then stop, Disp: 12 tablet, Rfl: 0 .  rosuvastatin (CRESTOR) 40 MG tablet, Take 40 mg by mouth every evening. , Disp: , Rfl:  .  sitaGLIPtan-metformin (JANUMET) 50-1000 MG per tablet, Take 1 tablet by mouth 2 (two) times daily with a meal., Disp: , Rfl:    SLEEP ARCHITECTURE The study was initiated at 9:24:28 PM and ended at 4:37:32 AM.  Sleep onset time was 42.8 minutes and the sleep efficiency was 68.8%. The total sleep time was 298.0 minutes.  Stage REM latency was 341.5 minutes.  The patient spent 6.88% of  the night in stage N1 sleep, 87.92% in stage N2 sleep, 2.18% in stage N3 and 3.02% in REM.  Alpha intrusion was absent.  Supine sleep was 1.01%.  RESPIRATORY PARAMETERS The overall apnea/hypopnea index (AHI) was 0.0 per hour. There were 0 total apneas, including 0 obstructive, 0 central and 0 mixed apneas. There were 0 hypopneas and 2 RERAs.  The AHI during Stage REM sleep was 0.0 per hour.  AHI while supine was 0.0 per hour.  The mean oxygen saturation was 95.72%. The minimum SpO2 during sleep was  80.00%. These desaturations were not associated with apneic or hypopneic events.   Moderate snoring was noted during this study.     CARDIAC DATA The 2 lead EKG demonstrated sinus rhythm. The mean heart rate was 99.20 beats per minute. Other EKG findings include: None. LEG MOVEMENT DATA The total PLMS were 248 with a resulting PLMS index of 49.93. Associated arousal with leg movement index was 24.2.    IMPRESSIONS - Hypoventilation syndrome is observed. Supplemental nocturnal oxygen 2 is recommended.  - Severe periodic limb movements of sleep occurred during the study. Associated arousals were significant. Consider dopamine agonist such as requip or mirapex.  Delano Metz, MD Diplomate, American Board of Sleep Medicine.

## 2016-11-30 DIAGNOSIS — J440 Chronic obstructive pulmonary disease with acute lower respiratory infection: Secondary | ICD-10-CM | POA: Diagnosis not present

## 2016-11-30 DIAGNOSIS — J441 Chronic obstructive pulmonary disease with (acute) exacerbation: Secondary | ICD-10-CM | POA: Diagnosis not present

## 2016-11-30 DIAGNOSIS — J189 Pneumonia, unspecified organism: Secondary | ICD-10-CM | POA: Diagnosis not present

## 2016-11-30 DIAGNOSIS — E1140 Type 2 diabetes mellitus with diabetic neuropathy, unspecified: Secondary | ICD-10-CM | POA: Diagnosis not present

## 2016-11-30 DIAGNOSIS — Z7982 Long term (current) use of aspirin: Secondary | ICD-10-CM | POA: Diagnosis not present

## 2016-11-30 DIAGNOSIS — D649 Anemia, unspecified: Secondary | ICD-10-CM | POA: Diagnosis not present

## 2016-11-30 DIAGNOSIS — Z7952 Long term (current) use of systemic steroids: Secondary | ICD-10-CM | POA: Diagnosis not present

## 2016-11-30 DIAGNOSIS — I5031 Acute diastolic (congestive) heart failure: Secondary | ICD-10-CM | POA: Diagnosis not present

## 2016-11-30 DIAGNOSIS — I2510 Atherosclerotic heart disease of native coronary artery without angina pectoris: Secondary | ICD-10-CM | POA: Diagnosis not present

## 2016-11-30 DIAGNOSIS — E785 Hyperlipidemia, unspecified: Secondary | ICD-10-CM | POA: Diagnosis not present

## 2016-12-01 ENCOUNTER — Other Ambulatory Visit (HOSPITAL_COMMUNITY): Payer: Self-pay | Admitting: Internal Medicine

## 2016-12-01 ENCOUNTER — Ambulatory Visit (HOSPITAL_COMMUNITY)
Admission: RE | Admit: 2016-12-01 | Discharge: 2016-12-01 | Disposition: A | Discharge status: Home / Self Care | Payer: Medicare Other | Source: Ambulatory Visit | Attending: Internal Medicine | Admitting: Internal Medicine

## 2016-12-01 ENCOUNTER — Ambulatory Visit (HOSPITAL_COMMUNITY)
Admit: 2016-12-01 | Discharge: 2016-12-01 | Disposition: A | Discharge status: Home / Self Care | Payer: Medicare Other | Source: Ambulatory Visit | Attending: Internal Medicine | Admitting: Internal Medicine

## 2016-12-01 DIAGNOSIS — R079 Chest pain, unspecified: Secondary | ICD-10-CM | POA: Insufficient documentation

## 2016-12-01 DIAGNOSIS — R05 Cough: Secondary | ICD-10-CM | POA: Diagnosis not present

## 2016-12-03 NOTE — Progress Notes (Signed)
NAME:  [name], [name]                  ACCOUNT NO.:  MEDICAL RECORD NO.:  [id number]  LOCATION:                                 FACILITY:  PHYSICIAN:  [name], M.D.DATE OF BIRTH:  05-Nov-1933  DATE OF PROCEDURE:  10/31/2016 DATE OF DISCHARGE:                                PROGRESS NOTE   SUBJECTIVE:  This is an 82 year old with multiple medical problems including coronary artery occlusive disease, status post bypass grafting, depression, hypertension, diabetes, COPD, and who had developed cough, congestion, green sputum, and shortness of breath.  She went to her primary care physician, who ordered chest x-ray, which did show right middle lobe pneumonia.  She was directed to the emergency department.  She initially was started on treatment for community- acquired pneumonia, and she initially did well, but then on the 11th, she developed respiratory distress and marked hypercapnia.  She was transferred to step-down unit, started on BiPAP, given Lasix and treated for COPD.  This morning, she is awake and alert, but on BiPAP.  She has been using BiPAP overnight.  She still looks a little bit dyspneic.  She is not having any chest pain.  She did have some orthopnea last night. Oxygen saturations in the 90s.  PAST HISTORIES:  I reviewed her past medical history, family, and social history again.  MEDICATIONS:  Reviewed.  INTAKE AND OUTPUT:  Reviewed, and it is noted that her weight has gone down by about a kilogram.  PHYSICAL EXAMINATION:  CONSTITUTIONAL:  She is awake and alert, but she is on BiPAP.  This is going to be discontinued to see if she can eat breakfast. EYES:  Pupils react. EARS, NOSE, MOUTH, AND THROAT:  Her mucous membranes are moist. CARDIOVASCULAR:  Her heart is regular.  No gallop.  She has no edema. RESPIRATORY:  Her respiratory effort is per the BiPAP machine.  She still has some crackles in the bases and some rhonchi bilaterally. GASTROINTESTINAL:   Her abdomen is soft, obese, nontender. SKIN:  No rash.  Her skin is warm and dry. NEUROLOGICAL:  No focal abnormalities.  ASSESSMENT:  She continues to have problems with breathing.  She has acute on chronic respiratory failure.  Based on her lab work, I think she probably has chronic hypercapnic respiratory failure, although she had not had a blood gas done prior to her admission.  She has done okay with BiPAP, but she is still pretty high risk for needing intubated and mechanical ventilation because of her elevated pCO2.  I think she also has a little bit still of the acute on chronic diastolic heart failure, but I discussed with her hospitalist attending, she is going to get some more Lasix.  Remain in step-down for now.     [name], M.D.     ELH/MEDQ  D:  11/30/2016  T:  12/01/2016  Job:  [id number]

## 2016-12-04 DIAGNOSIS — I5031 Acute diastolic (congestive) heart failure: Secondary | ICD-10-CM | POA: Diagnosis not present

## 2016-12-04 DIAGNOSIS — E1140 Type 2 diabetes mellitus with diabetic neuropathy, unspecified: Secondary | ICD-10-CM | POA: Diagnosis not present

## 2016-12-04 DIAGNOSIS — J441 Chronic obstructive pulmonary disease with (acute) exacerbation: Secondary | ICD-10-CM | POA: Diagnosis not present

## 2016-12-04 DIAGNOSIS — J189 Pneumonia, unspecified organism: Secondary | ICD-10-CM | POA: Diagnosis not present

## 2016-12-04 DIAGNOSIS — E785 Hyperlipidemia, unspecified: Secondary | ICD-10-CM | POA: Diagnosis not present

## 2016-12-04 DIAGNOSIS — D649 Anemia, unspecified: Secondary | ICD-10-CM | POA: Diagnosis not present

## 2016-12-04 DIAGNOSIS — Z7982 Long term (current) use of aspirin: Secondary | ICD-10-CM | POA: Diagnosis not present

## 2016-12-04 DIAGNOSIS — I2510 Atherosclerotic heart disease of native coronary artery without angina pectoris: Secondary | ICD-10-CM | POA: Diagnosis not present

## 2016-12-04 DIAGNOSIS — J440 Chronic obstructive pulmonary disease with acute lower respiratory infection: Secondary | ICD-10-CM | POA: Diagnosis not present

## 2016-12-04 DIAGNOSIS — J449 Chronic obstructive pulmonary disease, unspecified: Secondary | ICD-10-CM | POA: Diagnosis not present

## 2016-12-04 DIAGNOSIS — Z7952 Long term (current) use of systemic steroids: Secondary | ICD-10-CM | POA: Diagnosis not present

## 2016-12-05 DIAGNOSIS — D649 Anemia, unspecified: Secondary | ICD-10-CM | POA: Diagnosis not present

## 2016-12-05 DIAGNOSIS — I5031 Acute diastolic (congestive) heart failure: Secondary | ICD-10-CM | POA: Diagnosis not present

## 2016-12-05 DIAGNOSIS — J441 Chronic obstructive pulmonary disease with (acute) exacerbation: Secondary | ICD-10-CM | POA: Diagnosis not present

## 2016-12-05 DIAGNOSIS — J440 Chronic obstructive pulmonary disease with acute lower respiratory infection: Secondary | ICD-10-CM | POA: Diagnosis not present

## 2016-12-05 DIAGNOSIS — E1140 Type 2 diabetes mellitus with diabetic neuropathy, unspecified: Secondary | ICD-10-CM | POA: Diagnosis not present

## 2016-12-05 DIAGNOSIS — Z7982 Long term (current) use of aspirin: Secondary | ICD-10-CM | POA: Diagnosis not present

## 2016-12-05 DIAGNOSIS — Z7952 Long term (current) use of systemic steroids: Secondary | ICD-10-CM | POA: Diagnosis not present

## 2016-12-05 DIAGNOSIS — I2510 Atherosclerotic heart disease of native coronary artery without angina pectoris: Secondary | ICD-10-CM | POA: Diagnosis not present

## 2016-12-05 DIAGNOSIS — E785 Hyperlipidemia, unspecified: Secondary | ICD-10-CM | POA: Diagnosis not present

## 2016-12-05 DIAGNOSIS — J189 Pneumonia, unspecified organism: Secondary | ICD-10-CM | POA: Diagnosis not present

## 2016-12-06 DIAGNOSIS — Z7982 Long term (current) use of aspirin: Secondary | ICD-10-CM | POA: Diagnosis not present

## 2016-12-06 DIAGNOSIS — Z7952 Long term (current) use of systemic steroids: Secondary | ICD-10-CM | POA: Diagnosis not present

## 2016-12-06 DIAGNOSIS — I2510 Atherosclerotic heart disease of native coronary artery without angina pectoris: Secondary | ICD-10-CM | POA: Diagnosis not present

## 2016-12-06 DIAGNOSIS — J441 Chronic obstructive pulmonary disease with (acute) exacerbation: Secondary | ICD-10-CM | POA: Diagnosis not present

## 2016-12-06 DIAGNOSIS — J440 Chronic obstructive pulmonary disease with acute lower respiratory infection: Secondary | ICD-10-CM | POA: Diagnosis not present

## 2016-12-06 DIAGNOSIS — D649 Anemia, unspecified: Secondary | ICD-10-CM | POA: Diagnosis not present

## 2016-12-06 DIAGNOSIS — E1140 Type 2 diabetes mellitus with diabetic neuropathy, unspecified: Secondary | ICD-10-CM | POA: Diagnosis not present

## 2016-12-06 DIAGNOSIS — E785 Hyperlipidemia, unspecified: Secondary | ICD-10-CM | POA: Diagnosis not present

## 2016-12-06 DIAGNOSIS — J189 Pneumonia, unspecified organism: Secondary | ICD-10-CM | POA: Diagnosis not present

## 2016-12-06 DIAGNOSIS — I5031 Acute diastolic (congestive) heart failure: Secondary | ICD-10-CM | POA: Diagnosis not present

## 2016-12-11 DIAGNOSIS — D649 Anemia, unspecified: Secondary | ICD-10-CM | POA: Diagnosis not present

## 2016-12-11 DIAGNOSIS — E785 Hyperlipidemia, unspecified: Secondary | ICD-10-CM | POA: Diagnosis not present

## 2016-12-11 DIAGNOSIS — Z7982 Long term (current) use of aspirin: Secondary | ICD-10-CM | POA: Diagnosis not present

## 2016-12-11 DIAGNOSIS — I2510 Atherosclerotic heart disease of native coronary artery without angina pectoris: Secondary | ICD-10-CM | POA: Diagnosis not present

## 2016-12-11 DIAGNOSIS — Z7952 Long term (current) use of systemic steroids: Secondary | ICD-10-CM | POA: Diagnosis not present

## 2016-12-11 DIAGNOSIS — E1140 Type 2 diabetes mellitus with diabetic neuropathy, unspecified: Secondary | ICD-10-CM | POA: Diagnosis not present

## 2016-12-11 DIAGNOSIS — J441 Chronic obstructive pulmonary disease with (acute) exacerbation: Secondary | ICD-10-CM | POA: Diagnosis not present

## 2016-12-11 DIAGNOSIS — I5031 Acute diastolic (congestive) heart failure: Secondary | ICD-10-CM | POA: Diagnosis not present

## 2016-12-11 DIAGNOSIS — J440 Chronic obstructive pulmonary disease with acute lower respiratory infection: Secondary | ICD-10-CM | POA: Diagnosis not present

## 2016-12-11 DIAGNOSIS — J189 Pneumonia, unspecified organism: Secondary | ICD-10-CM | POA: Diagnosis not present

## 2016-12-13 DIAGNOSIS — H401132 Primary open-angle glaucoma, bilateral, moderate stage: Secondary | ICD-10-CM | POA: Diagnosis not present

## 2016-12-13 DIAGNOSIS — H40053 Ocular hypertension, bilateral: Secondary | ICD-10-CM | POA: Diagnosis not present

## 2016-12-13 DIAGNOSIS — Z961 Presence of intraocular lens: Secondary | ICD-10-CM | POA: Diagnosis not present

## 2016-12-21 DIAGNOSIS — D509 Iron deficiency anemia, unspecified: Secondary | ICD-10-CM | POA: Diagnosis not present

## 2016-12-21 DIAGNOSIS — E782 Mixed hyperlipidemia: Secondary | ICD-10-CM | POA: Diagnosis not present

## 2016-12-21 DIAGNOSIS — E119 Type 2 diabetes mellitus without complications: Secondary | ICD-10-CM | POA: Diagnosis not present

## 2016-12-24 DIAGNOSIS — J189 Pneumonia, unspecified organism: Secondary | ICD-10-CM | POA: Diagnosis not present

## 2016-12-24 DIAGNOSIS — J441 Chronic obstructive pulmonary disease with (acute) exacerbation: Secondary | ICD-10-CM | POA: Diagnosis not present

## 2016-12-24 DIAGNOSIS — J440 Chronic obstructive pulmonary disease with acute lower respiratory infection: Secondary | ICD-10-CM | POA: Diagnosis not present

## 2016-12-24 DIAGNOSIS — Z7952 Long term (current) use of systemic steroids: Secondary | ICD-10-CM | POA: Diagnosis not present

## 2016-12-24 DIAGNOSIS — I5031 Acute diastolic (congestive) heart failure: Secondary | ICD-10-CM | POA: Diagnosis not present

## 2016-12-24 DIAGNOSIS — D649 Anemia, unspecified: Secondary | ICD-10-CM | POA: Diagnosis not present

## 2016-12-24 DIAGNOSIS — E785 Hyperlipidemia, unspecified: Secondary | ICD-10-CM | POA: Diagnosis not present

## 2016-12-24 DIAGNOSIS — Z7982 Long term (current) use of aspirin: Secondary | ICD-10-CM | POA: Diagnosis not present

## 2016-12-24 DIAGNOSIS — E1140 Type 2 diabetes mellitus with diabetic neuropathy, unspecified: Secondary | ICD-10-CM | POA: Diagnosis not present

## 2016-12-24 DIAGNOSIS — I2510 Atherosclerotic heart disease of native coronary artery without angina pectoris: Secondary | ICD-10-CM | POA: Diagnosis not present

## 2016-12-26 DIAGNOSIS — D509 Iron deficiency anemia, unspecified: Secondary | ICD-10-CM | POA: Diagnosis not present

## 2016-12-26 DIAGNOSIS — I10 Essential (primary) hypertension: Secondary | ICD-10-CM | POA: Diagnosis not present

## 2016-12-26 DIAGNOSIS — I259 Chronic ischemic heart disease, unspecified: Secondary | ICD-10-CM | POA: Diagnosis not present

## 2016-12-26 DIAGNOSIS — Z0001 Encounter for general adult medical examination with abnormal findings: Secondary | ICD-10-CM | POA: Diagnosis not present

## 2016-12-26 DIAGNOSIS — E782 Mixed hyperlipidemia: Secondary | ICD-10-CM | POA: Diagnosis not present

## 2016-12-26 DIAGNOSIS — E119 Type 2 diabetes mellitus without complications: Secondary | ICD-10-CM | POA: Diagnosis not present

## 2016-12-31 DIAGNOSIS — H401112 Primary open-angle glaucoma, right eye, moderate stage: Secondary | ICD-10-CM | POA: Diagnosis not present

## 2017-01-04 DIAGNOSIS — J449 Chronic obstructive pulmonary disease, unspecified: Secondary | ICD-10-CM | POA: Diagnosis not present

## 2017-01-07 DIAGNOSIS — E119 Type 2 diabetes mellitus without complications: Secondary | ICD-10-CM | POA: Diagnosis not present

## 2017-01-09 DIAGNOSIS — G2581 Restless legs syndrome: Secondary | ICD-10-CM | POA: Diagnosis not present

## 2017-01-09 DIAGNOSIS — J449 Chronic obstructive pulmonary disease, unspecified: Secondary | ICD-10-CM | POA: Diagnosis not present

## 2017-01-09 DIAGNOSIS — J9611 Chronic respiratory failure with hypoxia: Secondary | ICD-10-CM | POA: Diagnosis not present

## 2017-01-10 ENCOUNTER — Ambulatory Visit (HOSPITAL_COMMUNITY)
Admission: RE | Admit: 2017-01-10 | Discharge: 2017-01-10 | Disposition: A | Discharge status: Home / Self Care | Payer: Medicare Other | Source: Ambulatory Visit | Attending: Pulmonary Disease | Admitting: Pulmonary Disease

## 2017-01-10 DIAGNOSIS — J449 Chronic obstructive pulmonary disease, unspecified: Secondary | ICD-10-CM | POA: Diagnosis not present

## 2017-01-10 DIAGNOSIS — D509 Iron deficiency anemia, unspecified: Secondary | ICD-10-CM | POA: Diagnosis not present

## 2017-01-10 DIAGNOSIS — J9611 Chronic respiratory failure with hypoxia: Secondary | ICD-10-CM | POA: Diagnosis not present

## 2017-01-10 LAB — BLOOD GAS, ARTERIAL
ACID-BASE EXCESS: 6.3 mmol/L — AB (ref 0.0–2.0)
Bicarbonate: 29.1 mmol/L — ABNORMAL HIGH (ref 20.0–28.0)
DRAWN BY: 25788
O2 Content: 2 L/min
O2 Saturation: 90.6 %
PH ART: 7.3 — AB (ref 7.350–7.450)
Patient temperature: 37
pCO2 arterial: 68 mmHg (ref 32.0–48.0)
pO2, Arterial: 67 mmHg — ABNORMAL LOW (ref 83.0–108.0)

## 2017-01-17 ENCOUNTER — Encounter (HOSPITAL_COMMUNITY)
Admission: RE | Admit: 2017-01-17 | Discharge: 2017-01-17 | Disposition: A | Discharge status: Home / Self Care | Payer: Medicare Other | Source: Ambulatory Visit | Attending: Internal Medicine | Admitting: Internal Medicine

## 2017-01-17 DIAGNOSIS — I2510 Atherosclerotic heart disease of native coronary artery without angina pectoris: Secondary | ICD-10-CM | POA: Diagnosis not present

## 2017-01-17 DIAGNOSIS — J9611 Chronic respiratory failure with hypoxia: Secondary | ICD-10-CM | POA: Diagnosis not present

## 2017-01-17 DIAGNOSIS — G2581 Restless legs syndrome: Secondary | ICD-10-CM | POA: Diagnosis not present

## 2017-01-17 DIAGNOSIS — J449 Chronic obstructive pulmonary disease, unspecified: Secondary | ICD-10-CM | POA: Diagnosis not present

## 2017-01-17 DIAGNOSIS — D509 Iron deficiency anemia, unspecified: Secondary | ICD-10-CM | POA: Insufficient documentation

## 2017-01-17 MED ORDER — SODIUM CHLORIDE 0.9 % IV SOLN
750.0000 mg | Freq: Once | INTRAVENOUS | Status: AC
  Administered 2017-01-17: 750 mg via INTRAVENOUS
  Filled 2017-01-17: qty 15

## 2017-01-17 MED ORDER — SODIUM CHLORIDE 0.9 % IV SOLN
INTRAVENOUS | Status: DC
  Administered 2017-01-17: 250 mL via INTRAVENOUS

## 2017-01-18 ENCOUNTER — Encounter (HOSPITAL_COMMUNITY): Payer: Medicare Other

## 2017-01-24 ENCOUNTER — Encounter (HOSPITAL_COMMUNITY)
Admission: RE | Admit: 2017-01-24 | Discharge: 2017-01-24 | Disposition: A | Discharge status: Home / Self Care | Payer: Medicare Other | Source: Ambulatory Visit | Attending: Internal Medicine | Admitting: Internal Medicine

## 2017-01-24 DIAGNOSIS — D509 Iron deficiency anemia, unspecified: Secondary | ICD-10-CM | POA: Diagnosis not present

## 2017-01-24 MED ORDER — SODIUM CHLORIDE 0.9 % IV SOLN
Freq: Once | INTRAVENOUS | Status: AC
  Administered 2017-01-24: 250 mL via INTRAVENOUS

## 2017-01-24 MED ORDER — SODIUM CHLORIDE 0.9 % IV SOLN
750.0000 mg | Freq: Once | INTRAVENOUS | Status: AC
  Administered 2017-01-24: 750 mg via INTRAVENOUS
  Filled 2017-01-24: qty 15

## 2017-01-30 DIAGNOSIS — H401122 Primary open-angle glaucoma, left eye, moderate stage: Secondary | ICD-10-CM | POA: Diagnosis not present

## 2017-02-04 DIAGNOSIS — J449 Chronic obstructive pulmonary disease, unspecified: Secondary | ICD-10-CM | POA: Diagnosis not present

## 2017-02-24 ENCOUNTER — Emergency Department (HOSPITAL_COMMUNITY): Payer: Medicare Other

## 2017-02-24 ENCOUNTER — Emergency Department (HOSPITAL_COMMUNITY)
Admission: EM | Admit: 2017-02-24 | Discharge: 2017-02-24 | Disposition: A | Discharge status: Home / Self Care | Payer: Medicare Other | Attending: Emergency Medicine | Admitting: Emergency Medicine

## 2017-02-24 ENCOUNTER — Encounter (HOSPITAL_COMMUNITY): Payer: Self-pay | Admitting: Emergency Medicine

## 2017-02-24 DIAGNOSIS — Z79899 Other long term (current) drug therapy: Secondary | ICD-10-CM | POA: Diagnosis not present

## 2017-02-24 DIAGNOSIS — J449 Chronic obstructive pulmonary disease, unspecified: Secondary | ICD-10-CM | POA: Insufficient documentation

## 2017-02-24 DIAGNOSIS — M545 Low back pain, unspecified: Secondary | ICD-10-CM

## 2017-02-24 DIAGNOSIS — I110 Hypertensive heart disease with heart failure: Secondary | ICD-10-CM | POA: Insufficient documentation

## 2017-02-24 DIAGNOSIS — I2510 Atherosclerotic heart disease of native coronary artery without angina pectoris: Secondary | ICD-10-CM | POA: Diagnosis not present

## 2017-02-24 DIAGNOSIS — E119 Type 2 diabetes mellitus without complications: Secondary | ICD-10-CM | POA: Diagnosis not present

## 2017-02-24 DIAGNOSIS — Z951 Presence of aortocoronary bypass graft: Secondary | ICD-10-CM | POA: Diagnosis not present

## 2017-02-24 DIAGNOSIS — I5031 Acute diastolic (congestive) heart failure: Secondary | ICD-10-CM | POA: Diagnosis not present

## 2017-02-24 DIAGNOSIS — M25551 Pain in right hip: Secondary | ICD-10-CM | POA: Diagnosis not present

## 2017-02-24 DIAGNOSIS — Z87891 Personal history of nicotine dependence: Secondary | ICD-10-CM | POA: Insufficient documentation

## 2017-02-24 DIAGNOSIS — Z7982 Long term (current) use of aspirin: Secondary | ICD-10-CM | POA: Diagnosis not present

## 2017-02-24 DIAGNOSIS — Z7984 Long term (current) use of oral hypoglycemic drugs: Secondary | ICD-10-CM | POA: Insufficient documentation

## 2017-02-24 LAB — URINALYSIS, ROUTINE W REFLEX MICROSCOPIC
BILIRUBIN URINE: NEGATIVE
Glucose, UA: NEGATIVE mg/dL
Hgb urine dipstick: NEGATIVE
Ketones, ur: NEGATIVE mg/dL
Leukocytes, UA: NEGATIVE
NITRITE: NEGATIVE
PH: 7 (ref 5.0–8.0)
Protein, ur: NEGATIVE mg/dL
SPECIFIC GRAVITY, URINE: 1.017 (ref 1.005–1.030)

## 2017-02-24 MED ORDER — OXYCODONE-ACETAMINOPHEN 5-325 MG PO TABS: 1.0000 | ORAL_TABLET | ORAL | 0 refills | Status: DC | PRN

## 2017-02-24 MED ORDER — HYDROCODONE-ACETAMINOPHEN 5-325 MG PO TABS
1.0000 | ORAL_TABLET | Freq: Once | ORAL | Status: AC
  Administered 2017-02-24: 1 via ORAL
  Filled 2017-02-24: qty 1

## 2017-02-24 MED ORDER — HYDROMORPHONE HCL 1 MG/ML IJ SOLN
0.5000 mg | Freq: Once | INTRAMUSCULAR | Status: AC
  Administered 2017-02-24: 0.5 mg via INTRAMUSCULAR
  Filled 2017-02-24: qty 1

## 2017-02-24 MED ORDER — KETOROLAC TROMETHAMINE 30 MG/ML IJ SOLN
30.0000 mg | Freq: Once | INTRAMUSCULAR | Status: AC
  Administered 2017-02-24: 30 mg via INTRAMUSCULAR
  Filled 2017-02-24: qty 1

## 2017-02-24 MED ORDER — DOCUSATE SODIUM 100 MG PO CAPS: 100.0000 mg | ORAL_CAPSULE | Freq: Two times a day (BID) | ORAL | 0 refills | Status: DC

## 2017-02-24 MED ORDER — METAXALONE 800 MG PO TABS: 800.0000 mg | ORAL_TABLET | Freq: Three times a day (TID) | ORAL | 0 refills | Status: DC | PRN

## 2017-02-24 NOTE — ED Notes (Signed)
Pt and family stated understanding of d/c instructions, prescriptions, and follow up care. No further questions or complaints at this time.

## 2017-02-24 NOTE — ED Notes (Signed)
Pt ambulated short distance with walker.  Pt states she is ready to go and feels much better.

## 2017-02-24 NOTE — Discharge Instructions (Signed)
Take the medicines as directed.  Do not drive within 4 hours of taking oxycodone as this will make you drowsy.  Avoid lifting,  Bending,  Twisting or any other activity that worsens your pain over the next week.  Apply an  icepack  to your lower back for 10-15 minutes every 2 hours for the next 2 days, you may add heat (heating pad) for 20 minutes several times daily starting in 2 days.   You should get rechecked if your symptoms are not better over the next 5 days,  Or you develop increased pain,  Weakness in your leg(s) or loss of bladder or bowel function - these are symptoms of a worsening problem.

## 2017-02-24 NOTE — ED Triage Notes (Signed)
Pt c/o right lower back/hip pain x 2 days. Denies injury. Denies gi/gu sx.

## 2017-02-24 NOTE — ED Provider Notes (Signed)
[facility] DEPT Provider Note   CSN: [id number] Arrival date & time: 02/24/17  0901   By signing my name below, I, Hilbert Odor, attest that this documentation has been prepared under the direction and in the presence of Evalee Jefferson, PA-C. Electronically Signed: Hilbert Odor, Scribe. 02/24/17. 9:30 AM. History   Chief Complaint Chief Complaint  Patient presents with  . Back Pain    The history is provided by the patient. No language interpreter was used.  HPI Comments: [name] is a 83 y.o. female who presents to the Emergency Department complaining of right lower back pain for the past 2 days. She describes the pain as sharp. She states that the pain worsens with movements and deep breaths. She reports no radiation of her pain. She states that she isn't experiencing any pain on her left side. She has tried taking tylenol with no significant relief. She denies any recent injuries or extraneous activities. She denies abdominal pain, urinary symptoms, and new rashes. She has a hx of back problems and kidney stones but denies urinary complaints. She states that around 13 years ago she had a compression fracture. She denies ever having shingles.   Past Medical History:  Diagnosis Date  . Coronary atherosclerosis of native coronary artery    Multivessel status post CABG  . Depression   . Glaucoma   . Hydrocephalus   . Hyperlipidemia   . Nephrolithiasis   . Spinal stenosis   . Type 2 diabetes mellitus [facility])     Patient Active Problem List   Diagnosis Date Noted  . Acute respiratory failure with hypercapnia ([place]) 10/29/2016  . Acute diastolic heart failure ([place]) 10/29/2016  . Essential hypertension 10/29/2016  . Normocytic anemia 10/29/2016  . Diabetes mellitus with neuropathy ([place]) 10/29/2016  . COPD with exacerbation ([place]) 10/29/2016  . CAP (community acquired pneumonia) 10/26/2016  . Coronary atherosclerosis of native coronary artery 09/11/2013  . Mixed  hyperlipidemia 09/11/2013  . Carotid artery occlusion without infarction 09/11/2013  . Difficulty walking 06/24/2013    Past Surgical History:  Procedure Laterality Date  . APPENDECTOMY    . BACK SURGERY    . CORONARY ARTERY BYPASS GRAFT  11/24/1991   SVG to OM1, SVG to RCA  . FOOT SURGERY    . KYPHOPLASTY    . VENTRICULOPERITONEAL SHUNT      OB History    Gravida Para Term Preterm AB Living   3 3       3    SAB TAB Ectopic Multiple Live Births                   Home Medications    Prior to Admission medications   Medication Sig Start Date End Date Taking? Authorizing Provider  albuterol (PROVENTIL) (2.5 MG/3ML) 0.083% nebulizer solution Take 3 mLs (2.5 mg total) by nebulization every 6 (six) hours as needed for wheezing or shortness of breath. ICD 10 code J44.9 11/04/16   Kathie Dike, MD  aspirin EC 81 MG tablet Take 81 mg by mouth daily.    Historical Provider, MD  Choline Fenofibrate 135 MG capsule Take 135 mg by mouth daily.    Historical Provider, MD  docusate sodium (COLACE) 100 MG capsule Take 1 capsule (100 mg total) by mouth every 12 (twelve) hours. 02/24/17   Evalee Jefferson, PA-C  dorzolamide-timolol (COSOPT) 22.3-6.8 MG/ML ophthalmic solution Place 1 drop into both eyes 2 (two) times daily.  10/30/14   Historical Provider, MD  gabapentin (NEURONTIN) 100 MG  capsule Take 100 mg by mouth 3 (three) times daily.  09/29/14   Historical Provider, MD  latanoprost (XALATAN) 0.005 % ophthalmic solution Place 1 drop into both eyes at bedtime.    Historical Provider, MD  magnesium hydroxide (MILK OF MAGNESIA) 400 MG/5ML suspension Take 30 mLs by mouth every other day.    Historical Provider, MD  Melatonin 5 MG TABS Take 5 mg by mouth at bedtime.    Historical Provider, MD  metaxalone (SKELAXIN) 800 MG tablet Take 1 tablet (800 mg total) by mouth 3 (three) times daily as needed for muscle spasms. 02/24/17   Evalee Jefferson, PA-C  Milnacipran (SAVELLA) 50 MG TABS Take 50 mg by mouth 2 (two)  times daily.    Historical Provider, MD  mometasone-formoterol (DULERA) 100-5 MCG/ACT AERO Inhale 2 puffs into the lungs 2 (two) times daily. 11/03/16   Kathie Dike, MD  Multiple Vitamins-Minerals (MULTIVITAMIN WITH MINERALS) tablet Take 1 tablet by mouth every evening.     Historical Provider, MD  nitroGLYCERIN (NITROSTAT) 0.4 MG SL tablet Place 1 tablet (0.4 mg total) under the tongue every 5 (five) minutes as needed for chest pain. 02/03/16   Satira Sark, MD  oxyCODONE-acetaminophen (PERCOCET/ROXICET) 5-325 MG tablet Take 1 tablet by mouth every 4 (four) hours as needed for severe pain. 02/24/17   Evalee Jefferson, PA-C  pantoprazole (PROTONIX) 40 MG tablet Take 40 mg by mouth daily.  08/31/13   Historical Provider, MD  pioglitazone (ACTOS) 15 MG tablet Take 15 mg by mouth daily.  10/03/14   Historical Provider, MD  predniSONE (DELTASONE) 10 MG tablet Take 30mg  daily for 1 day then 20mg  daily for 1 day then 10mg  daily for 1 day then stop 11/03/16   Kathie Dike, MD  rosuvastatin (CRESTOR) 40 MG tablet Take 40 mg by mouth every evening.  08/20/16   Historical Provider, MD  sitaGLIPtan-metformin (JANUMET) 50-1000 MG per tablet Take 1 tablet by mouth 2 (two) times daily with a meal.    Historical Provider, MD    Family History Family History  Problem Relation Age of Onset  . Cancer Father   . Hyperlipidemia Daughter   . Diabetes Brother   . Heart disease Brother     Social History Social History  Substance Use Topics  . Smoking status: Former Smoker    Packs/day: 2.00    Types: Cigarettes    Start date: 01/15/1958    Quit date: 12/17/1993  . Smokeless tobacco: Never Used  . Alcohol use No     Allergies   Codeine; Penicillins; and Sulfa antibiotics   Review of Systems Review of Systems  Constitutional: Negative for fever.  Respiratory: Negative for cough.   Gastrointestinal: Negative for abdominal pain, nausea and vomiting.  Genitourinary: Negative for difficulty urinating and  dysuria.  Musculoskeletal: Positive for back pain (right lower).  Skin: Negative for rash.     Physical Exam Updated Vital Signs BP 132/68   Pulse 92   Temp 98.6 F (37 C) (Oral)   Resp 16   Ht 5\' 3"  (1.6 m)   Wt 77.6 kg   SpO2 93%   BMI 30.29 kg/m   Physical Exam  Constitutional: She is oriented to person, place, and time. She appears well-developed and well-nourished.  HENT:  Head: Normocephalic.  Eyes: EOM are normal.  Neck: Normal range of motion.  Cardiovascular: Intact distal pulses.   Pedal pulses intact in her bilateral feet.  Pulmonary/Chest: Effort normal.  Abdominal: She exhibits no distension.  Musculoskeletal: Normal range of motion. She exhibits tenderness.  Mildly tender along her lumbar midline but she is point tender at her right lateral superior pelvic rim. No edema, obvious deformity, or muscle spasms. She has full strength in ankle, flexion, and extension. She can range her right knee and hip without discomfort.  Neurological: She is alert and oriented to person, place, and time.  Skin: No rash noted.  Psychiatric: She has a normal mood and affect.  Nursing note and vitals reviewed.  ED Treatments / Results  DIAGNOSTIC STUDIES: Oxygen Saturation is 91% on [place], low by my interpretation.    COORDINATION OF CARE: 9:20 AM Discussed treatment plan with pt at bedside and pt agreed to plan. I will check the patient's X-ray of her lumbar spine. I will also check her lab work.  Labs (all labs ordered are listed, but only abnormal results are displayed) Labs Reviewed  URINALYSIS, ROUTINE W REFLEX MICROSCOPIC - Abnormal; Notable for the following:       Result Value   APPearance CLOUDY (*)    All other components within normal limits    EKG  EKG Interpretation None       Radiology Dg Lumbar Spine Complete  Result Date: 02/24/2017 CLINICAL DATA:  Back pain for 2 days, no known injury, initial encounter EXAM: LUMBAR SPINE - COMPLETE 4+ VIEW  COMPARISON:  01/11/2012 FINDINGS: Five lumbar type vertebral bodies are well visualized. Changes of prior fusion are again seen at L4-5 with stable chronic anterolisthesis of L4 on L5. Vertebral augmentation is noted at L1. No acute compression deformity is seen. Diffuse aortic calcifications are noted. A ventricular shunt is seen. IMPRESSION: Chronic changes without acute abnormality. Electronically Signed   By: Inez Catalina M.D.   On: 02/24/2017 10:12   Dg Hip Unilat W Or W/o Pelvis 2-3 Views Right  Result Date: 02/24/2017 CLINICAL DATA:  Right hip pain for 2 days, no known injury, initial encounter EXAM: DG HIP (WITH OR WITHOUT PELVIS) 2-3V RIGHT COMPARISON:  None. FINDINGS: Pelvic ring is intact. No acute fracture or dislocation is noted. No soft tissue abnormality is seen. IMPRESSION: No acute abnormality noted. Electronically Signed   By: Inez Catalina M.D.   On: 02/24/2017 10:13    Procedures Procedures (including critical care time)  Medications Ordered in ED Medications  HYDROcodone-acetaminophen (NORCO/VICODIN) 5-325 MG per tablet 1 tablet (1 tablet Oral Given 02/24/17 0944)  ketorolac (TORADOL) 30 MG/ML injection 30 mg (30 mg Intramuscular Given 02/24/17 0944)  HYDROmorphone (DILAUDID) injection 0.5 mg (0.5 mg Intramuscular Given 02/24/17 1133)     Initial Impression / Assessment and Plan / ED Course  I have reviewed the triage vital signs and the nursing notes.  Pertinent labs & imaging results that were available during my care of the patient were reviewed by me and considered in my medical decision making (see chart for details).     Pt with normal imaging, reproducible right lower back pain, suggesting musculoskeletal source.  She was tx with IM toradol and hydrocodone with no significant improvement in pain.  Added dilaudid 0.5 mg IM with improved sx.  She was able to ambulate in dept. Prior to dc home.  No neuro deficit on exam or by history to suggest emergent or surgical  presentation.  discussed worsened sx that should prompt immediate re-evaluation including distal weakness, bowel/bladder retention/incontinence.  Dg at bedside states she has appt with pcp in am.  Meds prescribed, discussed ice/heat tx. Caution re sedation.  Final Clinical Impressions(s) / ED Diagnoses   Final diagnoses:  Acute right-sided low back pain without sciatica    New Prescriptions New Prescriptions   DOCUSATE SODIUM (COLACE) 100 MG CAPSULE    Take 1 capsule (100 mg total) by mouth every 12 (twelve) hours.   METAXALONE (SKELAXIN) 800 MG TABLET    Take 1 tablet (800 mg total) by mouth 3 (three) times daily as needed for muscle spasms.   OXYCODONE-ACETAMINOPHEN (PERCOCET/ROXICET) 5-325 MG TABLET    Take 1 tablet by mouth every 4 (four) hours as needed for severe pain.   I personally performed the services described in this documentation, which was scribed in my presence. The recorded information has been reviewed and is accurate.    Evalee Jefferson, PA-C 02/24/17 1252    Isla Pence, MD 02/24/17 [phone]

## 2017-02-26 DIAGNOSIS — M545 Low back pain: Secondary | ICD-10-CM | POA: Diagnosis not present

## 2017-02-26 DIAGNOSIS — H40009 Preglaucoma, unspecified, unspecified eye: Secondary | ICD-10-CM | POA: Diagnosis not present

## 2017-02-26 DIAGNOSIS — R0902 Hypoxemia: Secondary | ICD-10-CM | POA: Diagnosis not present

## 2017-02-26 DIAGNOSIS — F5101 Primary insomnia: Secondary | ICD-10-CM | POA: Diagnosis not present

## 2017-02-26 DIAGNOSIS — J449 Chronic obstructive pulmonary disease, unspecified: Secondary | ICD-10-CM | POA: Diagnosis not present

## 2017-02-26 DIAGNOSIS — J9602 Acute respiratory failure with hypercapnia: Secondary | ICD-10-CM | POA: Diagnosis not present

## 2017-02-26 DIAGNOSIS — I10 Essential (primary) hypertension: Secondary | ICD-10-CM | POA: Diagnosis not present

## 2017-02-26 DIAGNOSIS — D509 Iron deficiency anemia, unspecified: Secondary | ICD-10-CM | POA: Diagnosis not present

## 2017-03-02 ENCOUNTER — Encounter (HOSPITAL_COMMUNITY): Payer: Self-pay | Admitting: Emergency Medicine

## 2017-03-02 ENCOUNTER — Emergency Department (HOSPITAL_COMMUNITY)
Admission: EM | Admit: 2017-03-02 | Discharge: 2017-03-02 | Disposition: A | Discharge status: Home / Self Care | Payer: Medicare Other | Attending: Emergency Medicine | Admitting: Emergency Medicine

## 2017-03-02 DIAGNOSIS — M545 Low back pain, unspecified: Secondary | ICD-10-CM

## 2017-03-02 DIAGNOSIS — I2510 Atherosclerotic heart disease of native coronary artery without angina pectoris: Secondary | ICD-10-CM | POA: Diagnosis not present

## 2017-03-02 DIAGNOSIS — Z79899 Other long term (current) drug therapy: Secondary | ICD-10-CM | POA: Insufficient documentation

## 2017-03-02 DIAGNOSIS — I110 Hypertensive heart disease with heart failure: Secondary | ICD-10-CM | POA: Diagnosis not present

## 2017-03-02 DIAGNOSIS — Z7982 Long term (current) use of aspirin: Secondary | ICD-10-CM | POA: Insufficient documentation

## 2017-03-02 DIAGNOSIS — I5031 Acute diastolic (congestive) heart failure: Secondary | ICD-10-CM | POA: Insufficient documentation

## 2017-03-02 DIAGNOSIS — J449 Chronic obstructive pulmonary disease, unspecified: Secondary | ICD-10-CM | POA: Insufficient documentation

## 2017-03-02 DIAGNOSIS — Z7984 Long term (current) use of oral hypoglycemic drugs: Secondary | ICD-10-CM | POA: Diagnosis not present

## 2017-03-02 DIAGNOSIS — Z87891 Personal history of nicotine dependence: Secondary | ICD-10-CM | POA: Diagnosis not present

## 2017-03-02 DIAGNOSIS — E119 Type 2 diabetes mellitus without complications: Secondary | ICD-10-CM | POA: Diagnosis not present

## 2017-03-02 DIAGNOSIS — Z951 Presence of aortocoronary bypass graft: Secondary | ICD-10-CM | POA: Diagnosis not present

## 2017-03-02 MED ORDER — OXYCODONE-ACETAMINOPHEN 5-325 MG PO TABS: 1.0000 | ORAL_TABLET | ORAL | 0 refills | Status: DC | PRN

## 2017-03-02 MED ORDER — MORPHINE SULFATE (PF) 4 MG/ML IV SOLN
4.0000 mg | Freq: Once | INTRAVENOUS | Status: AC
  Administered 2017-03-02: 4 mg via INTRAVENOUS
  Filled 2017-03-02: qty 1

## 2017-03-02 MED ORDER — METAXALONE 800 MG PO TABS: 800.0000 mg | ORAL_TABLET | Freq: Three times a day (TID) | ORAL | 0 refills | Status: DC

## 2017-03-02 NOTE — Discharge Instructions (Signed)
Take the medication as prescribed, call Dr. Nevada Crane to schedule an office appointment for next week. If you continue to have low back pain you may need to have MRI of your back which can be scheduled by Dr. Nevada Crane

## 2017-03-02 NOTE — ED Triage Notes (Signed)
Pt reports ongoing LT sided lower back pain that worsens with movement. Pt was seen in ED for same complaint. Denies any improvement. Pt wears 2L at home.

## 2017-03-02 NOTE — ED Notes (Signed)
Pt ambulates with Rolator without difficulty- gait is steady.

## 2017-03-02 NOTE — ED Provider Notes (Signed)
[place] DEPT Provider Note   CSN: [id number] Arrival date & time: 03/02/17  1721     History   Chief Complaint Chief Complaint  Patient presents with  . Back Pain    HPI [name] is a 83 y.o. female.  HPI    Complains of Low nonradiating back pain onset 12 days ago. Pain is worse with changing positions improved with remaining still. She was seen here on 02/24/2017 for same complaint She's been treated with Percocet and Flexeril with partial relief. She has since seen Dr. Nevada Crane who told her to cut Percocet dose in half to one half tablet every 4 hours as needed for pain. She denies loss of bladder or bowel control denies fever. No other associated symptoms. Denies abdominal pain. Past Medical History:  Diagnosis Date  . Coronary atherosclerosis of native coronary artery    Multivessel status post CABG  . Depression   . Glaucoma   . Hydrocephalus   . Hyperlipidemia   . Nephrolithiasis   . Spinal stenosis   . Type 2 diabetes mellitus [facility])     Patient Active Problem List   Diagnosis Date Noted  . Acute respiratory failure with hypercapnia ([place]) 10/29/2016  . Acute diastolic heart failure ([place]) 10/29/2016  . Essential hypertension 10/29/2016  . Normocytic anemia 10/29/2016  . Diabetes mellitus with neuropathy ([place]) 10/29/2016  . COPD with exacerbation ([place]) 10/29/2016  . CAP (community acquired pneumonia) 10/26/2016  . Coronary atherosclerosis of native coronary artery 09/11/2013  . Mixed hyperlipidemia 09/11/2013  . Carotid artery occlusion without infarction 09/11/2013  . Difficulty walking 06/24/2013    Past Surgical History:  Procedure Laterality Date  . APPENDECTOMY    . BACK SURGERY    . CORONARY ARTERY BYPASS GRAFT  11/24/1991   SVG to OM1, SVG to RCA  . FOOT SURGERY    . KYPHOPLASTY    . VENTRICULOPERITONEAL SHUNT      OB History    Gravida Para Term Preterm AB Living   3 3       3    SAB TAB Ectopic Multiple Live Births                   Home Medications    Prior to Admission medications   Medication Sig Start Date End Date Taking? Authorizing Provider  albuterol (PROVENTIL) (2.5 MG/3ML) 0.083% nebulizer solution Take 3 mLs (2.5 mg total) by nebulization every 6 (six) hours as needed for wheezing or shortness of breath. ICD 10 code J44.9 11/04/16   Kathie Dike, MD  aspirin EC 81 MG tablet Take 81 mg by mouth daily.    Historical Provider, MD  Choline Fenofibrate 135 MG capsule Take 135 mg by mouth daily.    Historical Provider, MD  docusate sodium (COLACE) 100 MG capsule Take 1 capsule (100 mg total) by mouth every 12 (twelve) hours. 02/24/17   Evalee Jefferson, PA-C  dorzolamide-timolol (COSOPT) 22.3-6.8 MG/ML ophthalmic solution Place 1 drop into both eyes 2 (two) times daily.  10/30/14   Historical Provider, MD  gabapentin (NEURONTIN) 100 MG capsule Take 100 mg by mouth 3 (three) times daily.  09/29/14   Historical Provider, MD  latanoprost (XALATAN) 0.005 % ophthalmic solution Place 1 drop into both eyes at bedtime.    Historical Provider, MD  magnesium hydroxide (MILK OF MAGNESIA) 400 MG/5ML suspension Take 30 mLs by mouth every other day.    Historical Provider, MD  Melatonin 5 MG TABS Take 5 mg by mouth at  bedtime.    Historical Provider, MD  metaxalone (SKELAXIN) 800 MG tablet Take 1 tablet (800 mg total) by mouth 3 (three) times daily as needed for muscle spasms. 02/24/17   Evalee Jefferson, PA-C  Milnacipran (SAVELLA) 50 MG TABS Take 50 mg by mouth 2 (two) times daily.    Historical Provider, MD  mometasone-formoterol (DULERA) 100-5 MCG/ACT AERO Inhale 2 puffs into the lungs 2 (two) times daily. 11/03/16   Kathie Dike, MD  Multiple Vitamins-Minerals (MULTIVITAMIN WITH MINERALS) tablet Take 1 tablet by mouth every evening.     Historical Provider, MD  nitroGLYCERIN (NITROSTAT) 0.4 MG SL tablet Place 1 tablet (0.4 mg total) under the tongue every 5 (five) minutes as needed for chest pain. 02/03/16   Satira Sark, MD   oxyCODONE-acetaminophen (PERCOCET/ROXICET) 5-325 MG tablet Take 1 tablet by mouth every 4 (four) hours as needed for severe pain. 02/24/17   Evalee Jefferson, PA-C  pantoprazole (PROTONIX) 40 MG tablet Take 40 mg by mouth daily.  08/31/13   Historical Provider, MD  pioglitazone (ACTOS) 15 MG tablet Take 15 mg by mouth daily.  10/03/14   Historical Provider, MD  predniSONE (DELTASONE) 10 MG tablet Take 30mg  daily for 1 day then 20mg  daily for 1 day then 10mg  daily for 1 day then stop 11/03/16   Kathie Dike, MD  rosuvastatin (CRESTOR) 40 MG tablet Take 40 mg by mouth every evening.  08/20/16   Historical Provider, MD  sitaGLIPtan-metformin (JANUMET) 50-1000 MG per tablet Take 1 tablet by mouth 2 (two) times daily with a meal.    Historical Provider, MD    Family History Family History  Problem Relation Age of Onset  . Cancer Father   . Hyperlipidemia Daughter   . Diabetes Brother   . Heart disease Brother     Social History Social History  Substance Use Topics  . Smoking status: Former Smoker    Packs/day: 2.00    Types: Cigarettes    Start date: 01/15/1958    Quit date: 12/17/1993  . Smokeless tobacco: Never Used  . Alcohol use No     Allergies   Codeine; Penicillins; and Sulfa antibiotics   Review of Systems Review of Systems  Musculoskeletal: Positive for back pain and gait problem.       Walks with walker  Allergic/Immunologic: Positive for immunocompromised state.       Diabetic  All other systems reviewed and are negative.    Physical Exam Updated Vital Signs BP 130/64   Pulse (!) 111   Temp 98.4 F (36.9 C) (Oral)   Resp 17   Ht 5\' 3"  (1.6 m)   Wt 171 lb (77.6 kg)   SpO2 92%   BMI 30.29 kg/m   Physical Exam  Constitutional:  Chronically ill-appearing  HENT:  Head: Normocephalic and atraumatic.  Eyes: Conjunctivae are normal. Pupils are equal, round, and reactive to light.  Neck: Neck supple. No tracheal deviation present. No thyromegaly present.   Cardiovascular: Normal rate and regular rhythm.   No murmur heard. Pulmonary/Chest: Effort normal and breath sounds normal.  Abdominal: Soft. Bowel sounds are normal. She exhibits no distension. There is no tenderness.  Obese  Musculoskeletal: Normal range of motion. She exhibits no edema or tenderness.  Binders nontender however she has exquisite pain at her lumbar area when she changes position bed. DTRs symmetric bilaterally at knee jerk ankle jerk. Toes downgoing bilaterally  Neurological: She is alert. Coordination normal.  Skin: Skin is warm and dry. No rash  noted.  Psychiatric: She has a normal mood and affect.  Nursing note and vitals reviewed.  9:25 PM Pain improved after IV morphine X-rays and lab work from 02/24/2017 reviewed  ED Treatments / Results  Labs (all labs ordered are listed, but only abnormal results are displayed) Labs Reviewed - No data to display  EKG  EKG Interpretation None       Radiology No results found.  Procedures Procedures (including critical care time)  Medications Ordered in ED Medications  morphine 4 MG/ML injection 4 mg (4 mg Intravenous Given 03/02/17 2051)     Initial Impression / Assessment and Plan / ED Course  I have reviewed the triage vital signs and the nursing notes.  Pertinent labs & imaging results that were available during my care of the patient were reviewed by me and considered in my medical decision making (see chart for details).     10 PM she feels much improved after treatment with intravenous morphine. She is able to walk with her walker without difficulty  Plan I'll write refills for her prescription since Percocet and Skelaxin as she states she has only a few tablets left. Back pain is felt to be musculoskeletal in etiology. She is encouraged to follow up with Dr. Nevada Crane if having significant pain by next week. Only red flags for back pain is age, diabetes. She is not currently on prednisone I don't feel that  emergent MRI is indicated as patient has long-standing history of back pain and has history of lumbar fusion   Final Clinical Impressions(s) / ED Diagnoses  Diagnosis low back pain  Final diagnoses:  None    New Prescriptions New Prescriptions   No medications on file     Orlie Dakin, MD 03/02/17 2207

## 2017-03-04 DIAGNOSIS — J449 Chronic obstructive pulmonary disease, unspecified: Secondary | ICD-10-CM | POA: Diagnosis not present

## 2017-03-07 DIAGNOSIS — M62830 Muscle spasm of back: Secondary | ICD-10-CM | POA: Diagnosis not present

## 2017-03-07 DIAGNOSIS — M545 Low back pain: Secondary | ICD-10-CM | POA: Diagnosis not present

## 2017-03-19 DIAGNOSIS — J449 Chronic obstructive pulmonary disease, unspecified: Secondary | ICD-10-CM | POA: Diagnosis not present

## 2017-03-19 DIAGNOSIS — I2510 Atherosclerotic heart disease of native coronary artery without angina pectoris: Secondary | ICD-10-CM | POA: Diagnosis not present

## 2017-03-19 DIAGNOSIS — G2581 Restless legs syndrome: Secondary | ICD-10-CM | POA: Diagnosis not present

## 2017-03-19 DIAGNOSIS — M545 Low back pain: Secondary | ICD-10-CM | POA: Diagnosis not present

## 2017-03-21 ENCOUNTER — Other Ambulatory Visit (HOSPITAL_COMMUNITY): Payer: Self-pay | Admitting: Pulmonary Disease

## 2017-03-21 DIAGNOSIS — M545 Low back pain: Secondary | ICD-10-CM

## 2017-03-26 ENCOUNTER — Ambulatory Visit (HOSPITAL_COMMUNITY)
Admission: RE | Admit: 2017-03-26 | Discharge: 2017-03-26 | Disposition: A | Discharge status: Home / Self Care | Payer: Medicare Other | Source: Ambulatory Visit | Attending: Pulmonary Disease | Admitting: Pulmonary Disease

## 2017-03-26 DIAGNOSIS — M4854XA Collapsed vertebra, not elsewhere classified, thoracic region, initial encounter for fracture: Secondary | ICD-10-CM | POA: Insufficient documentation

## 2017-03-26 DIAGNOSIS — M545 Low back pain: Secondary | ICD-10-CM | POA: Diagnosis not present

## 2017-03-26 DIAGNOSIS — S22080A Wedge compression fracture of T11-T12 vertebra, initial encounter for closed fracture: Secondary | ICD-10-CM | POA: Diagnosis not present

## 2017-03-26 DIAGNOSIS — R29890 Loss of height: Secondary | ICD-10-CM | POA: Insufficient documentation

## 2017-04-02 ENCOUNTER — Other Ambulatory Visit (HOSPITAL_COMMUNITY): Payer: Self-pay | Admitting: Interventional Radiology

## 2017-04-02 DIAGNOSIS — M4850XA Collapsed vertebra, not elsewhere classified, site unspecified, initial encounter for fracture: Principal | ICD-10-CM

## 2017-04-02 DIAGNOSIS — IMO0001 Reserved for inherently not codable concepts without codable children: Secondary | ICD-10-CM

## 2017-04-04 DIAGNOSIS — J449 Chronic obstructive pulmonary disease, unspecified: Secondary | ICD-10-CM | POA: Diagnosis not present

## 2017-04-05 DIAGNOSIS — E119 Type 2 diabetes mellitus without complications: Secondary | ICD-10-CM | POA: Diagnosis not present

## 2017-04-15 ENCOUNTER — Ambulatory Visit (HOSPITAL_COMMUNITY)
Admission: RE | Admit: 2017-04-15 | Discharge: 2017-04-15 | Disposition: A | Discharge status: Home / Self Care | Payer: Medicare Other | Source: Ambulatory Visit | Attending: Interventional Radiology | Admitting: Interventional Radiology

## 2017-04-15 ENCOUNTER — Encounter (HOSPITAL_COMMUNITY): Payer: Self-pay

## 2017-04-15 ENCOUNTER — Other Ambulatory Visit (HOSPITAL_COMMUNITY): Payer: Self-pay | Admitting: Interventional Radiology

## 2017-04-15 DIAGNOSIS — IMO0001 Reserved for inherently not codable concepts without codable children: Secondary | ICD-10-CM

## 2017-04-15 DIAGNOSIS — M4850XA Collapsed vertebra, not elsewhere classified, site unspecified, initial encounter for fracture: Principal | ICD-10-CM

## 2017-04-18 ENCOUNTER — Other Ambulatory Visit: Payer: Self-pay | Admitting: Radiology

## 2017-04-18 ENCOUNTER — Telehealth: Payer: Self-pay | Admitting: Internal Medicine

## 2017-04-18 ENCOUNTER — Other Ambulatory Visit: Payer: Self-pay | Admitting: Student

## 2017-04-18 NOTE — Telephone Encounter (Signed)
RECALL FOR TCS °

## 2017-04-18 NOTE — Telephone Encounter (Signed)
Letter mailed to pt.  

## 2017-04-19 ENCOUNTER — Ambulatory Visit (HOSPITAL_COMMUNITY)
Admission: RE | Admit: 2017-04-19 | Discharge: 2017-04-19 | Disposition: A | Discharge status: Home / Self Care | Payer: Medicare Other | Source: Ambulatory Visit | Attending: Interventional Radiology | Admitting: Interventional Radiology

## 2017-04-19 ENCOUNTER — Other Ambulatory Visit (HOSPITAL_COMMUNITY): Payer: Self-pay | Admitting: Interventional Radiology

## 2017-04-19 ENCOUNTER — Encounter (HOSPITAL_COMMUNITY): Payer: Self-pay

## 2017-04-19 DIAGNOSIS — Z880 Allergy status to penicillin: Secondary | ICD-10-CM | POA: Diagnosis not present

## 2017-04-19 DIAGNOSIS — M4800 Spinal stenosis, site unspecified: Secondary | ICD-10-CM | POA: Diagnosis not present

## 2017-04-19 DIAGNOSIS — E785 Hyperlipidemia, unspecified: Secondary | ICD-10-CM | POA: Insufficient documentation

## 2017-04-19 DIAGNOSIS — Z7951 Long term (current) use of inhaled steroids: Secondary | ICD-10-CM | POA: Diagnosis not present

## 2017-04-19 DIAGNOSIS — Z982 Presence of cerebrospinal fluid drainage device: Secondary | ICD-10-CM | POA: Insufficient documentation

## 2017-04-19 DIAGNOSIS — G919 Hydrocephalus, unspecified: Secondary | ICD-10-CM | POA: Diagnosis not present

## 2017-04-19 DIAGNOSIS — IMO0001 Reserved for inherently not codable concepts without codable children: Secondary | ICD-10-CM

## 2017-04-19 DIAGNOSIS — E119 Type 2 diabetes mellitus without complications: Secondary | ICD-10-CM | POA: Diagnosis not present

## 2017-04-19 DIAGNOSIS — F329 Major depressive disorder, single episode, unspecified: Secondary | ICD-10-CM | POA: Diagnosis not present

## 2017-04-19 DIAGNOSIS — Z7984 Long term (current) use of oral hypoglycemic drugs: Secondary | ICD-10-CM | POA: Insufficient documentation

## 2017-04-19 DIAGNOSIS — Z882 Allergy status to sulfonamides status: Secondary | ICD-10-CM | POA: Diagnosis not present

## 2017-04-19 DIAGNOSIS — M546 Pain in thoracic spine: Secondary | ICD-10-CM | POA: Diagnosis not present

## 2017-04-19 DIAGNOSIS — Z951 Presence of aortocoronary bypass graft: Secondary | ICD-10-CM | POA: Insufficient documentation

## 2017-04-19 DIAGNOSIS — M4854XA Collapsed vertebra, not elsewhere classified, thoracic region, initial encounter for fracture: Secondary | ICD-10-CM | POA: Diagnosis not present

## 2017-04-19 DIAGNOSIS — Z87891 Personal history of nicotine dependence: Secondary | ICD-10-CM | POA: Diagnosis not present

## 2017-04-19 DIAGNOSIS — Z7982 Long term (current) use of aspirin: Secondary | ICD-10-CM | POA: Insufficient documentation

## 2017-04-19 DIAGNOSIS — H409 Unspecified glaucoma: Secondary | ICD-10-CM | POA: Insufficient documentation

## 2017-04-19 DIAGNOSIS — M4850XA Collapsed vertebra, not elsewhere classified, site unspecified, initial encounter for fracture: Principal | ICD-10-CM

## 2017-04-19 DIAGNOSIS — I2510 Atherosclerotic heart disease of native coronary artery without angina pectoris: Secondary | ICD-10-CM | POA: Insufficient documentation

## 2017-04-19 HISTORY — PX: IR VERTEBROPLASTY CERV/THOR BX INC UNI/BIL INC/INJECT/IMAGING: IMG5515

## 2017-04-19 LAB — COMPREHENSIVE METABOLIC PANEL
ALT: 19 U/L (ref 14–54)
ANION GAP: 8 (ref 5–15)
AST: 23 U/L (ref 15–41)
Albumin: 3.6 g/dL (ref 3.5–5.0)
Alkaline Phosphatase: 84 U/L (ref 38–126)
BUN: 24 mg/dL — AB (ref 6–20)
CHLORIDE: 99 mmol/L — AB (ref 101–111)
CO2: 34 mmol/L — ABNORMAL HIGH (ref 22–32)
Calcium: 10 mg/dL (ref 8.9–10.3)
Creatinine, Ser: 0.77 mg/dL (ref 0.44–1.00)
GFR calc Af Amer: >60 mL/min (ref >60)
Glucose, Bld: 160 mg/dL — ABNORMAL HIGH (ref 65–99)
POTASSIUM: 3.8 mmol/L (ref 3.5–5.1)
Sodium: 141 mmol/L (ref 135–145)
Total Bilirubin: 0.4 mg/dL (ref 0.3–1.2)
Total Protein: 7.3 g/dL (ref 6.5–8.1)

## 2017-04-19 LAB — PROTIME-INR
INR: 1.01
PROTHROMBIN TIME: 13.3 s (ref 11.4–15.2)

## 2017-04-19 LAB — CBC
HCT: 39.5 % (ref 36.0–46.0)
Hemoglobin: 11.7 g/dL — ABNORMAL LOW (ref 12.0–15.0)
MCH: 28.9 pg (ref 26.0–34.0)
MCHC: 29.6 g/dL — AB (ref 30.0–36.0)
MCV: 97.5 fL (ref 78.0–100.0)
PLATELETS: 317 10*3/uL (ref 150–400)
RBC: 4.05 MIL/uL (ref 3.87–5.11)
RDW: 16.4 % — ABNORMAL HIGH (ref 11.5–15.5)
WBC: 6.3 10*3/uL (ref 4.0–10.5)

## 2017-04-19 LAB — APTT: APTT: 30 s (ref 24–36)

## 2017-04-19 MED ORDER — TOBRAMYCIN SULFATE 1.2 G IJ SOLR
INTRAMUSCULAR | Status: DC | PRN
  Administered 2017-04-19: .1 g via TOPICAL

## 2017-04-19 MED ORDER — SODIUM CHLORIDE 0.9 % IV SOLN: INTRAVENOUS | Status: DC

## 2017-04-19 MED ORDER — VANCOMYCIN HCL IN DEXTROSE 1-5 GM/200ML-% IV SOLN
1000.0000 mg | Freq: Once | INTRAVENOUS | Status: AC
  Administered 2017-04-19: 1000 mg via INTRAVENOUS

## 2017-04-19 MED ORDER — MIDAZOLAM HCL 2 MG/2ML IJ SOLN
INTRAMUSCULAR | Status: DC | PRN
  Administered 2017-04-19 (×2): 1 mg via INTRAVENOUS

## 2017-04-19 MED ORDER — TOBRAMYCIN SULFATE 1.2 G IJ SOLR
INTRAMUSCULAR | Status: AC
  Filled 2017-04-19: qty 1.2

## 2017-04-19 MED ORDER — BUPIVACAINE HCL (PF) 0.25 % IJ SOLN
INTRAMUSCULAR | Status: AC
  Filled 2017-04-19: qty 30

## 2017-04-19 MED ORDER — BUPIVACAINE HCL (PF) 0.25 % IJ SOLN
INTRAMUSCULAR | Status: DC | PRN
  Administered 2017-04-19: 20 mL

## 2017-04-19 MED ORDER — MIDAZOLAM HCL 2 MG/2ML IJ SOLN
INTRAMUSCULAR | Status: AC
  Filled 2017-04-19: qty 2

## 2017-04-19 MED ORDER — IOPAMIDOL (ISOVUE-300) INJECTION 61%
INTRAVENOUS | Status: AC
  Administered 2017-04-19: 6 mL
  Filled 2017-04-19: qty 50

## 2017-04-19 MED ORDER — FENTANYL CITRATE (PF) 100 MCG/2ML IJ SOLN
INTRAMUSCULAR | Status: AC
  Filled 2017-04-19: qty 2

## 2017-04-19 MED ORDER — VANCOMYCIN HCL IN DEXTROSE 1-5 GM/200ML-% IV SOLN
INTRAVENOUS | Status: AC
  Filled 2017-04-19: qty 200

## 2017-04-19 MED ORDER — HYDROMORPHONE HCL 1 MG/ML IJ SOLN
INTRAMUSCULAR | Status: AC
  Filled 2017-04-19: qty 1

## 2017-04-19 MED ORDER — SODIUM CHLORIDE 0.9 % IV SOLN: INTRAVENOUS | Status: AC

## 2017-04-19 MED ORDER — FENTANYL CITRATE (PF) 100 MCG/2ML IJ SOLN
INTRAMUSCULAR | Status: DC | PRN
  Administered 2017-04-19 (×2): 25 ug via INTRAVENOUS

## 2017-04-19 NOTE — Sedation Documentation (Signed)
Pt is resting. Procedure started.

## 2017-04-19 NOTE — Sedation Documentation (Signed)
Patient is resting comfortably. 

## 2017-04-19 NOTE — Sedation Documentation (Signed)
Patient is resting comfortably. No complaints from pt at this time.  

## 2017-04-19 NOTE — H&P (Signed)
Chief Complaint: Patient was seen in consultation today for Thoracic 10 vertebroplasty/kyphoplasty at the request of Dr Velvet Bathe  Referring Physician(s): Dr Susann Givens  Supervising Physician: Luanne Bras  Patient Status: [facility] - Out-pt  History of Present Illness: [name] is a 83 y.o. female   Seen in ED 02/14/17 Sudden right hip and back pain 2 days prior Denies injury or fall Pain worsens with movement and deep breath Discharged with pain meds after Hip xrays were neg Returned 4/10 CT: IMPRESSION: New T12 compression fracture, likely due to osteoporosis/insufficiency, loss of 50% vertebral body height anteriorly. Slight retropulsion of posterosuperior endplate into the canal, 3-4 mm. Correlate clinically for vertebral augmentation.  Now scheduled for vertebroplasty/kyphoplasty  Past Medical History:  Diagnosis Date  . Coronary atherosclerosis of native coronary artery    Multivessel status post CABG  . Depression   . Glaucoma   . Hydrocephalus   . Hyperlipidemia   . Nephrolithiasis   . Spinal stenosis   . Type 2 diabetes mellitus ([place])     Past Surgical History:  Procedure Laterality Date  . APPENDECTOMY    . BACK SURGERY    . CORONARY ARTERY BYPASS GRAFT  11/24/1991   SVG to OM1, SVG to RCA  . FOOT SURGERY    . KYPHOPLASTY    . VENTRICULOPERITONEAL SHUNT      Allergies: Codeine; Penicillins; and Sulfa antibiotics  Medications: Prior to Admission medications   Medication Sig Start Date End Date Taking? Authorizing Provider  albuterol (PROVENTIL) (2.5 MG/3ML) 0.083% nebulizer solution Take 3 mLs (2.5 mg total) by nebulization every 6 (six) hours as needed for wheezing or shortness of breath. ICD 10 code J44.9 11/04/16  Yes Kathie Dike, MD  aspirin EC 81 MG tablet Take 81 mg by mouth daily.   Yes Historical Provider, MD  budesonide-formoterol (SYMBICORT) 160-4.5 MCG/ACT inhaler Inhale 2 puffs into the lungs 2 (two) times daily.   Yes  Historical Provider, MD  Choline Fenofibrate 135 MG capsule Take 135 mg by mouth daily.   Yes Historical Provider, MD  compounded topicals builder Apply 1 application topically 3 (three) times daily. (Lidocaine, Menthol, Diclofenac, Baclofen)   Yes Historical Provider, MD  dorzolamide-timolol (COSOPT) 22.3-6.8 MG/ML ophthalmic solution Place 1 drop into both eyes 2 (two) times daily.  10/30/14  Yes Historical Provider, MD  gabapentin (NEURONTIN) 100 MG capsule Take 100 mg by mouth 3 (three) times daily.  09/29/14  Yes Historical Provider, MD  latanoprost (XALATAN) 0.005 % ophthalmic solution Place 1 drop into both eyes at bedtime.   Yes Historical Provider, MD  lubiprostone (AMITIZA) 24 MCG capsule Take 24 mcg by mouth 2 (two) times daily with a meal.   Yes Historical Provider, MD  Milnacipran (SAVELLA) 50 MG TABS Take 50 mg by mouth 2 (two) times daily.   Yes Historical Provider, MD  Multiple Vitamins-Minerals (MULTIVITAMIN WITH MINERALS) tablet Take 1 tablet by mouth daily.    Yes Historical Provider, MD  nitroGLYCERIN (NITROSTAT) 0.4 MG SL tablet Place 1 tablet (0.4 mg total) under the tongue every 5 (five) minutes as needed for chest pain. 02/03/16  Yes Satira Sark, MD  Omega 3 1200 MG CAPS Take 1 capsule by mouth 3 (three) times daily.   Yes Historical Provider, MD  pantoprazole (PROTONIX) 40 MG tablet Take 40 mg by mouth daily.  08/31/13  Yes Historical Provider, MD  pioglitazone (ACTOS) 15 MG tablet Take 15 mg by mouth daily.  10/03/14  Yes Historical  Provider, MD  polyethylene glycol (MIRALAX / GLYCOLAX) packet Take 17 g by mouth at bedtime.   Yes Historical Provider, MD  rOPINIRole (REQUIP) 1 MG tablet Take 1 mg by mouth at bedtime.   Yes Historical Provider, MD  rosuvastatin (CRESTOR) 40 MG tablet Take 40 mg by mouth every evening.  08/20/16  Yes Historical Provider, MD  sitaGLIPtan-metformin (JANUMET) 50-1000 MG per tablet Take 1 tablet by mouth 2 (two) times daily with a meal.   Yes  Historical Provider, MD  traMADol (ULTRAM) 50 MG tablet Take 50 mg by mouth every 6 (six) hours as needed for moderate pain.   Yes Historical Provider, MD     Family History  Problem Relation Age of Onset  . Cancer Father   . Hyperlipidemia Daughter   . Diabetes Brother   . Heart disease Brother     Social History   Social History  . Marital status: Widowed    Spouse name: N/A  . Number of children: N/A  . Years of education: N/A   Social History Main Topics  . Smoking status: Former Smoker    Packs/day: 2.00    Types: Cigarettes    Start date: 01/15/1958    Quit date: 12/17/1993  . Smokeless tobacco: Never Used  . Alcohol use No  . Drug use: No  . Sexual activity: No   Other Topics Concern  . None   Social History Narrative  . None    Review of Systems: A 12 point ROS discussed and pertinent positives are indicated in the HPI above.  All other systems are negative.  Review of Systems  Constitutional: Positive for activity change and fatigue. Negative for appetite change and fever.  Musculoskeletal: Positive for back pain and gait problem.  Neurological: Positive for weakness.  Psychiatric/Behavioral: Negative for behavioral problems and confusion.    Vital Signs: BP (!) 146/82   Pulse (!) 104   Temp 97.9 F (36.6 C) (Oral)   Resp 20   Ht 5' 3.5" (1.613 m)   Wt 162 lb (73.5 kg)   SpO2 91%   BMI 28.25 kg/m   Physical Exam  Constitutional: She is oriented to person, place, and time.  Cardiovascular: Normal rate, regular rhythm and normal heart sounds.   Pulmonary/Chest: Effort normal and breath sounds normal.  Abdominal: Soft. Bowel sounds are normal.  Musculoskeletal: Normal range of motion.  Low back pain  Neurological: She is alert and oriented to person, place, and time.  Skin: Skin is warm and dry.  Psychiatric: She has a normal mood and affect. Her behavior is normal. Judgment and thought content normal.  Nursing note and vitals  reviewed.   Mallampati Score:  MD Evaluation Airway: WNL Heart: WNL Abdomen: WNL ASA  Classification: 3 Mallampati/Airway Score: One  Imaging: Ct Lumbar Spine Wo Contrast  Result Date: 03/27/2017 CLINICAL DATA:  Low back pain for 1 month.  No injury. EXAM: CT LUMBAR SPINE WITHOUT CONTRAST TECHNIQUE: Multidetector CT imaging of the lumbar spine was performed without intravenous contrast administration. Multiplanar CT image reconstructions were also generated. COMPARISON:  Plain films 02/24/2017.  MRI 01/11/2012. FINDINGS: Segmentation: Standard. Alignment: The patient has been fused in a position of anterolisthesis at L4-5, 3 mm. Otherwise anatomic alignment. Vertebrae: Solid arthrodesis L4-5. Prior vertebral augmentation L1. There is a new compression fracture at T12 not present on prior plain films of 02/24/2017. Pedicles intact. Loss of approximately 50% vertebral body height anteriorly. Slight retropulsion of the posterosuperior endplate into the canal, estimated  3-4 mm. Paraspinal and other soft tissues: No hydronephrosis. No paravertebral hematoma. Aortic atherosclerosis without aneurysm. Disc levels: L1-L2:  Normal. L2-L3:  Normal. L3-L4: Annular bulge. Posterior element hypertrophy. Mild stenosis could affect either L4 nerve root. L4-L5:  Adequate posterior decompression.  No impingement. L5-S1: Central protrusion. Vacuum phenomenon. Facet arthropathy. No definite impingement. IMPRESSION: New T12 compression fracture, likely due to osteoporosis/insufficiency, loss of 50% vertebral body height anteriorly. Slight retropulsion of posterosuperior endplate into the canal, 3-4 mm. Correlate clinically for vertebral augmentation. Mild adjacent segment disease L3-L4. Solid arthrodesis L4-5. Unremarkable vertebral augmentation L1. Electronically Signed   By: Staci Righter M.D.   On: 03/27/2017 08:21    Labs:  CBC:  Recent Labs  10/27/16 0706 10/28/16 0516 11/01/16 0635 04/19/17 0700  WBC  9.2 10.0 9.0 6.3  HGB 9.6* 9.1* 10.2* 11.7*  HCT 36.2 34.2* 37.1 39.5  PLT 408* 332 440* 317    COAGS: No results for input(s): INR, APTT in the last 8760 hours.  BMP:  Recent Labs  10/30/16 0444 10/31/16 0445 11/01/16 0635 11/02/16 0637  NA 135 135 134* 134*  K 4.3 4.7 4.5 4.5  CL 89* 94* 89* 93*  CO2 37* 32 34* 31  GLUCOSE 189* 213* 217* 204*  BUN 35* 30* 33* 33*  CALCIUM 9.3 9.6 10.2 10.1  CREATININE 0.65 0.61 0.68 0.73  GFRNONAA >60 >60 >60 >60  GFRAA >60 >60 >60 >60    LIVER FUNCTION TESTS: No results for input(s): BILITOT, AST, ALT, ALKPHOS, PROT, ALBUMIN in the last 8760 hours.  TUMOR MARKERS: No results for input(s): AFPTM, CEA, CA199, CHROMGRNA in the last 8760 hours.  Assessment and Plan:  Severe back pain Worsening x 1-2 months CT reveals acute T 12 fracture Now scheduled for Thoracic 12 VP/KP Risks and Benefits discussed with the patient including, but not limited to education regarding the natural healing process of compression fractures without intervention, bleeding, infection, cement migration which may cause spinal cord damage, paralysis, pulmonary embolism or even death. All of the patient's questions were answered, patient is agreeable to proceed. Consent signed and in chart.  Thank you for this interesting consult.  I greatly enjoyed meeting [name] and look forward to participating in their care.  A copy of this report was sent to the requesting provider on this date.  Electronically Signed: Kiele Heavrin A 04/19/2017, 8:00 AM   I spent a total of  30 Minutes   in face to face in clinical consultation, greater than 50% of which was counseling/coordinating care for T12 VP/KP

## 2017-04-19 NOTE — Discharge Instructions (Signed)
1. NO STOOPING,BENDING  OR LIFTING MORE THAN 10 LBS FOR 2 WEEKS. 2.Use walker to ambulate for 2 weeks. 3.No driving for 2 weeks. 4.. RTC PRN in 2 weeks  KYPHOPLASTY/VERTEBROPLASTY DISCHARGE INSTRUCTIONS  Medications: (check all that apply)     Resume all home medications as before procedure.       Resume your (aspirin/Plavix/Coumadin) on MD.                  Continue your pain medications as prescribed as needed.  Over the next 3-5 days, decrease your pain medication as tolerated.  Over the counter medications (i.e. Tylenol, ibuprofen, and aleve) may be substituted once severe/moderate pain symptoms have subsided.   Wound Care: - Bandages may be removed the day following your procedure.  You may get your incision wet once bandages are removed.  Bandaids may be used to cover the incisions until scab formation.  Topical ointments are optional.  - If you develop a fever greater than 101 degrees, have increased skin redness at the incision sites or pus-like oozing from incisions occurring within 1 week of the procedure, contact radiology at [phone] or [phone].  - Ice pack to back for 15-20 minutes 2-3 time per day for first 2-3 days post procedure.  The ice will expedite muscle healing and help with the pain from the incisions.   Activity: - Bedrest today with limited activity for 24 hours post procedure.  - No driving for 48 hours.  - Increase your activity as tolerated after bedrest (with assistance if necessary).  - Refrain from any strenuous activity or heavy lifting (greater than 10 lbs.).   Follow up: - Contact radiology at [phone] or [phone] if any questions/concerns.  - A physician assistant from radiology will contact you in approximately 1 week.  - If a biopsy was performed at the time of your procedure, your referring physician should receive the results in usually 2-3 days.

## 2017-04-19 NOTE — Procedures (Signed)
S/P T 12 vp

## 2017-04-20 DIAGNOSIS — M545 Low back pain: Secondary | ICD-10-CM | POA: Diagnosis not present

## 2017-04-22 ENCOUNTER — Telehealth (HOSPITAL_COMMUNITY): Payer: Self-pay

## 2017-04-22 ENCOUNTER — Encounter (HOSPITAL_COMMUNITY): Payer: Self-pay | Admitting: Interventional Radiology

## 2017-04-22 NOTE — Telephone Encounter (Signed)
Called to schedule 2 wk f/u. Pt stated that she just feels sore right now. She will call to schedule f/u if she needs it but right now she is fine. AW

## 2017-05-04 DIAGNOSIS — J449 Chronic obstructive pulmonary disease, unspecified: Secondary | ICD-10-CM | POA: Diagnosis not present

## 2017-05-18 DIAGNOSIS — L03811 Cellulitis of head [any part, except face]: Secondary | ICD-10-CM | POA: Diagnosis not present

## 2017-05-21 DIAGNOSIS — M545 Low back pain: Secondary | ICD-10-CM | POA: Diagnosis not present

## 2017-05-27 DIAGNOSIS — I10 Essential (primary) hypertension: Secondary | ICD-10-CM | POA: Diagnosis not present

## 2017-05-27 DIAGNOSIS — D509 Iron deficiency anemia, unspecified: Secondary | ICD-10-CM | POA: Diagnosis not present

## 2017-05-27 DIAGNOSIS — E119 Type 2 diabetes mellitus without complications: Secondary | ICD-10-CM | POA: Diagnosis not present

## 2017-05-29 DIAGNOSIS — D509 Iron deficiency anemia, unspecified: Secondary | ICD-10-CM | POA: Diagnosis not present

## 2017-05-29 DIAGNOSIS — E782 Mixed hyperlipidemia: Secondary | ICD-10-CM | POA: Diagnosis not present

## 2017-05-29 DIAGNOSIS — I10 Essential (primary) hypertension: Secondary | ICD-10-CM | POA: Diagnosis not present

## 2017-05-29 DIAGNOSIS — E119 Type 2 diabetes mellitus without complications: Secondary | ICD-10-CM | POA: Diagnosis not present

## 2017-05-29 DIAGNOSIS — J449 Chronic obstructive pulmonary disease, unspecified: Secondary | ICD-10-CM | POA: Diagnosis not present

## 2017-06-04 DIAGNOSIS — J449 Chronic obstructive pulmonary disease, unspecified: Secondary | ICD-10-CM | POA: Diagnosis not present

## 2017-06-20 DIAGNOSIS — M545 Low back pain: Secondary | ICD-10-CM | POA: Diagnosis not present

## 2017-07-04 DIAGNOSIS — J449 Chronic obstructive pulmonary disease, unspecified: Secondary | ICD-10-CM | POA: Diagnosis not present

## 2017-07-11 DIAGNOSIS — E119 Type 2 diabetes mellitus without complications: Secondary | ICD-10-CM | POA: Diagnosis not present

## 2017-07-29 DIAGNOSIS — H40053 Ocular hypertension, bilateral: Secondary | ICD-10-CM | POA: Diagnosis not present

## 2017-07-29 DIAGNOSIS — H43393 Other vitreous opacities, bilateral: Secondary | ICD-10-CM | POA: Diagnosis not present

## 2017-07-29 DIAGNOSIS — H401132 Primary open-angle glaucoma, bilateral, moderate stage: Secondary | ICD-10-CM | POA: Diagnosis not present

## 2017-08-01 DIAGNOSIS — J9611 Chronic respiratory failure with hypoxia: Secondary | ICD-10-CM | POA: Diagnosis not present

## 2017-08-01 DIAGNOSIS — I10 Essential (primary) hypertension: Secondary | ICD-10-CM | POA: Diagnosis not present

## 2017-08-01 DIAGNOSIS — J449 Chronic obstructive pulmonary disease, unspecified: Secondary | ICD-10-CM | POA: Diagnosis not present

## 2017-08-01 DIAGNOSIS — I2510 Atherosclerotic heart disease of native coronary artery without angina pectoris: Secondary | ICD-10-CM | POA: Diagnosis not present

## 2017-08-04 DIAGNOSIS — J449 Chronic obstructive pulmonary disease, unspecified: Secondary | ICD-10-CM | POA: Diagnosis not present

## 2017-09-02 DIAGNOSIS — I10 Essential (primary) hypertension: Secondary | ICD-10-CM | POA: Diagnosis not present

## 2017-09-02 DIAGNOSIS — I259 Chronic ischemic heart disease, unspecified: Secondary | ICD-10-CM | POA: Diagnosis not present

## 2017-09-02 DIAGNOSIS — E119 Type 2 diabetes mellitus without complications: Secondary | ICD-10-CM | POA: Diagnosis not present

## 2017-09-03 DIAGNOSIS — J9602 Acute respiratory failure with hypercapnia: Secondary | ICD-10-CM | POA: Diagnosis not present

## 2017-09-03 DIAGNOSIS — J449 Chronic obstructive pulmonary disease, unspecified: Secondary | ICD-10-CM | POA: Diagnosis not present

## 2017-09-03 DIAGNOSIS — D509 Iron deficiency anemia, unspecified: Secondary | ICD-10-CM | POA: Diagnosis not present

## 2017-09-03 DIAGNOSIS — E119 Type 2 diabetes mellitus without complications: Secondary | ICD-10-CM | POA: Diagnosis not present

## 2017-09-03 DIAGNOSIS — I5031 Acute diastolic (congestive) heart failure: Secondary | ICD-10-CM | POA: Diagnosis not present

## 2017-09-04 DIAGNOSIS — J449 Chronic obstructive pulmonary disease, unspecified: Secondary | ICD-10-CM | POA: Diagnosis not present

## 2017-10-04 DIAGNOSIS — J449 Chronic obstructive pulmonary disease, unspecified: Secondary | ICD-10-CM | POA: Diagnosis not present

## 2017-11-04 DIAGNOSIS — J449 Chronic obstructive pulmonary disease, unspecified: Secondary | ICD-10-CM | POA: Diagnosis not present

## 2017-11-04 DIAGNOSIS — E119 Type 2 diabetes mellitus without complications: Secondary | ICD-10-CM | POA: Diagnosis not present

## 2017-12-04 DIAGNOSIS — J449 Chronic obstructive pulmonary disease, unspecified: Secondary | ICD-10-CM | POA: Diagnosis not present

## 2018-01-01 DIAGNOSIS — E119 Type 2 diabetes mellitus without complications: Secondary | ICD-10-CM | POA: Diagnosis not present

## 2018-01-04 DIAGNOSIS — J449 Chronic obstructive pulmonary disease, unspecified: Secondary | ICD-10-CM | POA: Diagnosis not present

## 2018-01-30 DIAGNOSIS — I10 Essential (primary) hypertension: Secondary | ICD-10-CM | POA: Diagnosis not present

## 2018-01-30 DIAGNOSIS — E119 Type 2 diabetes mellitus without complications: Secondary | ICD-10-CM | POA: Diagnosis not present

## 2018-01-30 DIAGNOSIS — D509 Iron deficiency anemia, unspecified: Secondary | ICD-10-CM | POA: Diagnosis not present

## 2018-01-30 DIAGNOSIS — E782 Mixed hyperlipidemia: Secondary | ICD-10-CM | POA: Diagnosis not present

## 2018-02-03 DIAGNOSIS — E119 Type 2 diabetes mellitus without complications: Secondary | ICD-10-CM | POA: Diagnosis not present

## 2018-02-04 DIAGNOSIS — J9601 Acute respiratory failure with hypoxia: Secondary | ICD-10-CM | POA: Diagnosis not present

## 2018-02-04 DIAGNOSIS — E119 Type 2 diabetes mellitus without complications: Secondary | ICD-10-CM | POA: Diagnosis not present

## 2018-02-04 DIAGNOSIS — J449 Chronic obstructive pulmonary disease, unspecified: Secondary | ICD-10-CM | POA: Diagnosis not present

## 2018-03-04 DIAGNOSIS — J449 Chronic obstructive pulmonary disease, unspecified: Secondary | ICD-10-CM | POA: Diagnosis not present

## 2018-04-04 DIAGNOSIS — J449 Chronic obstructive pulmonary disease, unspecified: Secondary | ICD-10-CM | POA: Diagnosis not present

## 2018-04-07 NOTE — Progress Notes (Signed)
Cardiology Office Note  Date: 04/08/2018   ID: DRAKE LANDING, DOB 03-28-1933, MRN [id number]  PCP: Celene Squibb, MD  Primary Cardiologist: Rozann Lesches, MD   Chief Complaint  Patient presents with  . Coronary Artery Disease    History of Present Illness: [name] is an 84 y.o. female last seen in February 2017.  She presents for a follow-up visit.  She is here with her daughter.  She does not report any angina symptoms at this time or nitroglycerin use.  She is fairly sedentary, uses a rolling walker and reports leg weakness that has been getting gradually worse.  Not specifically claudication.  She wonders whether it could be related to Crestor.  Follow-up echocardiogram from November 2017 revealed LVEF 60 to 65% with grade 2 diastolic dysfunction.  I personally reviewed her ECG today which shows a sinus rhythm with possible old inferior infarct pattern.  I reviewed her current medications which are outlined below.  Requesting most recent lipid panel from Dr. Nevada Crane.  Past Medical History:  Diagnosis Date  . Coronary atherosclerosis of native coronary artery    Multivessel status post CABG  . Depression   . Glaucoma   . Hydrocephalus   . Hyperlipidemia   . Nephrolithiasis   . Spinal stenosis   . Type 2 diabetes mellitus ([place])     Past Surgical History:  Procedure Laterality Date  . APPENDECTOMY    . BACK SURGERY    . CORONARY ARTERY BYPASS GRAFT  11/24/1991   SVG to OM1, SVG to RCA  . FOOT SURGERY    . IR VERTEBROPLASTY CERV/THOR BX INC UNI/BIL INC/INJECT/IMAGING  04/19/2017  . KYPHOPLASTY    . VENTRICULOPERITONEAL SHUNT      Current Outpatient Medications  Medication Sig Dispense Refill  . albuterol (PROVENTIL) (2.5 MG/3ML) 0.083% nebulizer solution Take 3 mLs (2.5 mg total) by nebulization every 6 (six) hours as needed for wheezing or shortness of breath. ICD 10 code J44.9 75 mL 12  . aspirin EC 81 MG tablet Take 81 mg by mouth daily.    Marland Kitchen BREO  ELLIPTA 100-25 MCG/INH AEPB INHALE 1 PUFF INTO THE LUNGS EVERY DAY  12  . budesonide-formoterol (SYMBICORT) 160-4.5 MCG/ACT inhaler Inhale 2 puffs into the lungs 2 (two) times daily.    . Choline Fenofibrate 135 MG capsule Take 135 mg by mouth daily.    . compounded topicals builder Apply 1 application topically 3 (three) times daily. (Lidocaine, Menthol, Diclofenac, Baclofen)    . dorzolamide-timolol (COSOPT) 22.3-6.8 MG/ML ophthalmic solution Place 1 drop into both eyes 2 (two) times daily.     Marland Kitchen gabapentin (NEURONTIN) 100 MG capsule Take 100 mg by mouth 3 (three) times daily.     Marland Kitchen latanoprost (XALATAN) 0.005 % ophthalmic solution Place 1 drop into both eyes at bedtime.    Marland Kitchen lubiprostone (AMITIZA) 24 MCG capsule Take 24 mcg by mouth 2 (two) times daily with a meal.    . Milnacipran (SAVELLA) 50 MG TABS Take 50 mg by mouth 2 (two) times daily.    . Multiple Vitamins-Minerals (MULTIVITAMIN WITH MINERALS) tablet Take 1 tablet by mouth daily.     . nitroGLYCERIN (NITROSTAT) 0.4 MG SL tablet Place 1 tablet (0.4 mg total) under the tongue every 5 (five) minutes as needed for chest pain. 25 tablet 3  . Omega 3 1200 MG CAPS Take 1 capsule by mouth 3 (three) times daily.    . pantoprazole (PROTONIX) 40 MG tablet Take 40  mg by mouth daily.     . pioglitazone (ACTOS) 15 MG tablet Take 15 mg by mouth daily.     . polyethylene glycol (MIRALAX / GLYCOLAX) packet Take 17 g by mouth at bedtime.    Marland Kitchen rOPINIRole (REQUIP) 1 MG tablet Take 1 mg by mouth at bedtime.    . rosuvastatin (CRESTOR) 40 MG tablet Take 40 mg by mouth every evening.     . sitaGLIPtan-metformin (JANUMET) 50-1000 MG per tablet Take 1 tablet by mouth 2 (two) times daily with a meal.    . traMADol (ULTRAM) 50 MG tablet Take 50 mg by mouth every 6 (six) hours as needed for moderate pain.     No current facility-administered medications for this visit.    Allergies:  Codeine; Penicillins; and Sulfa antibiotics   Social History: The patient   reports that she quit smoking about 24 years ago. Her smoking use included cigarettes. She started smoking about 60 years ago. She smoked 2.00 packs per day. She has never used smokeless tobacco. She reports that she does not drink alcohol or use drugs.   ROS:  Please see the history of present illness. Otherwise, complete review of systems is positive for hearing loss, arthritic stiffness.  All other systems are reviewed and negative.   Physical Exam: VS:  BP 110/76   Pulse 95   Ht 5' 3.5" (1.613 m)   Wt 171 lb (77.6 kg)   BMI 29.82 kg/m , BMI Body mass index is 29.82 kg/m.  Wt Readings from Last 3 Encounters:  04/08/18 171 lb (77.6 kg)  04/19/17 162 lb (73.5 kg)  03/02/17 171 lb (77.6 kg)    General: Elderly woman, appears comfortable at rest.  Using rolling walker. HEENT: Conjunctiva and lids normal, oropharynx clear. Neck: Supple, no elevated JVP or carotid bruits, no thyromegaly. Lungs: Clear to auscultation, nonlabored breathing at rest. Cardiac: Regular rate and rhythm, no S3, 2/6 systolic murmur. Abdomen: Soft, nontender, bowel sounds present. Extremities: No pitting edema, distal pulses 2+. Skin: Warm and dry. Musculoskeletal: No kyphosis. Neuropsychiatric: Alert and oriented x3, affect grossly appropriate.  ECG: I personally reviewed the tracing from 10/27/2016 which showed sinus tachycardia with left atrial enlargement, rule out old inferior infarct pattern.  Recent Labwork: 04/19/2017: ALT 19; AST 23; BUN 24; Creatinine, Ser 0.77; Hemoglobin 11.7; Platelets 317; Potassium 3.8; Sodium 141   Other Studies Reviewed Today:  Persantine Myoview June 2012: Evidence of mild basal inferior and inferolateral ischemia with LVEF 65%, overall low risk.   Echocardiogram 10/28/2016: Study Conclusions  - Left ventricle: The cavity size was normal. Wall thickness was   increased in a pattern of mild LVH. Systolic function was normal.   The estimated ejection fraction was in the  range of 60% to 65%.   Wall motion was normal; there were no regional wall motion   abnormalities. Features are consistent with a pseudonormal left   ventricular filling pattern, with concomitant abnormal relaxation   and increased filling pressure (grade 2 diastolic dysfunction). - Atrial septum: There was increased thickness of the septum,   consistent with lipomatous hypertrophy. - Pulmonary arteries: PA peak pressure: 35 mm Hg (S).  Assessment and Plan:  1.  Multivessel CAD status post CABG.  LVEF 60 to 65% range.  Last formal ischemic testing was in 2012.  She does not report any angina on medical therapy or increasing nitroglycerin use.  We have discussed continuing with observation unless symptoms intervene.  2.  Mixed hyperlipidemia on Crestor.  Requesting most recent lab work from Dr. Nevada Crane.  I also asked her to cut her Crestor dose from 40 mg to 20 mg daily to see if this makes any impact on her leg weakness.  If not, would resume the prior dose.  Could be related to known history of spinal stenosis.  3.  Type 2 diabetes mellitus, follows with Dr. Nevada Crane.  She is on Janumet and Actos.  Current medicines were reviewed with the patient today.   Orders Placed This Encounter  Procedures  . EKG 12-Lead    Disposition: Follow-up in 1 year.  Signed, Satira Sark, MD, [facility] 04/08/2018 11:22 AM    [facility] [place], [place], [place] [address] Phone: [phone]; Fax: [phone]

## 2018-04-08 ENCOUNTER — Encounter: Payer: Self-pay | Admitting: Cardiology

## 2018-04-08 ENCOUNTER — Encounter: Payer: Self-pay | Admitting: *Deleted

## 2018-04-08 ENCOUNTER — Ambulatory Visit: Payer: Medicare Other | Admitting: Cardiology

## 2018-04-08 VITALS — BP 110/76 | HR 95 | Ht 63.5 in | Wt 171.0 lb

## 2018-04-08 DIAGNOSIS — E118 Type 2 diabetes mellitus with unspecified complications: Secondary | ICD-10-CM

## 2018-04-08 DIAGNOSIS — R269 Unspecified abnormalities of gait and mobility: Secondary | ICD-10-CM | POA: Insufficient documentation

## 2018-04-08 DIAGNOSIS — I25119 Atherosclerotic heart disease of native coronary artery with unspecified angina pectoris: Secondary | ICD-10-CM

## 2018-04-08 DIAGNOSIS — G919 Hydrocephalus, unspecified: Secondary | ICD-10-CM | POA: Insufficient documentation

## 2018-04-08 DIAGNOSIS — E782 Mixed hyperlipidemia: Secondary | ICD-10-CM

## 2018-04-08 NOTE — Patient Instructions (Signed)
Medication Instructions:  TAKE 1/2 CRESTOR TO SEE IF THAT HELPS WITH YOUR MUSCLE WEAKNESS- LET DR. HALL KNOW IF THIS HELS  Labwork: I WILL REQUEST LABS FORM PCP   Testing/Procedures: NONE  Follow-Up: Your physician wants you to follow-up in: 1 YEAR. You will receive a reminder letter in the mail two months in advance. If you don't receive a letter, please call our office to schedule the follow-up appointment.   Any Other Special Instructions Will Be Listed Below (If Applicable).     If you need a refill on your cardiac medications before your next appointment, please call your pharmacy.

## 2018-04-09 DIAGNOSIS — Z961 Presence of intraocular lens: Secondary | ICD-10-CM | POA: Diagnosis not present

## 2018-04-09 DIAGNOSIS — E119 Type 2 diabetes mellitus without complications: Secondary | ICD-10-CM | POA: Diagnosis not present

## 2018-04-09 DIAGNOSIS — H401132 Primary open-angle glaucoma, bilateral, moderate stage: Secondary | ICD-10-CM | POA: Diagnosis not present

## 2018-04-09 DIAGNOSIS — H40053 Ocular hypertension, bilateral: Secondary | ICD-10-CM | POA: Diagnosis not present

## 2018-05-01 DIAGNOSIS — E119 Type 2 diabetes mellitus without complications: Secondary | ICD-10-CM | POA: Diagnosis not present

## 2018-05-04 DIAGNOSIS — J449 Chronic obstructive pulmonary disease, unspecified: Secondary | ICD-10-CM | POA: Diagnosis not present

## 2018-05-13 DIAGNOSIS — E119 Type 2 diabetes mellitus without complications: Secondary | ICD-10-CM | POA: Diagnosis not present

## 2018-05-15 DIAGNOSIS — J9601 Acute respiratory failure with hypoxia: Secondary | ICD-10-CM | POA: Diagnosis not present

## 2018-05-15 DIAGNOSIS — H4010X0 Unspecified open-angle glaucoma, stage unspecified: Secondary | ICD-10-CM | POA: Diagnosis not present

## 2018-05-15 DIAGNOSIS — E782 Mixed hyperlipidemia: Secondary | ICD-10-CM | POA: Diagnosis not present

## 2018-05-15 DIAGNOSIS — E119 Type 2 diabetes mellitus without complications: Secondary | ICD-10-CM | POA: Diagnosis not present

## 2018-05-15 DIAGNOSIS — D509 Iron deficiency anemia, unspecified: Secondary | ICD-10-CM | POA: Diagnosis not present

## 2018-05-16 ENCOUNTER — Other Ambulatory Visit (HOSPITAL_COMMUNITY): Payer: Self-pay | Admitting: Internal Medicine

## 2018-05-16 DIAGNOSIS — Z780 Asymptomatic menopausal state: Secondary | ICD-10-CM

## 2018-05-27 ENCOUNTER — Other Ambulatory Visit (HOSPITAL_COMMUNITY): Payer: Medicare Other

## 2018-05-28 ENCOUNTER — Other Ambulatory Visit (HOSPITAL_COMMUNITY): Payer: Medicare Other

## 2018-05-30 ENCOUNTER — Ambulatory Visit (HOSPITAL_COMMUNITY)
Admission: RE | Admit: 2018-05-30 | Discharge: 2018-05-30 | Disposition: A | Discharge status: Home / Self Care | Payer: Medicare Other | Source: Ambulatory Visit | Attending: Internal Medicine | Admitting: Internal Medicine

## 2018-05-30 DIAGNOSIS — M810 Age-related osteoporosis without current pathological fracture: Secondary | ICD-10-CM | POA: Diagnosis not present

## 2018-05-30 DIAGNOSIS — Z780 Asymptomatic menopausal state: Secondary | ICD-10-CM | POA: Diagnosis not present

## 2018-06-04 DIAGNOSIS — J449 Chronic obstructive pulmonary disease, unspecified: Secondary | ICD-10-CM | POA: Diagnosis not present

## 2018-06-16 DIAGNOSIS — H40053 Ocular hypertension, bilateral: Secondary | ICD-10-CM | POA: Diagnosis not present

## 2018-06-16 DIAGNOSIS — H401132 Primary open-angle glaucoma, bilateral, moderate stage: Secondary | ICD-10-CM | POA: Diagnosis not present

## 2018-07-04 DIAGNOSIS — J449 Chronic obstructive pulmonary disease, unspecified: Secondary | ICD-10-CM | POA: Diagnosis not present

## 2018-07-22 DIAGNOSIS — R531 Weakness: Secondary | ICD-10-CM | POA: Diagnosis not present

## 2018-07-22 DIAGNOSIS — M810 Age-related osteoporosis without current pathological fracture: Secondary | ICD-10-CM | POA: Diagnosis not present

## 2018-07-25 ENCOUNTER — Emergency Department (HOSPITAL_COMMUNITY): Payer: Medicare Other

## 2018-07-25 ENCOUNTER — Inpatient Hospital Stay (HOSPITAL_COMMUNITY)
Admission: EM | Admit: 2018-07-25 | Discharge: 2018-07-27 | DRG: 189 | Disposition: A | Discharge status: Home / Self Care | Payer: Medicare Other | Attending: Internal Medicine | Admitting: Internal Medicine

## 2018-07-25 ENCOUNTER — Encounter (HOSPITAL_COMMUNITY): Payer: Self-pay | Admitting: Emergency Medicine

## 2018-07-25 ENCOUNTER — Other Ambulatory Visit: Payer: Self-pay

## 2018-07-25 DIAGNOSIS — J441 Chronic obstructive pulmonary disease with (acute) exacerbation: Secondary | ICD-10-CM | POA: Diagnosis not present

## 2018-07-25 DIAGNOSIS — Z9981 Dependence on supplemental oxygen: Secondary | ICD-10-CM

## 2018-07-25 DIAGNOSIS — E662 Morbid (severe) obesity with alveolar hypoventilation: Secondary | ICD-10-CM | POA: Diagnosis present

## 2018-07-25 DIAGNOSIS — Z8249 Family history of ischemic heart disease and other diseases of the circulatory system: Secondary | ICD-10-CM

## 2018-07-25 DIAGNOSIS — R410 Disorientation, unspecified: Secondary | ICD-10-CM | POA: Diagnosis present

## 2018-07-25 DIAGNOSIS — Z8349 Family history of other endocrine, nutritional and metabolic diseases: Secondary | ICD-10-CM

## 2018-07-25 DIAGNOSIS — Z87442 Personal history of urinary calculi: Secondary | ICD-10-CM

## 2018-07-25 DIAGNOSIS — E785 Hyperlipidemia, unspecified: Secondary | ICD-10-CM | POA: Diagnosis not present

## 2018-07-25 DIAGNOSIS — Z6828 Body mass index (BMI) 28.0-28.9, adult: Secondary | ICD-10-CM

## 2018-07-25 DIAGNOSIS — J9622 Acute and chronic respiratory failure with hypercapnia: Principal | ICD-10-CM | POA: Diagnosis present

## 2018-07-25 DIAGNOSIS — Z882 Allergy status to sulfonamides status: Secondary | ICD-10-CM

## 2018-07-25 DIAGNOSIS — K5900 Constipation, unspecified: Secondary | ICD-10-CM | POA: Diagnosis not present

## 2018-07-25 DIAGNOSIS — Z833 Family history of diabetes mellitus: Secondary | ICD-10-CM | POA: Diagnosis not present

## 2018-07-25 DIAGNOSIS — N390 Urinary tract infection, site not specified: Secondary | ICD-10-CM

## 2018-07-25 DIAGNOSIS — H409 Unspecified glaucoma: Secondary | ICD-10-CM | POA: Diagnosis not present

## 2018-07-25 DIAGNOSIS — Z7951 Long term (current) use of inhaled steroids: Secondary | ICD-10-CM

## 2018-07-25 DIAGNOSIS — R05 Cough: Secondary | ICD-10-CM | POA: Diagnosis not present

## 2018-07-25 DIAGNOSIS — I2510 Atherosclerotic heart disease of native coronary artery without angina pectoris: Secondary | ICD-10-CM | POA: Diagnosis present

## 2018-07-25 DIAGNOSIS — E1140 Type 2 diabetes mellitus with diabetic neuropathy, unspecified: Secondary | ICD-10-CM | POA: Diagnosis not present

## 2018-07-25 DIAGNOSIS — Z951 Presence of aortocoronary bypass graft: Secondary | ICD-10-CM

## 2018-07-25 DIAGNOSIS — Z79899 Other long term (current) drug therapy: Secondary | ICD-10-CM

## 2018-07-25 DIAGNOSIS — Z87891 Personal history of nicotine dependence: Secondary | ICD-10-CM

## 2018-07-25 DIAGNOSIS — Z982 Presence of cerebrospinal fluid drainage device: Secondary | ICD-10-CM

## 2018-07-25 DIAGNOSIS — G919 Hydrocephalus, unspecified: Secondary | ICD-10-CM | POA: Diagnosis not present

## 2018-07-25 DIAGNOSIS — I10 Essential (primary) hypertension: Secondary | ICD-10-CM | POA: Diagnosis not present

## 2018-07-25 DIAGNOSIS — Z885 Allergy status to narcotic agent status: Secondary | ICD-10-CM

## 2018-07-25 DIAGNOSIS — Z9049 Acquired absence of other specified parts of digestive tract: Secondary | ICD-10-CM

## 2018-07-25 DIAGNOSIS — Z880 Allergy status to penicillin: Secondary | ICD-10-CM

## 2018-07-25 DIAGNOSIS — G9341 Metabolic encephalopathy: Secondary | ICD-10-CM | POA: Diagnosis present

## 2018-07-25 DIAGNOSIS — Z7982 Long term (current) use of aspirin: Secondary | ICD-10-CM | POA: Diagnosis not present

## 2018-07-25 DIAGNOSIS — Z7984 Long term (current) use of oral hypoglycemic drugs: Secondary | ICD-10-CM

## 2018-07-25 DIAGNOSIS — R52 Pain, unspecified: Secondary | ICD-10-CM | POA: Diagnosis not present

## 2018-07-25 DIAGNOSIS — J9612 Chronic respiratory failure with hypercapnia: Secondary | ICD-10-CM | POA: Diagnosis not present

## 2018-07-25 LAB — BLOOD GAS, ARTERIAL
Acid-Base Excess: 11.1 mmol/L — ABNORMAL HIGH (ref 0.0–2.0)
Acid-Base Excess: 9.9 mmol/L — ABNORMAL HIGH (ref 0.0–2.0)
BICARBONATE: 33.2 mmol/L — AB (ref 20.0–28.0)
Bicarbonate: 31.9 mmol/L — ABNORMAL HIGH (ref 20.0–28.0)
DELIVERY SYSTEMS: POSITIVE
Drawn by: 270161
Drawn by: 105551
EXPIRATORY PAP: 7
FIO2: 50
INSPIRATORY PAP: 14
LHR: 12 {breaths}/min
MECHANICAL RATE: 12
O2 CONTENT: 3 L/min
O2 SAT: 93.6 %
O2 Saturation: 92.8 %
PATIENT TEMPERATURE: 37
PCO2 ART: 94.1 mmHg — AB (ref 32.0–48.0)
PO2 ART: 80.2 mmHg — AB (ref 83.0–108.0)
PO2 ART: 77 mmHg — AB (ref 83.0–108.0)
pCO2 arterial: 93.2 mmHg (ref 32.0–48.0)
pH, Arterial: 7.237 — ABNORMAL LOW (ref 7.350–7.450)
pH, Arterial: 7.228 — ABNORMAL LOW (ref 7.350–7.450)

## 2018-07-25 LAB — CBC WITH DIFFERENTIAL/PLATELET
BASOS ABS: 0 10*3/uL (ref 0.0–0.1)
Basophils Absolute: 0 10*3/uL (ref 0.0–0.1)
Basophils Relative: 0 %
Basophils Relative: 0 %
EOS ABS: 0.1 10*3/uL (ref 0.0–0.7)
EOS PCT: 1 %
EOS PCT: 1 %
Eosinophils Absolute: 0.1 10*3/uL (ref 0.0–0.7)
HCT: 33.3 % — ABNORMAL LOW (ref 36.0–46.0)
HEMATOCRIT: 34.6 % — AB (ref 36.0–46.0)
Hemoglobin: 8.9 g/dL — ABNORMAL LOW (ref 12.0–15.0)
Hemoglobin: 9.1 g/dL — ABNORMAL LOW (ref 12.0–15.0)
LYMPHS ABS: 1.2 10*3/uL (ref 0.7–4.0)
LYMPHS PCT: 16 %
LYMPHS PCT: 18 %
Lymphs Abs: 1.4 10*3/uL (ref 0.7–4.0)
MCH: 25.5 pg — AB (ref 26.0–34.0)
MCH: 25.2 pg — ABNORMAL LOW (ref 26.0–34.0)
MCHC: 26.7 g/dL — ABNORMAL LOW (ref 30.0–36.0)
MCHC: 26.3 g/dL — ABNORMAL LOW (ref 30.0–36.0)
MCV: 95.4 fL (ref 78.0–100.0)
MCV: 95.8 fL (ref 78.0–100.0)
Monocytes Absolute: 0.8 10*3/uL (ref 0.1–1.0)
Monocytes Absolute: 0.8 10*3/uL (ref 0.1–1.0)
Monocytes Relative: 11 %
Monocytes Relative: 10 %
NEUTROS ABS: 5.5 10*3/uL (ref 1.7–7.7)
NEUTROS PCT: 71 %
Neutro Abs: 5.3 10*3/uL (ref 1.7–7.7)
Neutrophils Relative %: 72 %
PLATELETS: 390 10*3/uL (ref 150–400)
PLATELETS: 358 10*3/uL (ref 150–400)
RBC: 3.49 MIL/uL — ABNORMAL LOW (ref 3.87–5.11)
RBC: 3.61 MIL/uL — AB (ref 3.87–5.11)
RDW: 16.1 % — ABNORMAL HIGH (ref 11.5–15.5)
RDW: 16 % — ABNORMAL HIGH (ref 11.5–15.5)
WBC: 7.4 10*3/uL (ref 4.0–10.5)
WBC: 7.8 10*3/uL (ref 4.0–10.5)

## 2018-07-25 LAB — GLUCOSE, CAPILLARY
GLUCOSE-CAPILLARY: 170 mg/dL — AB (ref 70–99)
GLUCOSE-CAPILLARY: 213 mg/dL — AB (ref 70–99)
Glucose-Capillary: 243 mg/dL — ABNORMAL HIGH (ref 70–99)

## 2018-07-25 LAB — URINALYSIS, ROUTINE W REFLEX MICROSCOPIC
BILIRUBIN URINE: NEGATIVE
GLUCOSE, UA: NEGATIVE mg/dL
Hgb urine dipstick: NEGATIVE
KETONES UR: NEGATIVE mg/dL
NITRITE: NEGATIVE
PH: 6 (ref 5.0–8.0)
Protein, ur: NEGATIVE mg/dL
Specific Gravity, Urine: 1.018 (ref 1.005–1.030)

## 2018-07-25 LAB — COMPREHENSIVE METABOLIC PANEL
ALT: 14 U/L (ref 0–44)
ANION GAP: 8 (ref 5–15)
AST: 21 U/L (ref 15–41)
Albumin: 3.7 g/dL (ref 3.5–5.0)
Alkaline Phosphatase: 41 U/L (ref 38–126)
BILIRUBIN TOTAL: 0.5 mg/dL (ref 0.3–1.2)
BUN: 22 mg/dL (ref 8–23)
CO2: 38 mmol/L — ABNORMAL HIGH (ref 22–32)
Calcium: 9.9 mg/dL (ref 8.9–10.3)
Chloride: 94 mmol/L — ABNORMAL LOW (ref 98–111)
Creatinine, Ser: 0.66 mg/dL (ref 0.44–1.00)
Glucose, Bld: 121 mg/dL — ABNORMAL HIGH (ref 70–99)
POTASSIUM: 4.8 mmol/L (ref 3.5–5.1)
Sodium: 140 mmol/L (ref 135–145)
Total Protein: 8 g/dL (ref 6.5–8.1)

## 2018-07-25 LAB — MRSA PCR SCREENING: MRSA BY PCR: NEGATIVE

## 2018-07-25 LAB — TROPONIN I: Troponin I: <0.03 ng/mL (ref <0.03)

## 2018-07-25 MED ORDER — CHLORHEXIDINE GLUCONATE 0.12 % MT SOLN
15.0000 mL | Freq: Two times a day (BID) | OROMUCOSAL | Status: DC
  Administered 2018-07-25 – 2018-07-27 (×4): 15 mL via OROMUCOSAL
  Filled 2018-07-25 (×4): qty 15

## 2018-07-25 MED ORDER — SODIUM CHLORIDE 0.9% FLUSH
3.0000 mL | Freq: Two times a day (BID) | INTRAVENOUS | Status: DC
  Administered 2018-07-26 – 2018-07-27 (×4): 3 mL via INTRAVENOUS

## 2018-07-25 MED ORDER — METHYLPREDNISOLONE SODIUM SUCC 125 MG IJ SOLR
80.0000 mg | Freq: Two times a day (BID) | INTRAMUSCULAR | Status: AC
  Administered 2018-07-25 – 2018-07-26 (×2): 80 mg via INTRAVENOUS
  Filled 2018-07-25 (×2): qty 2

## 2018-07-25 MED ORDER — ALBUTEROL SULFATE (2.5 MG/3ML) 0.083% IN NEBU: 2.5000 mg | INHALATION_SOLUTION | RESPIRATORY_TRACT | Status: DC | PRN

## 2018-07-25 MED ORDER — ORAL CARE MOUTH RINSE
15.0000 mL | Freq: Two times a day (BID) | OROMUCOSAL | Status: DC
  Administered 2018-07-26: 15 mL via OROMUCOSAL

## 2018-07-25 MED ORDER — INSULIN ASPART 100 UNIT/ML ~~LOC~~ SOLN
0.0000 [IU] | SUBCUTANEOUS | Status: DC
  Administered 2018-07-25 (×3): 3 [IU] via SUBCUTANEOUS
  Administered 2018-07-26: 1 [IU] via SUBCUTANEOUS
  Administered 2018-07-26: 3 [IU] via SUBCUTANEOUS
  Administered 2018-07-26: 1 [IU] via SUBCUTANEOUS
  Administered 2018-07-26: 2 [IU] via SUBCUTANEOUS
  Administered 2018-07-26: 5 [IU] via SUBCUTANEOUS
  Administered 2018-07-27: 1 [IU] via SUBCUTANEOUS

## 2018-07-25 MED ORDER — SODIUM CHLORIDE 0.9 % IV SOLN: 250.0000 mL | INTRAVENOUS | Status: DC | PRN

## 2018-07-25 MED ORDER — IPRATROPIUM-ALBUTEROL 0.5-2.5 (3) MG/3ML IN SOLN
3.0000 mL | Freq: Once | RESPIRATORY_TRACT | Status: AC
  Administered 2018-07-25: 3 mL via RESPIRATORY_TRACT
  Filled 2018-07-25: qty 3

## 2018-07-25 MED ORDER — SODIUM CHLORIDE 0.9 % IV SOLN
1.0000 g | INTRAVENOUS | Status: DC
  Filled 2018-07-25 (×2): qty 10

## 2018-07-25 MED ORDER — SODIUM CHLORIDE 0.9 % IV SOLN
1.0000 g | Freq: Once | INTRAVENOUS | Status: AC
  Administered 2018-07-25: 1 g via INTRAVENOUS
  Filled 2018-07-25: qty 10

## 2018-07-25 MED ORDER — SODIUM CHLORIDE 0.9% FLUSH: 3.0000 mL | INTRAVENOUS | Status: DC | PRN

## 2018-07-25 MED ORDER — METHYLPREDNISOLONE SODIUM SUCC 125 MG IJ SOLR
125.0000 mg | Freq: Once | INTRAMUSCULAR | Status: AC
  Administered 2018-07-25: 125 mg via INTRAVENOUS
  Filled 2018-07-25: qty 2

## 2018-07-25 MED ORDER — IPRATROPIUM-ALBUTEROL 0.5-2.5 (3) MG/3ML IN SOLN
3.0000 mL | Freq: Four times a day (QID) | RESPIRATORY_TRACT | Status: DC
  Administered 2018-07-25 – 2018-07-27 (×7): 3 mL via RESPIRATORY_TRACT
  Filled 2018-07-25 (×8): qty 3

## 2018-07-25 MED ORDER — CEFTRIAXONE SODIUM 1 G IJ SOLR
1.0000 g | INTRAMUSCULAR | Status: DC
  Administered 2018-07-26: 1 g via INTRAVENOUS
  Filled 2018-07-25 (×3): qty 10

## 2018-07-25 NOTE — ED Notes (Signed)
Call to respiratory 

## 2018-07-25 NOTE — ED Notes (Signed)
Meal provided 

## 2018-07-25 NOTE — ED Notes (Signed)
Meal provided  Pt ate most of meal

## 2018-07-25 NOTE — ED Triage Notes (Addendum)
Pt c/o generalized body aches x 1-2 days. No BM in 3 days. Pt wears 2L nasal cannula at home. Denies fever. Denies urinary symptoms for GI/GU symptoms. Initial call to EMS was for confusion. No hx of dementia.

## 2018-07-25 NOTE — ED Notes (Signed)
Report to [place], RN   Awaiting RT to transport pt to ICU

## 2018-07-25 NOTE — ED Provider Notes (Signed)
[facility] EMERGENCY DEPARTMENT Provider Note   CSN: [id number] Arrival date & time: 07/25/18  1308     History   Chief Complaint Chief Complaint  Patient presents with  . Generalized Body Aches    HPI [name] is a 84 y.o. female.  Level 5 caveat for altered mental status.She is brought in by her daughter today for 2 days of increased sleepiness.  She has chronic COPD O2 dependent 2 L.  She lives by herself.  The daughters noticed that her speech is more slurred and that she falls asleep easily similar to a few years ago when she was retaining carbon dioxide.  The patient herself only complains of having body aches.  She also has not moved her bowels in a couple of days.  She is had a chronic cough productive of some green phlegm.  She denies any fever or chest pain or abdominal pain.  No nausea no vomiting no urinary symptoms.  She uses a walker and denies a recent fall.  The history is provided by the patient and a relative.  Altered Mental Status   This is a new problem. The current episode started yesterday. The problem has not changed since onset.Associated symptoms include confusion and somnolence. Pertinent negatives include no seizures, no unresponsiveness, no weakness, no agitation, no delusions, no hallucinations, no self-injury and no violence. Her past medical history is significant for diabetes, COPD and heart disease.    Past Medical History:  Diagnosis Date  . Coronary atherosclerosis of native coronary artery    Multivessel status post CABG  . Depression   . Glaucoma   . Hydrocephalus   . Hyperlipidemia   . Nephrolithiasis   . Spinal stenosis   . Type 2 diabetes mellitus [facility])     Patient Active Problem List   Diagnosis Date Noted  . Abnormality of gait and mobility 04/08/2018  . Hydrocephalus 04/08/2018  . Acute respiratory failure with hypercapnia ([place]) 10/29/2016  . Acute diastolic heart failure ([place]) 10/29/2016  . Essential hypertension  10/29/2016  . Normocytic anemia 10/29/2016  . Diabetes mellitus with neuropathy ([place]) 10/29/2016  . COPD with exacerbation ([place]) 10/29/2016  . CAP (community acquired pneumonia) 10/26/2016  . Body mass index (BMI) of 30.0-30.9 in adult 11/23/2015  . Coronary atherosclerosis of native coronary artery 09/11/2013  . Mixed hyperlipidemia 09/11/2013  . Carotid artery occlusion without infarction 09/11/2013  . Difficulty walking 06/24/2013    Past Surgical History:  Procedure Laterality Date  . APPENDECTOMY    . BACK SURGERY    . CORONARY ARTERY BYPASS GRAFT  11/24/1991   SVG to OM1, SVG to RCA  . FOOT SURGERY    . IR VERTEBROPLASTY CERV/THOR BX INC UNI/BIL INC/INJECT/IMAGING  04/19/2017  . KYPHOPLASTY    . VENTRICULOPERITONEAL SHUNT       OB History    Gravida  3   Para  3   Term      Preterm      AB      Living  3     SAB      TAB      Ectopic      Multiple      Live Births               Home Medications    Prior to Admission medications   Medication Sig Start Date End Date Taking? Authorizing Provider  albuterol (PROVENTIL) (2.5 MG/3ML) 0.083% nebulizer solution Take 3 mLs (2.5 mg total) by  nebulization every 6 (six) hours as needed for wheezing or shortness of breath. ICD 10 code J44.9 11/04/16   Kathie Dike, MD  aspirin EC 81 MG tablet Take 81 mg by mouth daily.    [provider]  BREO ELLIPTA 100-25 MCG/INH AEPB INHALE 1 PUFF INTO THE LUNGS EVERY DAY 02/16/18   [provider]  budesonide-formoterol (SYMBICORT) 160-4.5 MCG/ACT inhaler Inhale 2 puffs into the lungs 2 (two) times daily.    [provider]  Choline Fenofibrate 135 MG capsule Take 135 mg by mouth daily.    [provider]  compounded topicals builder Apply 1 application topically 3 (three) times daily. (Lidocaine, Menthol, Diclofenac, Baclofen)    [provider]  dorzolamide-timolol (COSOPT) 22.3-6.8 MG/ML ophthalmic solution Place 1 drop into  both eyes 2 (two) times daily.  10/30/14   [provider]  gabapentin (NEURONTIN) 100 MG capsule Take 100 mg by mouth 3 (three) times daily.  09/29/14   [provider]  latanoprost (XALATAN) 0.005 % ophthalmic solution Place 1 drop into both eyes at bedtime.    [provider]  lubiprostone (AMITIZA) 24 MCG capsule Take 24 mcg by mouth 2 (two) times daily with a meal.    [provider]  Milnacipran (SAVELLA) 50 MG TABS Take 50 mg by mouth 2 (two) times daily.    [provider]  Multiple Vitamins-Minerals (MULTIVITAMIN WITH MINERALS) tablet Take 1 tablet by mouth daily.     [provider]  nitroGLYCERIN (NITROSTAT) 0.4 MG SL tablet Place 1 tablet (0.4 mg total) under the tongue every 5 (five) minutes as needed for chest pain. 02/03/16   Satira Sark, MD  Omega 3 1200 MG CAPS Take 1 capsule by mouth 3 (three) times daily.    [provider]  pantoprazole (PROTONIX) 40 MG tablet Take 40 mg by mouth daily.  08/31/13   [provider]  pioglitazone (ACTOS) 15 MG tablet Take 15 mg by mouth daily.  10/03/14   [provider]  polyethylene glycol (MIRALAX / GLYCOLAX) packet Take 17 g by mouth at bedtime.    [provider]  rOPINIRole (REQUIP) 1 MG tablet Take 1 mg by mouth at bedtime.    [provider]  rosuvastatin (CRESTOR) 40 MG tablet Take 40 mg by mouth every evening.  08/20/16   [provider]  sitaGLIPtan-metformin (JANUMET) 50-1000 MG per tablet Take 1 tablet by mouth 2 (two) times daily with a meal.    [provider]  traMADol (ULTRAM) 50 MG tablet Take 50 mg by mouth every 6 (six) hours as needed for moderate pain.    [provider]    Family History Family History  Problem Relation Age of Onset  . Cancer Father   . Hyperlipidemia Daughter   . Diabetes Brother   . Heart disease Brother     Social History Social History   Tobacco Use  . Smoking  status: Former Smoker    Packs/day: 2.00    Types: Cigarettes    Start date: 01/15/1958    Last attempt to quit: 12/17/1993    Years since quitting: 24.6  . Smokeless tobacco: Never Used  Substance Use Topics  . Alcohol use: No  . Drug use: No     Allergies   Codeine; Penicillins; and Sulfa antibiotics   Review of Systems Review of Systems  Constitutional: Negative for chills and fever.  HENT: Negative for rhinorrhea and sore throat.   Eyes: Negative for  visual disturbance.  Respiratory: Positive for cough. Negative for shortness of breath.   Cardiovascular: Negative for chest pain.  Gastrointestinal: Positive for constipation. Negative for abdominal pain, diarrhea, nausea and vomiting.  Genitourinary: Negative for dysuria and frequency.  Musculoskeletal: Positive for arthralgias and myalgias.  Skin: Negative for rash.  Neurological: Positive for speech difficulty. Negative for seizures and weakness.  Psychiatric/Behavioral: Positive for confusion. Negative for agitation, hallucinations and self-injury.     Physical Exam Updated Vital Signs BP 110/63 (BP Location: Left Arm)   Pulse 90   Temp 98.8 F (37.1 C) (Oral)   Resp (!) 21   Ht 5' 3.5" (1.613 m)   Wt 72.6 kg   SpO2 (!) 88%   BMI 27.90 kg/m   Physical Exam  Constitutional: She is oriented to person, place, and time. She appears well-developed and well-nourished.  HENT:  Head: Normocephalic and atraumatic.  Right Ear: External ear normal.  Left Ear: External ear normal.  Nose: Nose normal.  Mouth/Throat: Oropharynx is clear and moist.  Eyes: Pupils are equal, round, and reactive to light. Conjunctivae and EOM are normal.  Neck: Neck supple.  Cardiovascular: Normal rate, regular rhythm, normal heart sounds and intact distal pulses.  Pulmonary/Chest: Effort normal. No stridor. She has no wheezes. She has no rales.  Abdominal: Soft. She exhibits no mass. There is no guarding.  Musculoskeletal: Normal range of  motion. She exhibits no deformity.  Neurological: She is alert and oriented to person, place, and time. GCS eye subscore is 4. GCS verbal subscore is 5. GCS motor subscore is 6.  Skin: Skin is warm and dry.  Psychiatric: She has a normal mood and affect.  Nursing note and vitals reviewed.    ED Treatments / Results  Labs (all labs ordered are listed, but only abnormal results are displayed) Labs Reviewed  CBC WITH DIFFERENTIAL/PLATELET - Abnormal; Notable for the following components:      Result Value   RBC 3.49 (*)    Hemoglobin 8.9 (*)    HCT 33.3 (*)    MCH 25.5 (*)    MCHC 26.7 (*)    RDW 16.1 (*)    All other components within normal limits  COMPREHENSIVE METABOLIC PANEL  BLOOD GAS, ARTERIAL  TROPONIN I  URINALYSIS, ROUTINE W REFLEX MICROSCOPIC    EKG EKG Interpretation  Date/Time:  Friday July 25 2018 13:29:25 EDT Ventricular Rate:  89 PR Interval:    QRS Duration: 100 QT Interval:  389 QTC Calculation: 474 R Axis:   81 Text Interpretation:  Sinus rhythm Atrial premature complexes Borderline right axis deviation Abnormal inferior Q waves Minimal ST depression, inferior leads Baseline wander in lead(s) II III aVL aVF similar pattern to prior 11/17 Confirmed by Aletta Edouard [phone]) on 07/25/2018 2:36:21 PM   Radiology Dg Chest 2 View  Result Date: 07/25/2018 CLINICAL DATA:  Cough and generalized body aches for 1-2 days. EXAM: CHEST - 2 VIEW COMPARISON:  12/01/2016 and older exams. FINDINGS: Status post CABG surgery. Cardiac silhouette is mildly enlarged. No mediastinal or hilar masses. No evidence of adenopathy. There are thickened bronchovascular markings bilaterally stable from the most recent prior study. No evidence of pneumonia and no overt pulmonary edema. No pleural effusion or pneumothorax. Right anterior chest VP shunt catheter is stable. Skeletal structures are demineralized. Vertebroplasty has been performed at the thoracolumbar junction. Mild un treated  compression fracture of a lower thoracic vertebra, new since the prior study. IMPRESSION: 1. No acute cardiopulmonary disease. 2. Mild cardiomegaly.  Stable changes from CABG surgery. Electronically Signed   By: Lajean Manes M.D.   On: 07/25/2018 15:11    Procedures .Critical Care Performed by: Hayden Rasmussen, MD Authorized by: Hayden Rasmussen, MD   Critical care provider statement:    Critical care time (minutes):  45   Critical care was necessary to treat or prevent imminent or life-threatening deterioration of the following conditions:  CNS failure or compromise and respiratory failure   Critical care was time spent personally by me on the following activities:  Discussions with consultants, evaluation of patient's response to treatment, examination of patient, ordering and performing treatments and interventions, ordering and review of laboratory studies, ordering and review of radiographic studies, pulse oximetry, re-evaluation of patient's condition, obtaining history from patient or surrogate, review of old charts and ventilator management   I assumed direction of critical care for this patient from another provider in my specialty: no     (including critical care time)  Medications Ordered in ED Medications - No data to display   Initial Impression / Assessment and Plan / ED Course  I have reviewed the triage vital signs and the nursing notes.  Pertinent labs & imaging results that were available during my care of the patient were reviewed by me and considered in my medical decision making (see chart for details).  Clinical Course as of Jul 27 822  [facility] Jul 25, 2018  1503 Received call from the lab that patient's ABG is abnormal with a low pH and high PCO2.  We will get her started on BiPAP with some steroids and breathing treatments.   [MB]  6226 Patient on BiPAP now and looking a little better.  Her antibiotics are infusing.   [MB]  1604 discussed with Dr. Shanon Brow from  the hospitalist service who will admit the patient.   [MB]    Clinical Course User Index [MB] Hayden Rasmussen, MD      Final Clinical Impressions(s) / ED Diagnoses   Final diagnoses:  Hypercapnic respiratory failure, chronic ([place])  Lower urinary tract infectious disease    ED Discharge Orders    None       Hayden Rasmussen, MD 07/27/18 [phone]

## 2018-07-25 NOTE — ED Notes (Signed)
From Rad 

## 2018-07-25 NOTE — Progress Notes (Signed)
1455 Critical ABG results called to A.Durene Romans, Therapist, sports. Currently unable to enter ABG results into [employer] because lab tech L.Schmidt is in patient file. Results will be entered when patient file available.

## 2018-07-25 NOTE — ED Notes (Signed)
Pt labs redrawn after incomplete lab  Stickers printed for pt

## 2018-07-25 NOTE — H&P (Signed)
History and Physical    [name] NLG:[id number] DOB: May 10, 1933 DOA: 07/25/2018  PCP: [name] Squibb, MD  Patient coming from: home  Chief Complaint:  confusion  HPI: [name] is a 84 y.o. female with medical history significant of chronic respiratory failure on 2 L of oxygen at home, COPD, renal artery disease, diabetes brought in by family members for several days of being very confused.  Patient's mental status is usually normal at her baseline.  The last several days she has been very confused which is happened before when her CO2 levels have been increased.  Her daughter finally brought her in evaluation.  She has not had any fevers.  No shortness of breath or wheezing.  Patient is found to have a PCO2 of over 90 and referred for admission for acute hypercapnic respiratory failure on chronic disease.  Patient is on BiPAP and seems to be improving on BiPAP.  She is still confused on the BiPAP and has unreliable history.   Review of Systems: Unattainable secondary to confusion  Past Medical History:  Diagnosis Date  . Coronary atherosclerosis of native coronary artery    Multivessel status post CABG  . Depression   . Glaucoma   . Hydrocephalus   . Hyperlipidemia   . Nephrolithiasis   . Spinal stenosis   . Type 2 diabetes mellitus ([place])     Past Surgical History:  Procedure Laterality Date  . APPENDECTOMY    . BACK SURGERY    . CORONARY ARTERY BYPASS GRAFT  11/24/1991   SVG to OM1, SVG to RCA  . FOOT SURGERY    . IR VERTEBROPLASTY CERV/THOR BX INC UNI/BIL INC/INJECT/IMAGING  04/19/2017  . KYPHOPLASTY    . VENTRICULOPERITONEAL SHUNT       reports that she quit smoking about 24 years ago. Her smoking use included cigarettes. She started smoking about 60 years ago. She smoked 2.00 packs per day. She has never used smokeless tobacco. She reports that she does not drink alcohol or use drugs.  Allergies  Allergen Reactions  . Codeine Other (See Comments)    Made  teeth loose  . Penicillins Rash    Has patient had a PCN reaction causing immediate rash, facial/tongue/throat swelling, SOB or lightheadedness with hypotension: No Has patient had a PCN reaction causing severe rash involving mucus membranes or skin necrosis: Yes Has patient had a PCN reaction that required hospitalization No Has patient had a PCN reaction occurring within the last 10 years: No If all of the above answers are "NO", then may proceed with Cephalosporin use.   . Sulfa Antibiotics Rash    Family History  Problem Relation Age of Onset  . Cancer Father   . Hyperlipidemia Daughter   . Diabetes Brother   . Heart disease Brother     Prior to Admission medications   Medication Sig Start Date End Date Taking? Authorizing Provider  albuterol (PROVENTIL) (2.5 MG/3ML) 0.083% nebulizer solution Take 3 mLs (2.5 mg total) by nebulization every 6 (six) hours as needed for wheezing or shortness of breath. ICD 10 code J44.9 11/04/16   [name] Dike, MD  aspirin EC 81 MG tablet Take 81 mg by mouth daily.    [provider]  BREO ELLIPTA 100-25 MCG/INH AEPB INHALE 1 PUFF INTO THE LUNGS EVERY DAY 02/16/18   [provider]  budesonide-formoterol (SYMBICORT) 160-4.5 MCG/ACT inhaler Inhale 2 puffs into the lungs 2 (two) times daily.    [provider]  Choline  Fenofibrate 135 MG capsule Take 135 mg by mouth daily.    [provider]  compounded topicals builder Apply 1 application topically 3 (three) times daily. (Lidocaine, Menthol, Diclofenac, Baclofen)    [provider]  dorzolamide-timolol (COSOPT) 22.3-6.8 MG/ML ophthalmic solution Place 1 drop into both eyes 2 (two) times daily.  10/30/14   [provider]  gabapentin (NEURONTIN) 100 MG capsule Take 100 mg by mouth 3 (three) times daily.  09/29/14   [provider]  latanoprost (XALATAN) 0.005 % ophthalmic solution Place 1 drop into both eyes at bedtime.    [provider]  lubiprostone (AMITIZA) 24 MCG capsule Take 24 mcg by mouth 2 (two) times daily with a meal.    [provider]  Milnacipran (SAVELLA) 50 MG TABS Take 50 mg by mouth 2 (two) times daily.    [provider]  Multiple Vitamins-Minerals (MULTIVITAMIN WITH MINERALS) tablet Take 1 tablet by mouth daily.     [provider]  nitroGLYCERIN (NITROSTAT) 0.4 MG SL tablet Place 1 tablet (0.4 mg total) under the tongue every 5 (five) minutes as needed for chest pain. 02/03/16   Satira Sark, MD  Omega 3 1200 MG CAPS Take 1 capsule by mouth 3 (three) times daily.    [provider]  pantoprazole (PROTONIX) 40 MG tablet Take 40 mg by mouth daily.  08/31/13   [provider]  pioglitazone (ACTOS) 15 MG tablet Take 15 mg by mouth daily.  10/03/14   [provider]  polyethylene glycol (MIRALAX / GLYCOLAX) packet Take 17 g by mouth at bedtime.    [provider]  rOPINIRole (REQUIP) 1 MG tablet Take 1 mg by mouth at bedtime.    [provider]  rosuvastatin (CRESTOR) 40 MG tablet Take 40 mg by mouth every evening.  08/20/16   [provider]  sitaGLIPtan-metformin (JANUMET) 50-1000 MG per tablet Take 1 tablet by mouth 2 (two) times daily with a meal.    [provider]  traMADol (ULTRAM) 50 MG tablet Take 50 mg by mouth every 6 (six) hours as needed for moderate pain.    [provider]    Physical Exam: Vitals:   07/25/18 1523 07/25/18 1531 07/25/18 1541 07/25/18 1600  BP: 101/71   114/85  Pulse: 92 89  86  Resp: (!) 23   (!) 23  Temp:      TempSrc:      SpO2: 93% 98% 96% 97%  Weight:      Height:         Constitutional: NAD, calm, comfortable pleasantly confused Vitals:   07/25/18 1523 07/25/18 1531 07/25/18 1541 07/25/18 1600  BP: 101/71   114/85  Pulse: 92 89  86  Resp: (!) 23   (!) 23  Temp:      TempSrc:      SpO2: 93% 98% 96% 97%  Weight:      Height:       Eyes: PERRL,  lids and conjunctivae normal ENMT: Mucous membranes are moist. Posterior pharynx clear of any exudate or lesions.Normal dentition.  Neck: normal, supple, no masses, no thyromegaly Respiratory: clear to auscultation bilaterally but very diminished, no wheezing, no crackles. Normal respiratory effort. No accessory muscle use.  Cardiovascular: Regular rate and rhythm, no murmurs / rubs / gallops. No extremity edema. 2+ pedal pulses. No carotid bruits.  Abdomen: no tenderness, no masses palpated. No hepatosplenomegaly. Bowel sounds positive.  Musculoskeletal: no clubbing / cyanosis. No joint  deformity upper and lower extremities. Good ROM, no contractures. Normal muscle tone.  Skin: no rashes, lesions, ulcers. No induration Neurologic: CN 2-12 grossly intact. Sensation intact, DTR normal. Strength 5/5 in all 4.  Psychiatric: Not normal judgment and insight. Alert and oriented x 1. Normal mood.    Labs on Admission: I have personally reviewed following labs and imaging studies  CBC: Recent Labs  Lab 07/25/18 1334 07/25/18 1441  WBC 7.4 7.8  NEUTROABS 5.3 5.5  HGB 8.9* 9.1*  HCT 33.3* 34.6*  MCV 95.4 95.8  PLT 390 664   Basic Metabolic Panel: Recent Labs  Lab 07/25/18 1410  NA 140  K 4.8  CL 94*  CO2 38*  GLUCOSE 121*  BUN 22  CREATININE 0.66  CALCIUM 9.9   GFR: Estimated Creatinine Clearance: 52.4 mL/min (by C-G formula based on SCr of 0.66 mg/dL). Liver Function Tests: Recent Labs  Lab 07/25/18 1410  AST 21  ALT 14  ALKPHOS 41  BILITOT 0.5  PROT 8.0  ALBUMIN 3.7   No results for input(s): LIPASE, AMYLASE in the last 168 hours. No results for input(s): AMMONIA in the last 168 hours. Coagulation Profile: No results for input(s): INR, PROTIME in the last 168 hours. Cardiac Enzymes: Recent Labs  Lab 07/25/18 1438  TROPONINI <0.03   BNP (last 3 results) No results for input(s): PROBNP in the last 8760 hours. HbA1C: No results for input(s): HGBA1C in the last 72  hours. CBG: No results for input(s): GLUCAP in the last 168 hours. Lipid Profile: No results for input(s): CHOL, HDL, LDLCALC, TRIG, CHOLHDL, LDLDIRECT in the last 72 hours. Thyroid Function Tests: No results for input(s): TSH, T4TOTAL, FREET4, T3FREE, THYROIDAB in the last 72 hours. Anemia Panel: No results for input(s): VITAMINB12, FOLATE, FERRITIN, TIBC, IRON, RETICCTPCT in the last 72 hours. Urine analysis:    Component Value Date/Time   COLORURINE YELLOW 07/25/2018 1429   APPEARANCEUR HAZY (A) 07/25/2018 1429   LABSPEC 1.018 07/25/2018 1429   PHURINE 6.0 07/25/2018 1429   GLUCOSEU NEGATIVE 07/25/2018 1429   HGBUR NEGATIVE 07/25/2018 1429   BILIRUBINUR NEGATIVE 07/25/2018 1429   KETONESUR NEGATIVE 07/25/2018 1429   PROTEINUR NEGATIVE 07/25/2018 1429   NITRITE NEGATIVE 07/25/2018 1429   LEUKOCYTESUR MODERATE (A) 07/25/2018 1429   Sepsis Labs: !!!!!!!!!!!!!!!!!!!!!!!!!!!!!!!!!!!!!!!!!!!! @LABRCNTIP (procalcitonin:4,lacticidven:4) )No results found for this or any previous visit (from the past 240 hour(s)).   Radiological Exams on Admission: Dg Chest 2 View  Result Date: 07/25/2018 CLINICAL DATA:  Cough and generalized body aches for 1-2 days. EXAM: CHEST - 2 VIEW COMPARISON:  12/01/2016 and older exams. FINDINGS: Status post CABG surgery. Cardiac silhouette is mildly enlarged. No mediastinal or hilar masses. No evidence of adenopathy. There are thickened bronchovascular markings bilaterally stable from the most recent prior study. No evidence of pneumonia and no overt pulmonary edema. No pleural effusion or pneumothorax. Right anterior chest VP shunt catheter is stable. Skeletal structures are demineralized. Vertebroplasty has been performed at the thoracolumbar junction. Mild un treated compression fracture of a lower thoracic vertebra, new since the prior study. IMPRESSION: 1. No acute cardiopulmonary disease. 2. Mild cardiomegaly.  Stable changes from CABG surgery. Electronically  Signed   By: Lajean Manes M.D.   On: 07/25/2018 15:11    EKG: Independently reviewed. nsr no acute changes Old chart reviewed Case discussed with dr butler in the ED   Assessment/Plan 84 year old female with acute hypercapnic respiratory failure secondary to COPD Principal Problem:   Acute and chronic respiratory failure with  hypercapnia (HCC)-continue BiPAP.  Wean as tolerates.  Repeat ABG in about 3 hours.  Treat COPD as well.   Active Problems:   COPD with exacerbation (HCC)-IV Solu-Medrol frequent nebs    Acute metabolic encephalopathy-secondary to hypercapnia.  No focal neurological deficits also in the setting of mild infection    Essential hypertension-clarify home meds    Diabetes mellitus with neuropathy (HCC)-sliding scale insulin holding oral home meds at this time    Hydrocephalus-history thereof.  If patient's respiratory and mental status does not improve with BiPAP consider further neuroimaging    Acute lower UTI-IV Rocephin and obtain urine culture      DVT prophylaxis: scds  Code Status:  full Family Communication: none Disposition Plan: days Consults called:  none Admission status:  admission   Uri Covey A MD [facility]  If 7PM-7AM, please contact night-coverage [email or web address] Password [email or web address]  07/25/2018, 4:26 PM

## 2018-07-25 NOTE — ED Notes (Signed)
Pt pulled out IV New IV by Judson Roch, RN

## 2018-07-25 NOTE — ED Notes (Signed)
Pt lives alone  Weak and is checked on by family  States [name] is president Knows she is at hospital and believes it is Thursday

## 2018-07-25 NOTE — ED Notes (Signed)
Critical value  ABG PH 7.237 PCO2 94.1 PO2  80.2 HCO3 33.2  Dr B apprised

## 2018-07-25 NOTE — Progress Notes (Signed)
Midlevel notiifed of bladder scan showing> 495 urine and of abd being distended. New orders received.

## 2018-07-26 DIAGNOSIS — G9341 Metabolic encephalopathy: Secondary | ICD-10-CM

## 2018-07-26 DIAGNOSIS — J9622 Acute and chronic respiratory failure with hypercapnia: Principal | ICD-10-CM

## 2018-07-26 DIAGNOSIS — N390 Urinary tract infection, site not specified: Secondary | ICD-10-CM

## 2018-07-26 DIAGNOSIS — J441 Chronic obstructive pulmonary disease with (acute) exacerbation: Secondary | ICD-10-CM

## 2018-07-26 LAB — BASIC METABOLIC PANEL
ANION GAP: 7 (ref 5–15)
BUN: 28 mg/dL — ABNORMAL HIGH (ref 8–23)
CHLORIDE: 94 mmol/L — AB (ref 98–111)
CO2: 38 mmol/L — AB (ref 22–32)
Calcium: 9.3 mg/dL (ref 8.9–10.3)
Creatinine, Ser: 0.64 mg/dL (ref 0.44–1.00)
GFR calc non Af Amer: >60 mL/min (ref >60)
GLUCOSE: 144 mg/dL — AB (ref 70–99)
POTASSIUM: 4.3 mmol/L (ref 3.5–5.1)
Sodium: 139 mmol/L (ref 135–145)

## 2018-07-26 LAB — CBC WITH DIFFERENTIAL/PLATELET
BASOS PCT: 0 %
Basophils Absolute: 0 10*3/uL (ref 0.0–0.1)
Eosinophils Absolute: 0 10*3/uL (ref 0.0–0.7)
Eosinophils Relative: 0 %
HCT: 31.5 % — ABNORMAL LOW (ref 36.0–46.0)
HEMOGLOBIN: 8.5 g/dL — AB (ref 12.0–15.0)
LYMPHS ABS: 0.7 10*3/uL (ref 0.7–4.0)
LYMPHS PCT: 11 %
MCH: 25.5 pg — AB (ref 26.0–34.0)
MCHC: 27 g/dL — AB (ref 30.0–36.0)
MCV: 94.6 fL (ref 78.0–100.0)
MONOS PCT: 9 %
Monocytes Absolute: 0.6 10*3/uL (ref 0.1–1.0)
NEUTROS ABS: 5.3 10*3/uL (ref 1.7–7.7)
NEUTROS PCT: 80 %
Platelets: 324 10*3/uL (ref 150–400)
RBC: 3.33 MIL/uL — ABNORMAL LOW (ref 3.87–5.11)
RDW: 15.9 % — ABNORMAL HIGH (ref 11.5–15.5)
WBC: 6.6 10*3/uL (ref 4.0–10.5)

## 2018-07-26 LAB — BLOOD GAS, ARTERIAL
Acid-Base Excess: 10.2 mmol/L — ABNORMAL HIGH (ref 0.0–2.0)
BICARBONATE: 33.6 mmol/L — AB (ref 20.0–28.0)
DRAWN BY: 22179
O2 Content: 3 L/min
O2 Saturation: 92.3 %
PATIENT TEMPERATURE: 37
pCO2 arterial: 49.1 mmHg — ABNORMAL HIGH (ref 32.0–48.0)
pH, Arterial: 7.461 — ABNORMAL HIGH (ref 7.350–7.450)
pO2, Arterial: 59.8 mmHg — ABNORMAL LOW (ref 83.0–108.0)

## 2018-07-26 LAB — GLUCOSE, CAPILLARY
GLUCOSE-CAPILLARY: 256 mg/dL — AB (ref 70–99)
GLUCOSE-CAPILLARY: 130 mg/dL — AB (ref 70–99)
GLUCOSE-CAPILLARY: 262 mg/dL — AB (ref 70–99)
Glucose-Capillary: 122 mg/dL — ABNORMAL HIGH (ref 70–99)
Glucose-Capillary: 126 mg/dL — ABNORMAL HIGH (ref 70–99)
Glucose-Capillary: 130 mg/dL — ABNORMAL HIGH (ref 70–99)
Glucose-Capillary: 212 mg/dL — ABNORMAL HIGH (ref 70–99)

## 2018-07-26 MED ORDER — MILNACIPRAN HCL 50 MG PO TABS
50.0000 mg | ORAL_TABLET | Freq: Two times a day (BID) | ORAL | Status: DC
  Administered 2018-07-26 – 2018-07-27 (×3): 50 mg via ORAL
  Filled 2018-07-26 (×5): qty 1

## 2018-07-26 MED ORDER — MAGNESIUM HYDROXIDE 400 MG/5ML PO SUSP
30.0000 mL | Freq: Every day | ORAL | Status: DC | PRN
  Administered 2018-07-26: 30 mL via ORAL
  Filled 2018-07-26: qty 30

## 2018-07-26 MED ORDER — TIMOLOL MALEATE 0.5 % OP SOLN
1.0000 [drp] | Freq: Two times a day (BID) | OPHTHALMIC | Status: DC
  Administered 2018-07-26 – 2018-07-27 (×3): 1 [drp] via OPHTHALMIC
  Filled 2018-07-26 (×2): qty 5

## 2018-07-26 MED ORDER — ROSUVASTATIN CALCIUM 20 MG PO TABS
40.0000 mg | ORAL_TABLET | Freq: Every evening | ORAL | Status: DC
  Administered 2018-07-26: 40 mg via ORAL
  Filled 2018-07-26: qty 2

## 2018-07-26 MED ORDER — BISACODYL 10 MG RE SUPP
10.0000 mg | Freq: Once | RECTAL | Status: AC
  Administered 2018-07-26: 10 mg via RECTAL
  Filled 2018-07-26: qty 1

## 2018-07-26 MED ORDER — ACETAMINOPHEN 325 MG PO TABS
650.0000 mg | ORAL_TABLET | Freq: Four times a day (QID) | ORAL | Status: DC | PRN
  Administered 2018-07-26: 650 mg via ORAL
  Filled 2018-07-26: qty 2

## 2018-07-26 MED ORDER — DORZOLAMIDE HCL-TIMOLOL MAL 2-0.5 % OP SOLN: 1.0000 [drp] | Freq: Two times a day (BID) | OPHTHALMIC | Status: DC

## 2018-07-26 MED ORDER — PANTOPRAZOLE SODIUM 40 MG PO TBEC
40.0000 mg | DELAYED_RELEASE_TABLET | Freq: Every morning | ORAL | Status: DC
  Administered 2018-07-26 – 2018-07-27 (×2): 40 mg via ORAL
  Filled 2018-07-26 (×2): qty 1

## 2018-07-26 MED ORDER — LATANOPROST 0.005 % OP SOLN
1.0000 [drp] | Freq: Every day | OPHTHALMIC | Status: DC
  Administered 2018-07-26: 1 [drp] via OPHTHALMIC
  Filled 2018-07-26: qty 2.5

## 2018-07-26 MED ORDER — FENOFIBRATE 160 MG PO TABS
160.0000 mg | ORAL_TABLET | Freq: Every day | ORAL | Status: DC
  Administered 2018-07-26 – 2018-07-27 (×2): 160 mg via ORAL
  Filled 2018-07-26 (×4): qty 1

## 2018-07-26 MED ORDER — DORZOLAMIDE HCL 2 % OP SOLN
1.0000 [drp] | Freq: Two times a day (BID) | OPHTHALMIC | Status: DC
  Administered 2018-07-26 – 2018-07-27 (×3): 1 [drp] via OPHTHALMIC
  Filled 2018-07-26 (×2): qty 10

## 2018-07-26 MED ORDER — LUBIPROSTONE 24 MCG PO CAPS
24.0000 ug | ORAL_CAPSULE | Freq: Two times a day (BID) | ORAL | Status: DC
  Administered 2018-07-26 – 2018-07-27 (×2): 24 ug via ORAL
  Filled 2018-07-26 (×2): qty 1

## 2018-07-26 MED ORDER — ASPIRIN EC 81 MG PO TBEC
81.0000 mg | DELAYED_RELEASE_TABLET | Freq: Every morning | ORAL | Status: DC
  Administered 2018-07-26 – 2018-07-27 (×2): 81 mg via ORAL
  Filled 2018-07-26 (×2): qty 1

## 2018-07-26 NOTE — Progress Notes (Signed)
Nutrition Brief Note  Nutrition screen performed as part of COPD Gold protocol.   Wt Readings from Last 15 Encounters:  07/25/18 77.2 kg  04/08/18 77.6 kg  04/19/17 73.5 kg  03/02/17 77.6 kg  02/24/17 77.6 kg  11/17/16 77.6 kg  10/31/16 77.3 kg  02/03/16 77.6 kg  04/28/15 73.5 kg  02/08/15 76 kg  11/08/14 76 kg  10/15/14 77.6 kg  03/24/14 72.6 kg  09/11/13 74 kg  10/19/12 69.4 kg   Body mass index is 29.68 kg/m. Patient meets criteria for overweight based on current BMI.   Per review of chart, patient's weight has been upper 160's to low 170's for many years now. Per ED notes, patients has not had any GI symptoms. Also noted she was given a meal in the ED and consumed most of it. Seems to be eating well despite confusion.   No nutrition interventions warranted at this time. If nutrition issues arise, please consult RD.   Burtis Junes RD, LDN, CNSC Clinical Nutrition Available Tues-Sat via Pager: [phone] 07/26/2018 9:25 AM

## 2018-07-26 NOTE — Progress Notes (Signed)
Patient awake talking to nurse. Off BiPAP on nasal cannula

## 2018-07-26 NOTE — Progress Notes (Signed)
Patient awake alert on cannula

## 2018-07-26 NOTE — Progress Notes (Signed)
Patient will  need BiPAP for sleep. She does decrease her saturation when she sleeps. Drops into 80's. Suspect this chronic with high PCO2.

## 2018-07-26 NOTE — Progress Notes (Signed)
PROGRESS NOTE    [name]  MEQ:[id number] DOB: 1933/08/21 DOA: 07/25/2018 PCP: Celene Squibb, MD     Brief Narrative:  84 year old woman admitted from home on 8/9 with history of COPD on chronic 2 L of oxygen, diabetes and morbid obesity who comes into the hospital with a 2 to 3-day history of progressive confusion and lethargy.  She was found to have acute respiratory failure with hypercapnia with a PCO2 of over 90 on ABG was started on BiPAP and referred for admission.   Assessment & Plan:   Principal Problem:   Acute and chronic respiratory failure with hypercapnia (HCC) Active Problems:   Essential hypertension   Diabetes mellitus with neuropathy (HCC)   COPD with exacerbation (HCC)   Hydrocephalus   Acute lower UTI   Acute metabolic encephalopathy   Acute metabolic encephalopathy  -Resolved, daughter at bedside confirms she is at baseline. -Due to hypercapnia, see below for further details.  Acute on chronic hypercapnic respiratory failure -Good response to BiPAP, is currently off may need to use again tonight, as such we will keep in ICU.  COPD with acute exacerbation -Continue IV steroids and nebs.  Diabetes -Fair management, continue to follow and adjust insulin as needed.  UTI -Continue Rocephin pending culture data.   DVT prophylaxis: SCDs Code Status: Full code Family Communication: Daughter at bedside updated on plan of care and all questions answered Disposition Plan: Keep in ICU today in case needs to go back on BiPAP, would anticipate discharge home over next 48 to 72 hours with clinical improvement  Consultants:   None  Procedures:   None  Antimicrobials:  Anti-infectives (From admission, onward)   Start     Dose/Rate Route Frequency Ordered Stop   07/26/18 1530  cefTRIAXone (ROCEPHIN) 1 g in sodium chloride 0.9 % 100 mL IVPB     1 g 200 mL/hr over 30 Minutes Intravenous Every 24 hours 07/25/18 1828     07/25/18 1645  cefTRIAXone  (ROCEPHIN) 1 g in sodium chloride 0.9 % 100 mL IVPB  Status:  Discontinued     1 g 200 mL/hr over 30 Minutes Intravenous Every 24 hours 07/25/18 1633 07/25/18 1828   07/25/18 1530  cefTRIAXone (ROCEPHIN) 1 g in sodium chloride 0.9 % 100 mL IVPB     1 g 200 mL/hr over 30 Minutes Intravenous  Once 07/25/18 1516 07/25/18 1712       Subjective: Lying in bed, states she feels much better, no shortness of breath, no chest pain.  Objective: Vitals:   07/26/18 0500 07/26/18 0700 07/26/18 0800 07/26/18 0818  BP: (!) 101/45 (!) 104/49 (!) 141/123   Pulse: 93 90 (!) 104   Resp: (!) 21 15 17    Temp:   97.7 F (36.5 C)   TempSrc:   Axillary   SpO2: 93% 90% 96% 96%  Weight:      Height:        Intake/Output Summary (Last 24 hours) at 07/26/2018 0928 Last data filed at 07/26/2018 0500 Gross per 24 hour  Intake 240 ml  Output 750 ml  Net -510 ml   Filed Weights   07/25/18 1311 07/25/18 1800  Weight: 72.6 kg 77.2 kg    Examination:  General exam: Alert, awake, oriented x 3, morbidly obese Respiratory system: Clear to auscultation. Respiratory effort normal. Cardiovascular system:RRR. No murmurs, rubs, gallops. Gastrointestinal system: Abdomen is nondistended, soft and nontender. No organomegaly or masses felt. Normal bowel sounds heard. Central nervous system: Alert  and oriented. No focal neurological deficits. Extremities: No C/C/E, +pedal pulses Skin: No rashes, lesions or ulcers Psychiatry: Judgement and insight appear normal. Mood & affect appropriate.     Data Reviewed: I have personally reviewed following labs and imaging studies  CBC: Recent Labs  Lab 07/25/18 1334 07/25/18 1441 07/26/18 0445  WBC 7.4 7.8 6.6  NEUTROABS 5.3 5.5 5.3  HGB 8.9* 9.1* 8.5*  HCT 33.3* 34.6* 31.5*  MCV 95.4 95.8 94.6  PLT 390 358 308   Basic Metabolic Panel: Recent Labs  Lab 07/25/18 1410 07/26/18 0445  NA 140 139  K 4.8 4.3  CL 94* 94*  CO2 38* 38*  GLUCOSE 121* 144*  BUN 22  28*  CREATININE 0.66 0.64  CALCIUM 9.9 9.3   GFR: Estimated Creatinine Clearance: 53.9 mL/min (by C-G formula based on SCr of 0.64 mg/dL). Liver Function Tests: Recent Labs  Lab 07/25/18 1410  AST 21  ALT 14  ALKPHOS 41  BILITOT 0.5  PROT 8.0  ALBUMIN 3.7   No results for input(s): LIPASE, AMYLASE in the last 168 hours. No results for input(s): AMMONIA in the last 168 hours. Coagulation Profile: No results for input(s): INR, PROTIME in the last 168 hours. Cardiac Enzymes: Recent Labs  Lab 07/25/18 1438  TROPONINI <0.03   BNP (last 3 results) No results for input(s): PROBNP in the last 8760 hours. HbA1C: No results for input(s): HGBA1C in the last 72 hours. CBG: Recent Labs  Lab 07/25/18 1826 07/25/18 1957 07/25/18 2353 07/26/18 0433 07/26/18 0740  GLUCAP 213* 243* 170* 130* 126*   Lipid Profile: No results for input(s): CHOL, HDL, LDLCALC, TRIG, CHOLHDL, LDLDIRECT in the last 72 hours. Thyroid Function Tests: No results for input(s): TSH, T4TOTAL, FREET4, T3FREE, THYROIDAB in the last 72 hours. Anemia Panel: No results for input(s): VITAMINB12, FOLATE, FERRITIN, TIBC, IRON, RETICCTPCT in the last 72 hours. Urine analysis:    Component Value Date/Time   COLORURINE YELLOW 07/25/2018 1429   APPEARANCEUR HAZY (A) 07/25/2018 1429   LABSPEC 1.018 07/25/2018 1429   PHURINE 6.0 07/25/2018 1429   GLUCOSEU NEGATIVE 07/25/2018 1429   HGBUR NEGATIVE 07/25/2018 1429   BILIRUBINUR NEGATIVE 07/25/2018 1429   KETONESUR NEGATIVE 07/25/2018 1429   PROTEINUR NEGATIVE 07/25/2018 1429   NITRITE NEGATIVE 07/25/2018 1429   LEUKOCYTESUR MODERATE (A) 07/25/2018 1429   Sepsis Labs: @LABRCNTIP (procalcitonin:4,lacticidven:4)  ) Recent Results (from the past 240 hour(s))  MRSA PCR Screening     Status: None   Collection Time: 07/25/18  5:51 PM  Result Value Ref Range Status   MRSA by PCR NEGATIVE NEGATIVE Final    Comment:        The GeneXpert MRSA Assay (FDA approved for  NASAL specimens only), is one component of a comprehensive MRSA colonization surveillance program. It is not intended to diagnose MRSA infection nor to guide or monitor treatment for MRSA infections. Performed at [facility], [address] [place], [place] [address]          Radiology Studies: Dg Chest 2 View  Result Date: 07/25/2018 CLINICAL DATA:  Cough and generalized body aches for 1-2 days. EXAM: CHEST - 2 VIEW COMPARISON:  12/01/2016 and older exams. FINDINGS: Status post CABG surgery. Cardiac silhouette is mildly enlarged. No mediastinal or hilar masses. No evidence of adenopathy. There are thickened bronchovascular markings bilaterally stable from the most recent prior study. No evidence of pneumonia and no overt pulmonary edema. No pleural effusion or pneumothorax. Right anterior chest VP shunt catheter is stable.  Skeletal structures are demineralized. Vertebroplasty has been performed at the thoracolumbar junction. Mild un treated compression fracture of a lower thoracic vertebra, new since the prior study. IMPRESSION: 1. No acute cardiopulmonary disease. 2. Mild cardiomegaly.  Stable changes from CABG surgery. Electronically Signed   By: Lajean Manes M.D.   On: 07/25/2018 15:11        Scheduled Meds: . aspirin EC  81 mg Oral q morning - 10a  . chlorhexidine  15 mL Mouth Rinse BID  . dorzolamide-timolol  1 drop Both Eyes BID  . fenofibrate  160 mg Oral Daily  . insulin aspart  0-9 Units Subcutaneous Q4H  . ipratropium-albuterol  3 mL Nebulization Q6H  . latanoprost  1 drop Both Eyes QHS  . lubiprostone  24 mcg Oral BID WC  . mouth rinse  15 mL Mouth Rinse q12n4p  . Milnacipran  50 mg Oral BID  . pantoprazole  40 mg Oral q morning - 10a  . rosuvastatin  40 mg Oral QPM  . sodium chloride flush  3 mL Intravenous Q12H   Continuous Infusions: . sodium chloride    . cefTRIAXone (ROCEPHIN)  IV       LOS: 1 day    Time spent: 35 minutes. Greater than 50% of this  time was spent in direct contact with the patient and with patient's daughter, coordinating care and discussing relevant ongoing clinical issues, including improvement in her acute respiratory failure, plan to monitor in the ICU in case she requires further BiPAP, need for good outpatient management of her COPD.     Lelon Frohlich, MD [facility] Pager [phone]  If 7PM-7AM, please contact night-coverage [email or web address] Password [facility] 07/26/2018, 9:28 AM

## 2018-07-27 LAB — CBC
HCT: 32.1 % — ABNORMAL LOW (ref 36.0–46.0)
Hemoglobin: 8.9 g/dL — ABNORMAL LOW (ref 12.0–15.0)
MCH: 25.3 pg — ABNORMAL LOW (ref 26.0–34.0)
MCHC: 27.7 g/dL — AB (ref 30.0–36.0)
MCV: 91.2 fL (ref 78.0–100.0)
PLATELETS: 372 10*3/uL (ref 150–400)
RBC: 3.52 MIL/uL — ABNORMAL LOW (ref 3.87–5.11)
RDW: 16.5 % — AB (ref 11.5–15.5)
WBC: 10.1 10*3/uL (ref 4.0–10.5)

## 2018-07-27 LAB — BASIC METABOLIC PANEL
ANION GAP: 11 (ref 5–15)
BUN: 24 mg/dL — AB (ref 8–23)
CALCIUM: 9.2 mg/dL (ref 8.9–10.3)
CO2: 30 mmol/L (ref 22–32)
CREATININE: 0.62 mg/dL (ref 0.44–1.00)
Chloride: 98 mmol/L (ref 98–111)
GFR calc Af Amer: >60 mL/min (ref >60)
Glucose, Bld: 138 mg/dL — ABNORMAL HIGH (ref 70–99)
Potassium: 3.9 mmol/L (ref 3.5–5.1)
Sodium: 139 mmol/L (ref 135–145)

## 2018-07-27 LAB — GLUCOSE, CAPILLARY
GLUCOSE-CAPILLARY: 132 mg/dL — AB (ref 70–99)
GLUCOSE-CAPILLARY: 127 mg/dL — AB (ref 70–99)
Glucose-Capillary: 288 mg/dL — ABNORMAL HIGH (ref 70–99)

## 2018-07-27 MED ORDER — PREDNISONE 10 MG PO TABS: 10.0000 mg | ORAL_TABLET | Freq: Every day | ORAL | 0 refills | Status: DC

## 2018-07-27 MED ORDER — TIOTROPIUM BROMIDE MONOHYDRATE 18 MCG IN CAPS: 18.0000 ug | ORAL_CAPSULE | Freq: Every day | RESPIRATORY_TRACT | 2 refills | Status: DC

## 2018-07-27 NOTE — Discharge Summary (Signed)
Physician Discharge Summary  [name] GUR:[id number] DOB: 12-Jan-1933 DOA: 07/25/2018  PCP: Celene Squibb, MD  Admit date: 07/25/2018 Discharge date: 07/27/2018  Time spent: 45 minutes  Recommendations for Outpatient Follow-up:  -Will be discharged home today. -ADvised to follow up with PCP in 2 weeks.   Discharge Diagnoses:  Principal Problem:   Acute and chronic respiratory failure with hypercapnia (HCC) Active Problems:   Essential hypertension   Diabetes mellitus with neuropathy (HCC)   COPD with exacerbation ([place])   Hydrocephalus   Acute lower UTI   Acute metabolic encephalopathy   Discharge Condition: Stable and improved  Filed Weights   07/25/18 1311 07/25/18 1800 07/27/18 0357  Weight: 72.6 kg 77.2 kg 74.9 kg    History of present illness:  As per Dr. Shanon Brow on 8/9: [name] is a 84 y.o. female with medical history significant of chronic respiratory failure on 2 L of oxygen at home, COPD, renal artery disease, diabetes brought in by family members for several days of being very confused.  Patient's mental status is usually normal at her baseline.  The last several days she has been very confused which is happened before when her CO2 levels have been increased.  Her daughter finally brought her in evaluation.  She has not had any fevers.  No shortness of breath or wheezing.  Patient is found to have a PCO2 of over 90 and referred for admission for acute hypercapnic respiratory failure on chronic disease.  Patient is on BiPAP and seems to be improving on BiPAP.  She is still confused on the BiPAP and has unreliable history.  Hospital Course:   Acute metabolic encephalopathy  -Resolved, daughter at bedside confirms she is at baseline. -Due to hypercapnia, see below for further details.  Acute on chronic hypercapnic respiratory failure -Due to COPD exacerbation/OHS/ and probably OSA. -Good response to BiPAP, pCO2 has decreased from 94 on admission to  49 on DC. -Would recommend sleep study on DC. -Will add spiriva to her breo-ellipta and PRN albuterol nebs. -Prednisone taper on DC.  Diabetes -Fair management, continue OP management.  UTI -Cx data not obtained on admission. -Has received 3 days of abx, no further prescription on DC. -Was asymptomatic.   Procedures:  None   Consultations:  None  Discharge Instructions  Discharge Instructions    Diet - low sodium heart healthy   Complete by:  As directed    Increase activity slowly   Complete by:  As directed      Allergies as of 07/27/2018      Reactions   Codeine Other (See Comments)   Made teeth loose   Penicillins Rash   Has patient had a PCN reaction causing immediate rash, facial/tongue/throat swelling, SOB or lightheadedness with hypotension: No Has patient had a PCN reaction causing severe rash involving mucus membranes or skin necrosis: Yes Has patient had a PCN reaction that required hospitalization No Has patient had a PCN reaction occurring within the last 10 years: No If all of the above answers are "NO", then may proceed with Cephalosporin use.   Sulfa Antibiotics Rash      Medication List    TAKE these medications   acetaminophen 500 MG tablet Commonly known as:  TYLENOL Take 500 mg by mouth every 6 (six) hours as needed for mild pain or moderate pain.   albuterol (2.5 MG/3ML) 0.083% nebulizer solution Commonly known as:  PROVENTIL Take 3 mLs (2.5 mg total) by nebulization every  6 (six) hours as needed for wheezing or shortness of breath. ICD 10 code J44.9   aspirin EC 81 MG tablet Take 81 mg by mouth every morning.   BREO ELLIPTA 100-25 MCG/INH Aepb Generic drug:  fluticasone furoate-vilanterol INHALE 1 PUFF INTO THE LUNGS EVERY DAY   CALCIUM 600+D 600-800 MG-UNIT Tabs Generic drug:  Calcium Carb-Cholecalciferol Take 1 tablet by mouth 2 (two) times daily.   Choline Fenofibrate 135 MG capsule Take 135 mg by mouth every morning.     COMPOUNDED TOPICALS BUILDER Apply 1 application topically 3 (three) times daily. (Lidocaine, Menthol, Diclofenac, Baclofen)   dorzolamide-timolol 22.3-6.8 MG/ML ophthalmic solution Commonly known as:  COSOPT Place 1 drop into both eyes 2 (two) times daily.   DULCOLAX 10 MG suppository Generic drug:  bisacodyl Place 10 mg rectally as needed for moderate constipation.   gabapentin 100 MG capsule Commonly known as:  NEURONTIN Take 100 mg by mouth 4 (four) times daily.   latanoprost 0.005 % ophthalmic solution Commonly known as:  XALATAN Place 1 drop into both eyes at bedtime.   lubiprostone 24 MCG capsule Commonly known as:  AMITIZA Take 24 mcg by mouth 2 (two) times daily with a meal.   multivitamin with minerals tablet Take 2 tablets by mouth daily. Complete Multivitamin 50+   nitroGLYCERIN 0.4 MG SL tablet Commonly known as:  NITROSTAT Place 1 tablet (0.4 mg total) under the tongue every 5 (five) minutes as needed for chest pain.   Omega 3 1200 MG Caps Take 1 capsule by mouth 2 (two) times daily.   OXYGEN Inhale 2 L into the lungs continuous.   pantoprazole 40 MG tablet Commonly known as:  PROTONIX Take 40 mg by mouth every morning.   pioglitazone 15 MG tablet Commonly known as:  ACTOS Take 15 mg by mouth daily.   polyethylene glycol packet Commonly known as:  MIRALAX / GLYCOLAX Take 17 g by mouth at bedtime.   predniSONE 10 MG tablet Commonly known as:  DELTASONE Take 1 tablet (10 mg total) by mouth daily with breakfast. Take 6 tablets today and then decrease by 1 tablet daily until none are left.   rOPINIRole 1 MG tablet Commonly known as:  REQUIP Take 1 mg by mouth at bedtime.   rosuvastatin 40 MG tablet Commonly known as:  CRESTOR Take 40 mg by mouth every evening.   SAVELLA 50 MG Tabs tablet Generic drug:  Milnacipran Take 50 mg by mouth 2 (two) times daily.   sitaGLIPtin-metformin 50-1000 MG tablet Commonly known as:  JANUMET Take 1 tablet by  mouth 2 (two) times daily with a meal.   tiotropium 18 MCG inhalation capsule Commonly known as:  SPIRIVA Place 1 capsule (18 mcg total) into inhaler and inhale daily.      Allergies  Allergen Reactions  . Codeine Other (See Comments)    Made teeth loose  . Penicillins Rash    Has patient had a PCN reaction causing immediate rash, facial/tongue/throat swelling, SOB or lightheadedness with hypotension: No Has patient had a PCN reaction causing severe rash involving mucus membranes or skin necrosis: Yes Has patient had a PCN reaction that required hospitalization No Has patient had a PCN reaction occurring within the last 10 years: No If all of the above answers are "NO", then may proceed with Cephalosporin use.   Ignacia Bayley Antibiotics Rash   Follow-up Information    Celene Squibb, MD. Schedule an appointment as soon as possible for a visit in 2 week(s).  Specialty:  Internal Medicine Contact information: [place] [address] [phone]            The results of significant diagnostics from this hospitalization (including imaging, microbiology, ancillary and laboratory) are listed below for reference.    Significant Diagnostic Studies: Dg Chest 2 View  Result Date: 07/25/2018 CLINICAL DATA:  Cough and generalized body aches for 1-2 days. EXAM: CHEST - 2 VIEW COMPARISON:  12/01/2016 and older exams. FINDINGS: Status post CABG surgery. Cardiac silhouette is mildly enlarged. No mediastinal or hilar masses. No evidence of adenopathy. There are thickened bronchovascular markings bilaterally stable from the most recent prior study. No evidence of pneumonia and no overt pulmonary edema. No pleural effusion or pneumothorax. Right anterior chest VP shunt catheter is stable. Skeletal structures are demineralized. Vertebroplasty has been performed at the thoracolumbar junction. Mild un treated compression fracture of a lower thoracic vertebra, new since the prior study.  IMPRESSION: 1. No acute cardiopulmonary disease. 2. Mild cardiomegaly.  Stable changes from CABG surgery. Electronically Signed   By: Lajean Manes M.D.   On: 07/25/2018 15:11    Microbiology: Recent Results (from the past 240 hour(s))  MRSA PCR Screening     Status: None   Collection Time: 07/25/18  5:51 PM  Result Value Ref Range Status   MRSA by PCR NEGATIVE NEGATIVE Final    Comment:        The GeneXpert MRSA Assay (FDA approved for NASAL specimens only), is one component of a comprehensive MRSA colonization surveillance program. It is not intended to diagnose MRSA infection nor to guide or monitor treatment for MRSA infections. Performed at [facility], [address] [place], [place] [address]      Labs: Basic Metabolic Panel: Recent Labs  Lab 07/25/18 1410 07/26/18 0445 07/27/18 0413  NA 140 139 139  K 4.8 4.3 3.9  CL 94* 94* 98  CO2 38* 38* 30  GLUCOSE 121* 144* 138*  BUN 22 28* 24*  CREATININE 0.66 0.64 0.62  CALCIUM 9.9 9.3 9.2   Liver Function Tests: Recent Labs  Lab 07/25/18 1410  AST 21  ALT 14  ALKPHOS 41  BILITOT 0.5  PROT 8.0  ALBUMIN 3.7   No results for input(s): LIPASE, AMYLASE in the last 168 hours. No results for input(s): AMMONIA in the last 168 hours. CBC: Recent Labs  Lab 07/25/18 1334 07/25/18 1441 07/26/18 0445 07/27/18 0413  WBC 7.4 7.8 6.6 10.1  NEUTROABS 5.3 5.5 5.3  --   HGB 8.9* 9.1* 8.5* 8.9*  HCT 33.3* 34.6* 31.5* 32.1*  MCV 95.4 95.8 94.6 91.2  PLT 390 358 324 372   Cardiac Enzymes: Recent Labs  Lab 07/25/18 1438  TROPONINI <0.03   BNP: BNP (last 3 results) No results for input(s): BNP in the last 8760 hours.  ProBNP (last 3 results) No results for input(s): PROBNP in the last 8760 hours.  CBG: Recent Labs  Lab 07/26/18 1939 07/26/18 2120 07/26/18 2344 07/27/18 0344 07/27/18 0751  GLUCAP 262* 212* 122* 132* 127*       Signed:  [facility] Pager:  [phone] 07/27/2018, 9:44 AM

## 2018-07-27 NOTE — Progress Notes (Signed)
Patient refused BIPAP will monitor .

## 2018-07-27 NOTE — Progress Notes (Signed)
Patient once again refused BiPAP. Patient has not slept at all tonight. She is on 3 lpm/[place] Saturation 97.

## 2018-07-27 NOTE — Progress Notes (Signed)
Patient discharge instruction given to family.  Take to car via wheelchair.

## 2018-08-04 DIAGNOSIS — J441 Chronic obstructive pulmonary disease with (acute) exacerbation: Secondary | ICD-10-CM | POA: Diagnosis not present

## 2018-08-04 DIAGNOSIS — J449 Chronic obstructive pulmonary disease, unspecified: Secondary | ICD-10-CM | POA: Diagnosis not present

## 2018-08-06 DIAGNOSIS — G2581 Restless legs syndrome: Secondary | ICD-10-CM | POA: Diagnosis not present

## 2018-08-06 DIAGNOSIS — I2510 Atherosclerotic heart disease of native coronary artery without angina pectoris: Secondary | ICD-10-CM | POA: Diagnosis not present

## 2018-08-06 DIAGNOSIS — J449 Chronic obstructive pulmonary disease, unspecified: Secondary | ICD-10-CM | POA: Diagnosis not present

## 2018-08-06 DIAGNOSIS — E119 Type 2 diabetes mellitus without complications: Secondary | ICD-10-CM | POA: Diagnosis not present

## 2018-08-06 DIAGNOSIS — J9611 Chronic respiratory failure with hypoxia: Secondary | ICD-10-CM | POA: Diagnosis not present

## 2018-08-08 ENCOUNTER — Encounter (HOSPITAL_COMMUNITY)
Admission: RE | Admit: 2018-08-08 | Discharge: 2018-08-08 | Disposition: A | Discharge status: Home / Self Care | Payer: Medicare Other | Source: Ambulatory Visit | Attending: Internal Medicine | Admitting: Internal Medicine

## 2018-08-08 ENCOUNTER — Encounter (HOSPITAL_COMMUNITY): Payer: Self-pay

## 2018-08-08 DIAGNOSIS — M810 Age-related osteoporosis without current pathological fracture: Secondary | ICD-10-CM | POA: Diagnosis not present

## 2018-08-08 MED ORDER — DENOSUMAB 60 MG/ML ~~LOC~~ SOSY
60.0000 mg | PREFILLED_SYRINGE | Freq: Once | SUBCUTANEOUS | Status: AC
  Administered 2018-08-08: 60 mg via SUBCUTANEOUS
  Filled 2018-08-08: qty 1

## 2018-08-08 NOTE — Discharge Instructions (Signed)
Denosumab injection °What is this medicine? °DENOSUMAB (den oh sue mab) slows bone breakdown. Prolia is used to treat osteoporosis in women after menopause and in men. Xgeva is used to treat a high calcium level due to cancer and to prevent bone fractures and other bone problems caused by multiple myeloma or cancer bone metastases. Xgeva is also used to treat giant cell tumor of the bone. °This medicine may be used for other purposes; ask your health care provider or pharmacist if you have questions. °COMMON BRAND NAME(S): Prolia, XGEVA °What should I tell my health care provider before I take this medicine? °They need to know if you have any of these conditions: °-dental disease °-having surgery or tooth extraction °-infection °-kidney disease °-low levels of calcium or Vitamin D in the blood °-malnutrition °-on hemodialysis °-skin conditions or sensitivity °-thyroid or parathyroid disease °-an unusual reaction to denosumab, other medicines, foods, dyes, or preservatives °-pregnant or trying to get pregnant °-breast-feeding °How should I use this medicine? °This medicine is for injection under the skin. It is given by a health care professional in a hospital or clinic setting. °If you are getting Prolia, a special MedGuide will be given to you by the pharmacist with each prescription and refill. Be sure to read this information carefully each time. °For Prolia, talk to your pediatrician regarding the use of this medicine in children. Special care may be needed. For Xgeva, talk to your pediatrician regarding the use of this medicine in children. While this drug may be prescribed for children as young as 13 years for selected conditions, precautions do apply. °Overdosage: If you think you have taken too much of this medicine contact a poison control center or emergency room at once. °NOTE: This medicine is only for you. Do not share this medicine with others. °What if I miss a dose? °It is important not to miss your  dose. Call your doctor or health care professional if you are unable to keep an appointment. °What may interact with this medicine? °Do not take this medicine with any of the following medications: °-other medicines containing denosumab °This medicine may also interact with the following medications: °-medicines that lower your chance of fighting infection °-steroid medicines like prednisone or cortisone °This list may not describe all possible interactions. Give your health care provider a list of all the medicines, herbs, non-prescription drugs, or dietary supplements you use. Also tell them if you smoke, drink alcohol, or use illegal drugs. Some items may interact with your medicine. °What should I watch for while using this medicine? °Visit your doctor or health care professional for regular checks on your progress. Your doctor or health care professional may order blood tests and other tests to see how you are doing. °Call your doctor or health care professional for advice if you get a fever, chills or sore throat, or other symptoms of a cold or flu. Do not treat yourself. This drug may decrease your body's ability to fight infection. Try to avoid being around people who are sick. °You should make sure you get enough calcium and vitamin D while you are taking this medicine, unless your doctor tells you not to. Discuss the foods you eat and the vitamins you take with your health care professional. °See your dentist regularly. Brush and floss your teeth as directed. Before you have any dental work done, tell your dentist you are receiving this medicine. °Do not become pregnant while taking this medicine or for 5 months after stopping   it. Talk with your doctor or health care professional about your birth control options while taking this medicine. Women should inform their doctor if they wish to become pregnant or think they might be pregnant. There is a potential for serious side effects to an unborn child. Talk  to your health care professional or pharmacist for more information. What side effects may I notice from receiving this medicine? Side effects that you should report to your doctor or health care professional as soon as possible: -allergic reactions like skin rash, itching or hives, swelling of the face, lips, or tongue -bone pain -breathing problems -dizziness -jaw pain, especially after dental work -redness, blistering, peeling of the skin -signs and symptoms of infection like fever or chills; cough; sore throat; pain or trouble passing urine -signs of low calcium like fast heartbeat, muscle cramps or muscle pain; pain, tingling, numbness in the hands or feet; seizures -unusual bleeding or bruising -unusually weak or tired Side effects that usually do not require medical attention (report to your doctor or health care professional if they continue or are bothersome): -constipation -diarrhea -headache -joint pain -loss of appetite -muscle pain -runny nose -tiredness -upset stomach This list may not describe all possible side effects. Call your doctor for medical advice about side effects. You may report side effects to FDA at 1-800-FDA-1088. Where should I keep my medicine? This medicine is only given in a clinic, doctor's office, or other health care setting and will not be stored at home. NOTE: This sheet is a summary. It may not cover all possible information. If you have questions about this medicine, talk to your doctor, pharmacist, or health care provider.  2018 Elsevier/Gold Standard (2016-12-25 19:17:21)

## 2018-08-11 DIAGNOSIS — Z7951 Long term (current) use of inhaled steroids: Secondary | ICD-10-CM | POA: Diagnosis not present

## 2018-08-11 DIAGNOSIS — D509 Iron deficiency anemia, unspecified: Secondary | ICD-10-CM | POA: Diagnosis not present

## 2018-08-11 DIAGNOSIS — H40009 Preglaucoma, unspecified, unspecified eye: Secondary | ICD-10-CM | POA: Diagnosis not present

## 2018-08-11 DIAGNOSIS — I5031 Acute diastolic (congestive) heart failure: Secondary | ICD-10-CM | POA: Diagnosis not present

## 2018-08-11 DIAGNOSIS — E119 Type 2 diabetes mellitus without complications: Secondary | ICD-10-CM | POA: Diagnosis not present

## 2018-08-11 DIAGNOSIS — Z7982 Long term (current) use of aspirin: Secondary | ICD-10-CM | POA: Diagnosis not present

## 2018-08-11 DIAGNOSIS — E785 Hyperlipidemia, unspecified: Secondary | ICD-10-CM | POA: Diagnosis not present

## 2018-08-11 DIAGNOSIS — Z7984 Long term (current) use of oral hypoglycemic drugs: Secondary | ICD-10-CM | POA: Diagnosis not present

## 2018-08-11 DIAGNOSIS — I110 Hypertensive heart disease with heart failure: Secondary | ICD-10-CM | POA: Diagnosis not present

## 2018-08-11 DIAGNOSIS — Z9981 Dependence on supplemental oxygen: Secondary | ICD-10-CM | POA: Diagnosis not present

## 2018-08-11 DIAGNOSIS — J449 Chronic obstructive pulmonary disease, unspecified: Secondary | ICD-10-CM | POA: Diagnosis not present

## 2018-08-12 DIAGNOSIS — Z7951 Long term (current) use of inhaled steroids: Secondary | ICD-10-CM | POA: Diagnosis not present

## 2018-08-12 DIAGNOSIS — E785 Hyperlipidemia, unspecified: Secondary | ICD-10-CM | POA: Diagnosis not present

## 2018-08-12 DIAGNOSIS — Z9981 Dependence on supplemental oxygen: Secondary | ICD-10-CM | POA: Diagnosis not present

## 2018-08-12 DIAGNOSIS — J449 Chronic obstructive pulmonary disease, unspecified: Secondary | ICD-10-CM | POA: Diagnosis not present

## 2018-08-12 DIAGNOSIS — H40009 Preglaucoma, unspecified, unspecified eye: Secondary | ICD-10-CM | POA: Diagnosis not present

## 2018-08-12 DIAGNOSIS — Z7982 Long term (current) use of aspirin: Secondary | ICD-10-CM | POA: Diagnosis not present

## 2018-08-12 DIAGNOSIS — I5031 Acute diastolic (congestive) heart failure: Secondary | ICD-10-CM | POA: Diagnosis not present

## 2018-08-12 DIAGNOSIS — I110 Hypertensive heart disease with heart failure: Secondary | ICD-10-CM | POA: Diagnosis not present

## 2018-08-12 DIAGNOSIS — D509 Iron deficiency anemia, unspecified: Secondary | ICD-10-CM | POA: Diagnosis not present

## 2018-08-12 DIAGNOSIS — Z7984 Long term (current) use of oral hypoglycemic drugs: Secondary | ICD-10-CM | POA: Diagnosis not present

## 2018-08-12 DIAGNOSIS — E119 Type 2 diabetes mellitus without complications: Secondary | ICD-10-CM | POA: Diagnosis not present

## 2018-08-13 DIAGNOSIS — E119 Type 2 diabetes mellitus without complications: Secondary | ICD-10-CM | POA: Diagnosis not present

## 2018-08-13 DIAGNOSIS — E785 Hyperlipidemia, unspecified: Secondary | ICD-10-CM | POA: Diagnosis not present

## 2018-08-13 DIAGNOSIS — Z7982 Long term (current) use of aspirin: Secondary | ICD-10-CM | POA: Diagnosis not present

## 2018-08-13 DIAGNOSIS — Z7984 Long term (current) use of oral hypoglycemic drugs: Secondary | ICD-10-CM | POA: Diagnosis not present

## 2018-08-13 DIAGNOSIS — H40009 Preglaucoma, unspecified, unspecified eye: Secondary | ICD-10-CM | POA: Diagnosis not present

## 2018-08-13 DIAGNOSIS — J449 Chronic obstructive pulmonary disease, unspecified: Secondary | ICD-10-CM | POA: Diagnosis not present

## 2018-08-13 DIAGNOSIS — Z7951 Long term (current) use of inhaled steroids: Secondary | ICD-10-CM | POA: Diagnosis not present

## 2018-08-13 DIAGNOSIS — Z9981 Dependence on supplemental oxygen: Secondary | ICD-10-CM | POA: Diagnosis not present

## 2018-08-13 DIAGNOSIS — I5031 Acute diastolic (congestive) heart failure: Secondary | ICD-10-CM | POA: Diagnosis not present

## 2018-08-13 DIAGNOSIS — I110 Hypertensive heart disease with heart failure: Secondary | ICD-10-CM | POA: Diagnosis not present

## 2018-08-13 DIAGNOSIS — D509 Iron deficiency anemia, unspecified: Secondary | ICD-10-CM | POA: Diagnosis not present

## 2018-08-16 DIAGNOSIS — M25562 Pain in left knee: Secondary | ICD-10-CM | POA: Diagnosis not present

## 2018-08-16 DIAGNOSIS — E119 Type 2 diabetes mellitus without complications: Secondary | ICD-10-CM | POA: Diagnosis not present

## 2018-08-16 DIAGNOSIS — S86912A Strain of unspecified muscle(s) and tendon(s) at lower leg level, left leg, initial encounter: Secondary | ICD-10-CM | POA: Diagnosis not present

## 2018-08-18 DIAGNOSIS — I5031 Acute diastolic (congestive) heart failure: Secondary | ICD-10-CM | POA: Diagnosis not present

## 2018-08-18 DIAGNOSIS — D509 Iron deficiency anemia, unspecified: Secondary | ICD-10-CM | POA: Diagnosis not present

## 2018-08-18 DIAGNOSIS — E785 Hyperlipidemia, unspecified: Secondary | ICD-10-CM | POA: Diagnosis not present

## 2018-08-18 DIAGNOSIS — I110 Hypertensive heart disease with heart failure: Secondary | ICD-10-CM | POA: Diagnosis not present

## 2018-08-18 DIAGNOSIS — Z7951 Long term (current) use of inhaled steroids: Secondary | ICD-10-CM | POA: Diagnosis not present

## 2018-08-18 DIAGNOSIS — J449 Chronic obstructive pulmonary disease, unspecified: Secondary | ICD-10-CM | POA: Diagnosis not present

## 2018-08-18 DIAGNOSIS — E119 Type 2 diabetes mellitus without complications: Secondary | ICD-10-CM | POA: Diagnosis not present

## 2018-08-18 DIAGNOSIS — Z7984 Long term (current) use of oral hypoglycemic drugs: Secondary | ICD-10-CM | POA: Diagnosis not present

## 2018-08-18 DIAGNOSIS — Z7982 Long term (current) use of aspirin: Secondary | ICD-10-CM | POA: Diagnosis not present

## 2018-08-18 DIAGNOSIS — H40009 Preglaucoma, unspecified, unspecified eye: Secondary | ICD-10-CM | POA: Diagnosis not present

## 2018-08-18 DIAGNOSIS — Z9981 Dependence on supplemental oxygen: Secondary | ICD-10-CM | POA: Diagnosis not present

## 2018-08-19 DIAGNOSIS — Z7951 Long term (current) use of inhaled steroids: Secondary | ICD-10-CM | POA: Diagnosis not present

## 2018-08-19 DIAGNOSIS — I5031 Acute diastolic (congestive) heart failure: Secondary | ICD-10-CM | POA: Diagnosis not present

## 2018-08-19 DIAGNOSIS — J449 Chronic obstructive pulmonary disease, unspecified: Secondary | ICD-10-CM | POA: Diagnosis not present

## 2018-08-19 DIAGNOSIS — H40009 Preglaucoma, unspecified, unspecified eye: Secondary | ICD-10-CM | POA: Diagnosis not present

## 2018-08-19 DIAGNOSIS — D509 Iron deficiency anemia, unspecified: Secondary | ICD-10-CM | POA: Diagnosis not present

## 2018-08-19 DIAGNOSIS — E119 Type 2 diabetes mellitus without complications: Secondary | ICD-10-CM | POA: Diagnosis not present

## 2018-08-19 DIAGNOSIS — Z7982 Long term (current) use of aspirin: Secondary | ICD-10-CM | POA: Diagnosis not present

## 2018-08-19 DIAGNOSIS — Z9981 Dependence on supplemental oxygen: Secondary | ICD-10-CM | POA: Diagnosis not present

## 2018-08-19 DIAGNOSIS — Z7984 Long term (current) use of oral hypoglycemic drugs: Secondary | ICD-10-CM | POA: Diagnosis not present

## 2018-08-19 DIAGNOSIS — I110 Hypertensive heart disease with heart failure: Secondary | ICD-10-CM | POA: Diagnosis not present

## 2018-08-19 DIAGNOSIS — E785 Hyperlipidemia, unspecified: Secondary | ICD-10-CM | POA: Diagnosis not present

## 2018-08-20 DIAGNOSIS — D509 Iron deficiency anemia, unspecified: Secondary | ICD-10-CM | POA: Diagnosis not present

## 2018-08-20 DIAGNOSIS — Z9981 Dependence on supplemental oxygen: Secondary | ICD-10-CM | POA: Diagnosis not present

## 2018-08-20 DIAGNOSIS — Z7984 Long term (current) use of oral hypoglycemic drugs: Secondary | ICD-10-CM | POA: Diagnosis not present

## 2018-08-20 DIAGNOSIS — E785 Hyperlipidemia, unspecified: Secondary | ICD-10-CM | POA: Diagnosis not present

## 2018-08-20 DIAGNOSIS — I110 Hypertensive heart disease with heart failure: Secondary | ICD-10-CM | POA: Diagnosis not present

## 2018-08-20 DIAGNOSIS — J449 Chronic obstructive pulmonary disease, unspecified: Secondary | ICD-10-CM | POA: Diagnosis not present

## 2018-08-20 DIAGNOSIS — Z7951 Long term (current) use of inhaled steroids: Secondary | ICD-10-CM | POA: Diagnosis not present

## 2018-08-20 DIAGNOSIS — I5031 Acute diastolic (congestive) heart failure: Secondary | ICD-10-CM | POA: Diagnosis not present

## 2018-08-20 DIAGNOSIS — E119 Type 2 diabetes mellitus without complications: Secondary | ICD-10-CM | POA: Diagnosis not present

## 2018-08-20 DIAGNOSIS — H40009 Preglaucoma, unspecified, unspecified eye: Secondary | ICD-10-CM | POA: Diagnosis not present

## 2018-08-20 DIAGNOSIS — Z7982 Long term (current) use of aspirin: Secondary | ICD-10-CM | POA: Diagnosis not present

## 2018-08-22 DIAGNOSIS — J449 Chronic obstructive pulmonary disease, unspecified: Secondary | ICD-10-CM | POA: Diagnosis not present

## 2018-08-22 DIAGNOSIS — E119 Type 2 diabetes mellitus without complications: Secondary | ICD-10-CM | POA: Diagnosis not present

## 2018-08-22 DIAGNOSIS — Z7982 Long term (current) use of aspirin: Secondary | ICD-10-CM | POA: Diagnosis not present

## 2018-08-22 DIAGNOSIS — E785 Hyperlipidemia, unspecified: Secondary | ICD-10-CM | POA: Diagnosis not present

## 2018-08-22 DIAGNOSIS — Z9981 Dependence on supplemental oxygen: Secondary | ICD-10-CM | POA: Diagnosis not present

## 2018-08-22 DIAGNOSIS — H40009 Preglaucoma, unspecified, unspecified eye: Secondary | ICD-10-CM | POA: Diagnosis not present

## 2018-08-22 DIAGNOSIS — D509 Iron deficiency anemia, unspecified: Secondary | ICD-10-CM | POA: Diagnosis not present

## 2018-08-22 DIAGNOSIS — Z7984 Long term (current) use of oral hypoglycemic drugs: Secondary | ICD-10-CM | POA: Diagnosis not present

## 2018-08-22 DIAGNOSIS — I5031 Acute diastolic (congestive) heart failure: Secondary | ICD-10-CM | POA: Diagnosis not present

## 2018-08-22 DIAGNOSIS — I110 Hypertensive heart disease with heart failure: Secondary | ICD-10-CM | POA: Diagnosis not present

## 2018-08-22 DIAGNOSIS — Z7951 Long term (current) use of inhaled steroids: Secondary | ICD-10-CM | POA: Diagnosis not present

## 2018-08-28 DIAGNOSIS — H40009 Preglaucoma, unspecified, unspecified eye: Secondary | ICD-10-CM | POA: Diagnosis not present

## 2018-08-28 DIAGNOSIS — J449 Chronic obstructive pulmonary disease, unspecified: Secondary | ICD-10-CM | POA: Diagnosis not present

## 2018-08-28 DIAGNOSIS — I5031 Acute diastolic (congestive) heart failure: Secondary | ICD-10-CM | POA: Diagnosis not present

## 2018-08-28 DIAGNOSIS — D509 Iron deficiency anemia, unspecified: Secondary | ICD-10-CM | POA: Diagnosis not present

## 2018-08-28 DIAGNOSIS — E785 Hyperlipidemia, unspecified: Secondary | ICD-10-CM | POA: Diagnosis not present

## 2018-08-28 DIAGNOSIS — E119 Type 2 diabetes mellitus without complications: Secondary | ICD-10-CM | POA: Diagnosis not present

## 2018-08-28 DIAGNOSIS — I110 Hypertensive heart disease with heart failure: Secondary | ICD-10-CM | POA: Diagnosis not present

## 2018-08-28 DIAGNOSIS — Z9981 Dependence on supplemental oxygen: Secondary | ICD-10-CM | POA: Diagnosis not present

## 2018-08-28 DIAGNOSIS — Z7984 Long term (current) use of oral hypoglycemic drugs: Secondary | ICD-10-CM | POA: Diagnosis not present

## 2018-08-28 DIAGNOSIS — Z7982 Long term (current) use of aspirin: Secondary | ICD-10-CM | POA: Diagnosis not present

## 2018-08-28 DIAGNOSIS — Z7951 Long term (current) use of inhaled steroids: Secondary | ICD-10-CM | POA: Diagnosis not present

## 2018-08-29 DIAGNOSIS — J449 Chronic obstructive pulmonary disease, unspecified: Secondary | ICD-10-CM | POA: Diagnosis not present

## 2018-09-01 DIAGNOSIS — Z7982 Long term (current) use of aspirin: Secondary | ICD-10-CM | POA: Diagnosis not present

## 2018-09-01 DIAGNOSIS — H40009 Preglaucoma, unspecified, unspecified eye: Secondary | ICD-10-CM | POA: Diagnosis not present

## 2018-09-01 DIAGNOSIS — I5031 Acute diastolic (congestive) heart failure: Secondary | ICD-10-CM | POA: Diagnosis not present

## 2018-09-01 DIAGNOSIS — E119 Type 2 diabetes mellitus without complications: Secondary | ICD-10-CM | POA: Diagnosis not present

## 2018-09-01 DIAGNOSIS — I110 Hypertensive heart disease with heart failure: Secondary | ICD-10-CM | POA: Diagnosis not present

## 2018-09-01 DIAGNOSIS — Z7951 Long term (current) use of inhaled steroids: Secondary | ICD-10-CM | POA: Diagnosis not present

## 2018-09-01 DIAGNOSIS — D509 Iron deficiency anemia, unspecified: Secondary | ICD-10-CM | POA: Diagnosis not present

## 2018-09-01 DIAGNOSIS — J449 Chronic obstructive pulmonary disease, unspecified: Secondary | ICD-10-CM | POA: Diagnosis not present

## 2018-09-01 DIAGNOSIS — Z7984 Long term (current) use of oral hypoglycemic drugs: Secondary | ICD-10-CM | POA: Diagnosis not present

## 2018-09-01 DIAGNOSIS — E785 Hyperlipidemia, unspecified: Secondary | ICD-10-CM | POA: Diagnosis not present

## 2018-09-01 DIAGNOSIS — Z9981 Dependence on supplemental oxygen: Secondary | ICD-10-CM | POA: Diagnosis not present

## 2018-09-04 DIAGNOSIS — D509 Iron deficiency anemia, unspecified: Secondary | ICD-10-CM | POA: Diagnosis not present

## 2018-09-04 DIAGNOSIS — E785 Hyperlipidemia, unspecified: Secondary | ICD-10-CM | POA: Diagnosis not present

## 2018-09-04 DIAGNOSIS — I110 Hypertensive heart disease with heart failure: Secondary | ICD-10-CM | POA: Diagnosis not present

## 2018-09-04 DIAGNOSIS — Z7984 Long term (current) use of oral hypoglycemic drugs: Secondary | ICD-10-CM | POA: Diagnosis not present

## 2018-09-04 DIAGNOSIS — J449 Chronic obstructive pulmonary disease, unspecified: Secondary | ICD-10-CM | POA: Diagnosis not present

## 2018-09-04 DIAGNOSIS — Z9981 Dependence on supplemental oxygen: Secondary | ICD-10-CM | POA: Diagnosis not present

## 2018-09-04 DIAGNOSIS — Z7951 Long term (current) use of inhaled steroids: Secondary | ICD-10-CM | POA: Diagnosis not present

## 2018-09-04 DIAGNOSIS — H40009 Preglaucoma, unspecified, unspecified eye: Secondary | ICD-10-CM | POA: Diagnosis not present

## 2018-09-04 DIAGNOSIS — Z7982 Long term (current) use of aspirin: Secondary | ICD-10-CM | POA: Diagnosis not present

## 2018-09-04 DIAGNOSIS — I5031 Acute diastolic (congestive) heart failure: Secondary | ICD-10-CM | POA: Diagnosis not present

## 2018-09-04 DIAGNOSIS — E119 Type 2 diabetes mellitus without complications: Secondary | ICD-10-CM | POA: Diagnosis not present

## 2018-09-05 DIAGNOSIS — Z7951 Long term (current) use of inhaled steroids: Secondary | ICD-10-CM | POA: Diagnosis not present

## 2018-09-05 DIAGNOSIS — H40009 Preglaucoma, unspecified, unspecified eye: Secondary | ICD-10-CM | POA: Diagnosis not present

## 2018-09-05 DIAGNOSIS — J449 Chronic obstructive pulmonary disease, unspecified: Secondary | ICD-10-CM | POA: Diagnosis not present

## 2018-09-05 DIAGNOSIS — I110 Hypertensive heart disease with heart failure: Secondary | ICD-10-CM | POA: Diagnosis not present

## 2018-09-05 DIAGNOSIS — E785 Hyperlipidemia, unspecified: Secondary | ICD-10-CM | POA: Diagnosis not present

## 2018-09-05 DIAGNOSIS — E119 Type 2 diabetes mellitus without complications: Secondary | ICD-10-CM | POA: Diagnosis not present

## 2018-09-05 DIAGNOSIS — D509 Iron deficiency anemia, unspecified: Secondary | ICD-10-CM | POA: Diagnosis not present

## 2018-09-05 DIAGNOSIS — I5031 Acute diastolic (congestive) heart failure: Secondary | ICD-10-CM | POA: Diagnosis not present

## 2018-09-05 DIAGNOSIS — Z7982 Long term (current) use of aspirin: Secondary | ICD-10-CM | POA: Diagnosis not present

## 2018-09-05 DIAGNOSIS — Z9981 Dependence on supplemental oxygen: Secondary | ICD-10-CM | POA: Diagnosis not present

## 2018-09-05 DIAGNOSIS — Z7984 Long term (current) use of oral hypoglycemic drugs: Secondary | ICD-10-CM | POA: Diagnosis not present

## 2018-09-10 DIAGNOSIS — Z7982 Long term (current) use of aspirin: Secondary | ICD-10-CM | POA: Diagnosis not present

## 2018-09-10 DIAGNOSIS — Z7951 Long term (current) use of inhaled steroids: Secondary | ICD-10-CM | POA: Diagnosis not present

## 2018-09-10 DIAGNOSIS — I110 Hypertensive heart disease with heart failure: Secondary | ICD-10-CM | POA: Diagnosis not present

## 2018-09-10 DIAGNOSIS — D509 Iron deficiency anemia, unspecified: Secondary | ICD-10-CM | POA: Diagnosis not present

## 2018-09-10 DIAGNOSIS — Z9981 Dependence on supplemental oxygen: Secondary | ICD-10-CM | POA: Diagnosis not present

## 2018-09-10 DIAGNOSIS — J449 Chronic obstructive pulmonary disease, unspecified: Secondary | ICD-10-CM | POA: Diagnosis not present

## 2018-09-10 DIAGNOSIS — I5031 Acute diastolic (congestive) heart failure: Secondary | ICD-10-CM | POA: Diagnosis not present

## 2018-09-10 DIAGNOSIS — E785 Hyperlipidemia, unspecified: Secondary | ICD-10-CM | POA: Diagnosis not present

## 2018-09-10 DIAGNOSIS — E119 Type 2 diabetes mellitus without complications: Secondary | ICD-10-CM | POA: Diagnosis not present

## 2018-09-10 DIAGNOSIS — Z7984 Long term (current) use of oral hypoglycemic drugs: Secondary | ICD-10-CM | POA: Diagnosis not present

## 2018-09-10 DIAGNOSIS — H40009 Preglaucoma, unspecified, unspecified eye: Secondary | ICD-10-CM | POA: Diagnosis not present

## 2018-09-11 DIAGNOSIS — I110 Hypertensive heart disease with heart failure: Secondary | ICD-10-CM | POA: Diagnosis not present

## 2018-09-11 DIAGNOSIS — Z7982 Long term (current) use of aspirin: Secondary | ICD-10-CM | POA: Diagnosis not present

## 2018-09-11 DIAGNOSIS — Z7951 Long term (current) use of inhaled steroids: Secondary | ICD-10-CM | POA: Diagnosis not present

## 2018-09-11 DIAGNOSIS — I5031 Acute diastolic (congestive) heart failure: Secondary | ICD-10-CM | POA: Diagnosis not present

## 2018-09-11 DIAGNOSIS — H40009 Preglaucoma, unspecified, unspecified eye: Secondary | ICD-10-CM | POA: Diagnosis not present

## 2018-09-11 DIAGNOSIS — J449 Chronic obstructive pulmonary disease, unspecified: Secondary | ICD-10-CM | POA: Diagnosis not present

## 2018-09-11 DIAGNOSIS — E119 Type 2 diabetes mellitus without complications: Secondary | ICD-10-CM | POA: Diagnosis not present

## 2018-09-11 DIAGNOSIS — E785 Hyperlipidemia, unspecified: Secondary | ICD-10-CM | POA: Diagnosis not present

## 2018-09-11 DIAGNOSIS — Z9981 Dependence on supplemental oxygen: Secondary | ICD-10-CM | POA: Diagnosis not present

## 2018-09-11 DIAGNOSIS — Z7984 Long term (current) use of oral hypoglycemic drugs: Secondary | ICD-10-CM | POA: Diagnosis not present

## 2018-09-11 DIAGNOSIS — D509 Iron deficiency anemia, unspecified: Secondary | ICD-10-CM | POA: Diagnosis not present

## 2018-09-12 DIAGNOSIS — M7052 Other bursitis of knee, left knee: Secondary | ICD-10-CM | POA: Diagnosis not present

## 2018-09-16 DIAGNOSIS — E785 Hyperlipidemia, unspecified: Secondary | ICD-10-CM | POA: Diagnosis not present

## 2018-09-16 DIAGNOSIS — J449 Chronic obstructive pulmonary disease, unspecified: Secondary | ICD-10-CM | POA: Diagnosis not present

## 2018-09-16 DIAGNOSIS — Z7982 Long term (current) use of aspirin: Secondary | ICD-10-CM | POA: Diagnosis not present

## 2018-09-16 DIAGNOSIS — I110 Hypertensive heart disease with heart failure: Secondary | ICD-10-CM | POA: Diagnosis not present

## 2018-09-16 DIAGNOSIS — H40009 Preglaucoma, unspecified, unspecified eye: Secondary | ICD-10-CM | POA: Diagnosis not present

## 2018-09-16 DIAGNOSIS — Z7951 Long term (current) use of inhaled steroids: Secondary | ICD-10-CM | POA: Diagnosis not present

## 2018-09-16 DIAGNOSIS — E119 Type 2 diabetes mellitus without complications: Secondary | ICD-10-CM | POA: Diagnosis not present

## 2018-09-16 DIAGNOSIS — I5031 Acute diastolic (congestive) heart failure: Secondary | ICD-10-CM | POA: Diagnosis not present

## 2018-09-16 DIAGNOSIS — Z7984 Long term (current) use of oral hypoglycemic drugs: Secondary | ICD-10-CM | POA: Diagnosis not present

## 2018-09-16 DIAGNOSIS — D509 Iron deficiency anemia, unspecified: Secondary | ICD-10-CM | POA: Diagnosis not present

## 2018-09-16 DIAGNOSIS — Z9981 Dependence on supplemental oxygen: Secondary | ICD-10-CM | POA: Diagnosis not present

## 2018-09-17 ENCOUNTER — Ambulatory Visit (HOSPITAL_COMMUNITY)
Admission: RE | Admit: 2018-09-17 | Discharge: 2018-09-17 | Disposition: A | Discharge status: Home / Self Care | Payer: Medicare Other | Source: Ambulatory Visit | Attending: Pulmonary Disease | Admitting: Pulmonary Disease

## 2018-09-17 ENCOUNTER — Other Ambulatory Visit (HOSPITAL_COMMUNITY): Payer: Self-pay | Admitting: Pulmonary Disease

## 2018-09-17 DIAGNOSIS — J449 Chronic obstructive pulmonary disease, unspecified: Secondary | ICD-10-CM | POA: Diagnosis not present

## 2018-09-17 DIAGNOSIS — I2510 Atherosclerotic heart disease of native coronary artery without angina pectoris: Secondary | ICD-10-CM | POA: Diagnosis not present

## 2018-09-17 DIAGNOSIS — M25562 Pain in left knee: Secondary | ICD-10-CM | POA: Diagnosis not present

## 2018-09-17 DIAGNOSIS — J9611 Chronic respiratory failure with hypoxia: Secondary | ICD-10-CM | POA: Diagnosis not present

## 2018-09-18 ENCOUNTER — Ambulatory Visit: Payer: Medicare Other | Admitting: Orthopaedic Surgery

## 2018-09-18 ENCOUNTER — Encounter: Payer: Self-pay | Admitting: Orthopaedic Surgery

## 2018-09-18 VITALS — BP 117/69 | HR 91 | Ht 63.5 in | Wt 160.0 lb

## 2018-09-18 DIAGNOSIS — Z9981 Dependence on supplemental oxygen: Secondary | ICD-10-CM | POA: Diagnosis not present

## 2018-09-18 DIAGNOSIS — M25562 Pain in left knee: Secondary | ICD-10-CM

## 2018-09-18 DIAGNOSIS — Z0000 Encounter for general adult medical examination without abnormal findings: Secondary | ICD-10-CM | POA: Diagnosis not present

## 2018-09-18 DIAGNOSIS — E1169 Type 2 diabetes mellitus with other specified complication: Secondary | ICD-10-CM | POA: Diagnosis not present

## 2018-09-18 DIAGNOSIS — J441 Chronic obstructive pulmonary disease with (acute) exacerbation: Secondary | ICD-10-CM | POA: Diagnosis not present

## 2018-09-18 DIAGNOSIS — M810 Age-related osteoporosis without current pathological fracture: Secondary | ICD-10-CM | POA: Diagnosis not present

## 2018-09-18 NOTE — Progress Notes (Signed)
Subjective:    Patient ID: [name], female    DOB: 1933/04/29, 84 y.o.   MRN: [id number]  HPI She fell and hurt her left knee about seven to ten days ago.  She fell in her kitchen.  She had to get EMS to help her get up. She has had continued pain of the left knee.  She saw Dr.Hawkins and had x-rays of the left knee yesterday.  She has swelling of the knee, no redness, no numbness and has crepitus.  X-rays showed: IMPRESSION: No acute abnormality.  No significant degenerative change.  She cannot bear weight on the knee.  She has a walker. She is on supplemental oxygen.  She has no other injury.  She has used ice and Voltaren gel with little help.   Review of Systems  Constitutional: Positive for activity change.  Respiratory: Positive for shortness of breath. Negative for cough.   Musculoskeletal: Positive for arthralgias, back pain, gait problem and joint swelling.  All other systems reviewed and are negative.  For Review of Systems, all other systems reviewed and are negative.  The following is a summary of the past history medically, past history surgically, known current medicines, social history and family history.  This information is gathered electronically by the computer from prior information and documentation.  I review this each visit and have found including this information at this point in the chart is beneficial and informative.   Past Medical History:  Diagnosis Date  . Coronary atherosclerosis of native coronary artery    Multivessel status post CABG  . Depression   . Glaucoma   . Hydrocephalus ([place])   . Hyperlipidemia   . Nephrolithiasis   . Spinal stenosis   . Type 2 diabetes mellitus ([place])     Past Surgical History:  Procedure Laterality Date  . APPENDECTOMY    . BACK SURGERY    . CORONARY ARTERY BYPASS GRAFT  11/24/1991   SVG to OM1, SVG to RCA  . FOOT SURGERY    . IR VERTEBROPLASTY CERV/THOR BX INC UNI/BIL INC/INJECT/IMAGING  04/19/2017  .  KYPHOPLASTY    . VENTRICULOPERITONEAL SHUNT      Current Outpatient Medications on File Prior to Visit  Medication Sig Dispense Refill  . acetaminophen (TYLENOL) 500 MG tablet Take 500 mg by mouth every 6 (six) hours as needed for mild pain or moderate pain.    Marland Kitchen albuterol (PROVENTIL) (2.5 MG/3ML) 0.083% nebulizer solution Take 3 mLs (2.5 mg total) by nebulization every 6 (six) hours as needed for wheezing or shortness of breath. ICD 10 code J44.9 75 mL 12  . aspirin EC 81 MG tablet Take 81 mg by mouth every morning.     . bisacodyl (DULCOLAX) 10 MG suppository Place 10 mg rectally as needed for moderate constipation.    Marland Kitchen BREO ELLIPTA 100-25 MCG/INH AEPB INHALE 1 PUFF INTO THE LUNGS EVERY DAY  12  . Calcium Carb-Cholecalciferol (CALCIUM 600+D) 600-800 MG-UNIT TABS Take 1 tablet by mouth 2 (two) times daily.    . Choline Fenofibrate 135 MG capsule Take 135 mg by mouth every morning.     . compounded topicals builder Apply 1 application topically 3 (three) times daily. (Lidocaine, Menthol, Diclofenac, Baclofen)    . denosumab (PROLIA) 60 MG/ML SOSY injection Inject 60 mg into the skin every 6 (six) months.    . dorzolamide-timolol (COSOPT) 22.3-6.8 MG/ML ophthalmic solution Place 1 drop into both eyes 2 (two) times daily.     Marland Kitchen  gabapentin (NEURONTIN) 100 MG capsule Take 100 mg by mouth 4 (four) times daily.     Marland Kitchen latanoprost (XALATAN) 0.005 % ophthalmic solution Place 1 drop into both eyes at bedtime.    Marland Kitchen lubiprostone (AMITIZA) 24 MCG capsule Take 24 mcg by mouth 2 (two) times daily with a meal.    . Milnacipran (SAVELLA) 50 MG TABS Take 50 mg by mouth 2 (two) times daily.    . Multiple Vitamins-Minerals (MULTIVITAMIN WITH MINERALS) tablet Take 2 tablets by mouth daily. Complete Multivitamin 50+    . nitroGLYCERIN (NITROSTAT) 0.4 MG SL tablet Place 1 tablet (0.4 mg total) under the tongue every 5 (five) minutes as needed for chest pain. 25 tablet 3  . Omega 3 1200 MG CAPS Take 1 capsule by  mouth 2 (two) times daily.     . OXYGEN Inhale 2 L into the lungs continuous.    . pantoprazole (PROTONIX) 40 MG tablet Take 40 mg by mouth every morning.     . pioglitazone (ACTOS) 15 MG tablet Take 15 mg by mouth daily.     . polyethylene glycol (MIRALAX / GLYCOLAX) packet Take 17 g by mouth at bedtime.    . predniSONE (DELTASONE) 10 MG tablet Take 1 tablet (10 mg total) by mouth daily with breakfast. Take 6 tablets today and then decrease by 1 tablet daily until none are left. 21 tablet 0  . rOPINIRole (REQUIP) 1 MG tablet Take 1 mg by mouth at bedtime.    . rosuvastatin (CRESTOR) 40 MG tablet Take 40 mg by mouth every evening.     . sitaGLIPtan-metformin (JANUMET) 50-1000 MG per tablet Take 1 tablet by mouth 2 (two) times daily with a meal.    . tiotropium (SPIRIVA HANDIHALER) 18 MCG inhalation capsule Place 1 capsule (18 mcg total) into inhaler and inhale daily. 30 capsule 2  . predniSONE (STERAPRED UNI-PAK 21 TAB) 10 MG (21) TBPK tablet TAKE 6 TABLETS ON DAY 1 AS DIRECTED ON PACKAGE AND DECREASE BY 1 TAB EACH DAY FOR A TOTAL OF 6 DAYS  0   No current facility-administered medications on file prior to visit.     Social History   Socioeconomic History  . Marital status: Widowed    Spouse name: Not on file  . Number of children: Not on file  . Years of education: Not on file  . Highest education level: Not on file  Occupational History  . Not on file  Social Needs  . Financial resource strain: Not on file  . Food insecurity:    Worry: Not on file    Inability: Not on file  . Transportation needs:    Medical: Not on file    Non-medical: Not on file  Tobacco Use  . Smoking status: Former Smoker    Packs/day: 2.00    Types: Cigarettes    Start date: 01/15/1958    Last attempt to quit: 12/17/1993    Years since quitting: 24.7  . Smokeless tobacco: Never Used  Substance and Sexual Activity  . Alcohol use: No  . Drug use: No  . Sexual activity: Never    Birth  control/protection: Post-menopausal  Lifestyle  . Physical activity:    Days per week: Not on file    Minutes per session: Not on file  . Stress: Not on file  Relationships  . Social connections:    Talks on phone: Not on file    Gets together: Not on file    Attends religious service:  Not on file    Active member of club or organization: Not on file    Attends meetings of clubs or organizations: Not on file    Relationship status: Not on file  . Intimate partner violence:    Fear of current or ex partner: Not on file    Emotionally abused: Not on file    Physically abused: Not on file    Forced sexual activity: Not on file  Other Topics Concern  . Not on file  Social History Narrative  . Not on file    Family History  Problem Relation Age of Onset  . Cancer Father   . Hyperlipidemia Daughter   . Diabetes Brother   . Heart disease Brother     BP 117/69   Pulse 91   Ht 5' 3.5" (1.613 m)   Wt 160 lb (72.6 kg)   BMI 27.90 kg/m   Body mass index is 27.9 kg/m.      Objective:   Physical Exam  Constitutional: She is oriented to person, place, and time. She appears well-developed and well-nourished.  HENT:  Head: Normocephalic and atraumatic.  Eyes: Pupils are equal, round, and reactive to light. Conjunctivae and EOM are normal.  Neck: Normal range of motion. Neck supple.  Cardiovascular: Normal rate, regular rhythm and intact distal pulses.  Pulmonary/Chest: Effort normal.  Abdominal: Soft.  Musculoskeletal:       Left knee: She exhibits decreased range of motion. Tenderness found. Medial joint line tenderness noted.       Legs: Neurological: She is alert and oriented to person, place, and time. She has normal reflexes. She displays normal reflexes. No cranial nerve deficit. She exhibits normal muscle tone. Coordination normal.  Skin: Skin is warm and dry.  Psychiatric: She has a normal mood and affect. Her behavior is normal. Judgment and thought content normal.     I have reviewed Dr. Kathaleen Grinder notes and the x-rays and x-ray report.      Assessment & Plan:   Encounter Diagnoses  Name Primary?  . Acute pain of left knee Yes  . Dependence on continuous supplemental oxygen    PROCEDURE NOTE:  The patient requests injections of the left knee , verbal consent was obtained.  The left knee was prepped appropriately after time out was performed.   Sterile technique was observed and injection of 1 cc of Depo-Medrol 40 mg with several cc's of plain xylocaine. Anesthesia was provided by ethyl chloride and a 20-gauge needle was used to inject the knee area. The injection was tolerated well.  A band aid dressing was applied.  The patient was advised to apply ice later today and tomorrow to the injection sight as needed.  I have recommended she continue the ice, walker and Voltaren Gel.  She may need MRI.  Return on October 15th.  Call if any problem.  Precautions discussed.   Electronically Signed Sanjuana Kava, MD 10/3/20192:08 PM

## 2018-09-30 ENCOUNTER — Encounter: Payer: Self-pay | Admitting: Orthopaedic Surgery

## 2018-09-30 ENCOUNTER — Ambulatory Visit: Payer: Medicare Other | Admitting: Orthopaedic Surgery

## 2018-09-30 VITALS — BP 112/63 | HR 102 | Ht 63.0 in

## 2018-09-30 DIAGNOSIS — G8929 Other chronic pain: Secondary | ICD-10-CM

## 2018-09-30 DIAGNOSIS — M25562 Pain in left knee: Secondary | ICD-10-CM | POA: Diagnosis not present

## 2018-09-30 NOTE — Progress Notes (Signed)
Patient [name], female DOB:1933-10-23, 84 y.o. VFI:[id number]  Chief Complaint  Patient presents with  . Knee Pain    left    HPI  [name] is a 83 y.o. female who has continued pain of the left knee.  The injection last time did not help that much. She is using horse liniment which helps more than anything. She has no new trauma. She has some swelling and medial pain of the left knee.   Body mass index is 28.34 kg/m.  ROS  Review of Systems  Constitutional: Positive for activity change.  Respiratory: Positive for shortness of breath. Negative for cough.   Musculoskeletal: Positive for arthralgias, back pain, gait problem and joint swelling.  All other systems reviewed and are negative.   All other systems reviewed and are negative.  The following is a summary of the past history medically, past history surgically, known current medicines, social history and family history.  This information is gathered electronically by the computer from prior information and documentation.  I review this each visit and have found including this information at this point in the chart is beneficial and informative.    Past Medical History:  Diagnosis Date  . Coronary atherosclerosis of native coronary artery    Multivessel status post CABG  . Depression   . Glaucoma   . Hydrocephalus ([place])   . Hyperlipidemia   . Nephrolithiasis   . Spinal stenosis   . Type 2 diabetes mellitus ([place])     Past Surgical History:  Procedure Laterality Date  . APPENDECTOMY    . BACK SURGERY    . CORONARY ARTERY BYPASS GRAFT  11/24/1991   SVG to OM1, SVG to RCA  . FOOT SURGERY    . IR VERTEBROPLASTY CERV/THOR BX INC UNI/BIL INC/INJECT/IMAGING  04/19/2017  . KYPHOPLASTY    . VENTRICULOPERITONEAL SHUNT      Family History  Problem Relation Age of Onset  . Cancer Father   . Hyperlipidemia Daughter   . Diabetes Brother   . Heart disease Brother     Social History Social History    Tobacco Use  . Smoking status: Former Smoker    Packs/day: 2.00    Types: Cigarettes    Start date: 01/15/1958    Last attempt to quit: 12/17/1993    Years since quitting: 24.8  . Smokeless tobacco: Never Used  Substance Use Topics  . Alcohol use: No  . Drug use: No    Allergies  Allergen Reactions  . Codeine Other (See Comments)    Made teeth loose  . Penicillins Rash    Has patient had a PCN reaction causing immediate rash, facial/tongue/throat swelling, SOB or lightheadedness with hypotension: No Has patient had a PCN reaction causing severe rash involving mucus membranes or skin necrosis: Yes Has patient had a PCN reaction that required hospitalization No Has patient had a PCN reaction occurring within the last 10 years: No If all of the above answers are "NO", then may proceed with Cephalosporin use.   . Sulfa Antibiotics Rash    Current Outpatient Medications  Medication Sig Dispense Refill  . acetaminophen (TYLENOL) 500 MG tablet Take 500 mg by mouth every 6 (six) hours as needed for mild pain or moderate pain.    Marland Kitchen albuterol (PROVENTIL) (2.5 MG/3ML) 0.083% nebulizer solution Take 3 mLs (2.5 mg total) by nebulization every 6 (six) hours as needed for wheezing or shortness of breath. ICD 10 code J44.9 75 mL 12  .  aspirin EC 81 MG tablet Take 81 mg by mouth every morning.     . bisacodyl (DULCOLAX) 10 MG suppository Place 10 mg rectally as needed for moderate constipation.    Marland Kitchen BREO ELLIPTA 100-25 MCG/INH AEPB INHALE 1 PUFF INTO THE LUNGS EVERY DAY  12  . Calcium Carb-Cholecalciferol (CALCIUM 600+D) 600-800 MG-UNIT TABS Take 1 tablet by mouth 2 (two) times daily.    . Choline Fenofibrate 135 MG capsule Take 135 mg by mouth every morning.     . compounded topicals builder Apply 1 application topically 3 (three) times daily. (Lidocaine, Menthol, Diclofenac, Baclofen)    . denosumab (PROLIA) 60 MG/ML SOSY injection Inject 60 mg into the skin every 6 (six) months.    .  dorzolamide-timolol (COSOPT) 22.3-6.8 MG/ML ophthalmic solution Place 1 drop into both eyes 2 (two) times daily.     Marland Kitchen gabapentin (NEURONTIN) 100 MG capsule Take 100 mg by mouth 4 (four) times daily.     Marland Kitchen latanoprost (XALATAN) 0.005 % ophthalmic solution Place 1 drop into both eyes at bedtime.    Marland Kitchen lubiprostone (AMITIZA) 24 MCG capsule Take 24 mcg by mouth 2 (two) times daily with a meal.    . Milnacipran (SAVELLA) 50 MG TABS Take 50 mg by mouth 2 (two) times daily.    . Multiple Vitamins-Minerals (MULTIVITAMIN WITH MINERALS) tablet Take 2 tablets by mouth daily. Complete Multivitamin 50+    . nitroGLYCERIN (NITROSTAT) 0.4 MG SL tablet Place 1 tablet (0.4 mg total) under the tongue every 5 (five) minutes as needed for chest pain. 25 tablet 3  . Omega 3 1200 MG CAPS Take 1 capsule by mouth 2 (two) times daily.     . OXYGEN Inhale 2 L into the lungs continuous.    . pantoprazole (PROTONIX) 40 MG tablet Take 40 mg by mouth every morning.     . pioglitazone (ACTOS) 15 MG tablet Take 15 mg by mouth daily.     . polyethylene glycol (MIRALAX / GLYCOLAX) packet Take 17 g by mouth at bedtime.    Marland Kitchen rOPINIRole (REQUIP) 1 MG tablet Take 1 mg by mouth at bedtime.    . rosuvastatin (CRESTOR) 40 MG tablet Take 40 mg by mouth every evening.     . sitaGLIPtan-metformin (JANUMET) 50-1000 MG per tablet Take 1 tablet by mouth 2 (two) times daily with a meal.    . tiotropium (SPIRIVA HANDIHALER) 18 MCG inhalation capsule Place 1 capsule (18 mcg total) into inhaler and inhale daily. 30 capsule 2   No current facility-administered medications for this visit.      Physical Exam  Blood pressure 112/63, pulse (!) 102, height 5\' 3"  (1.6 m).  Constitutional: overall normal hygiene, normal nutrition, well developed, normal grooming, normal body habitus. Assistive device:wheelchair  Musculoskeletal: gait and station Limp does not want to stand, muscle tone and strength are normal, no tremors or atrophy is present.   .  Neurological: coordination overall normal.  Deep tendon reflex/nerve stretch intact.  Sensation normal.  Cranial nerves II-XII intact.   Skin:   Normal overall no scars, lesions, ulcers or rashes. No psoriasis.  Psychiatric: Alert and oriented x 3.  Recent memory intact, remote memory unclear.  Normal mood and affect. Well groomed.  Good eye contact.  Cardiovascular: overall no swelling, no varicosities, no edema bilaterally, normal temperatures of the legs and arms, no clubbing, cyanosis and good capillary refill.  Lymphatic: palpation is normal.  Left knee with effusion ROM 0- to 105 with pain  and crepitus, NV intact.  All other systems reviewed and are negative   The patient has been educated about the nature of the problem(s) and counseled on treatment options.  The patient appeared to understand what I have discussed and is in agreement with it.  Encounter Diagnosis  Name Primary?  . Chronic pain of left knee Yes    PLAN Call if any problems.  Precautions discussed.  Continue current medications.   Return to clinic 1 month   Electronically Signed Sanjuana Kava, MD 10/15/20191:50 PM

## 2018-10-02 DIAGNOSIS — I10 Essential (primary) hypertension: Secondary | ICD-10-CM | POA: Diagnosis not present

## 2018-10-02 DIAGNOSIS — I110 Hypertensive heart disease with heart failure: Secondary | ICD-10-CM | POA: Diagnosis not present

## 2018-10-04 DIAGNOSIS — J449 Chronic obstructive pulmonary disease, unspecified: Secondary | ICD-10-CM | POA: Diagnosis not present

## 2018-10-21 DIAGNOSIS — E1169 Type 2 diabetes mellitus with other specified complication: Secondary | ICD-10-CM | POA: Diagnosis not present

## 2018-10-21 DIAGNOSIS — I10 Essential (primary) hypertension: Secondary | ICD-10-CM | POA: Diagnosis not present

## 2018-10-21 DIAGNOSIS — D509 Iron deficiency anemia, unspecified: Secondary | ICD-10-CM | POA: Diagnosis not present

## 2018-10-21 DIAGNOSIS — E119 Type 2 diabetes mellitus without complications: Secondary | ICD-10-CM | POA: Diagnosis not present

## 2018-10-21 DIAGNOSIS — E782 Mixed hyperlipidemia: Secondary | ICD-10-CM | POA: Diagnosis not present

## 2018-10-21 DIAGNOSIS — Z23 Encounter for immunization: Secondary | ICD-10-CM | POA: Diagnosis not present

## 2018-10-24 DIAGNOSIS — D509 Iron deficiency anemia, unspecified: Secondary | ICD-10-CM | POA: Diagnosis not present

## 2018-10-24 DIAGNOSIS — M25562 Pain in left knee: Secondary | ICD-10-CM | POA: Diagnosis not present

## 2018-10-24 DIAGNOSIS — E1140 Type 2 diabetes mellitus with diabetic neuropathy, unspecified: Secondary | ICD-10-CM | POA: Diagnosis not present

## 2018-10-24 DIAGNOSIS — M810 Age-related osteoporosis without current pathological fracture: Secondary | ICD-10-CM | POA: Diagnosis not present

## 2018-10-24 DIAGNOSIS — J441 Chronic obstructive pulmonary disease with (acute) exacerbation: Secondary | ICD-10-CM | POA: Diagnosis not present

## 2018-10-28 ENCOUNTER — Ambulatory Visit: Payer: Self-pay | Admitting: Orthopaedic Surgery

## 2018-10-30 ENCOUNTER — Encounter (HOSPITAL_COMMUNITY)
Admission: RE | Admit: 2018-10-30 | Discharge: 2018-10-30 | Disposition: A | Discharge status: Home / Self Care | Payer: Medicare Other | Source: Ambulatory Visit | Attending: Internal Medicine | Admitting: Internal Medicine

## 2018-10-30 ENCOUNTER — Encounter (HOSPITAL_COMMUNITY): Payer: Self-pay

## 2018-10-30 DIAGNOSIS — I2510 Atherosclerotic heart disease of native coronary artery without angina pectoris: Secondary | ICD-10-CM | POA: Diagnosis not present

## 2018-10-30 DIAGNOSIS — M810 Age-related osteoporosis without current pathological fracture: Secondary | ICD-10-CM | POA: Insufficient documentation

## 2018-10-30 DIAGNOSIS — D509 Iron deficiency anemia, unspecified: Secondary | ICD-10-CM | POA: Diagnosis not present

## 2018-10-30 DIAGNOSIS — J449 Chronic obstructive pulmonary disease, unspecified: Secondary | ICD-10-CM | POA: Diagnosis not present

## 2018-10-30 DIAGNOSIS — J9611 Chronic respiratory failure with hypoxia: Secondary | ICD-10-CM | POA: Diagnosis not present

## 2018-10-30 MED ORDER — DENOSUMAB 60 MG/ML ~~LOC~~ SOSY
60.0000 mg | PREFILLED_SYRINGE | Freq: Once | SUBCUTANEOUS | Status: AC
  Administered 2018-10-30: 60 mg via SUBCUTANEOUS
  Filled 2018-10-30: qty 1

## 2018-10-30 MED ORDER — SODIUM CHLORIDE 0.9 % IV SOLN
Freq: Once | INTRAVENOUS | Status: AC
  Administered 2018-10-30: 14:00:00 via INTRAVENOUS

## 2018-10-30 MED ORDER — SODIUM CHLORIDE 0.9 % IV SOLN
750.0000 mg | Freq: Once | INTRAVENOUS | Status: AC
  Administered 2018-10-30: 750 mg via INTRAVENOUS
  Filled 2018-10-30: qty 15

## 2018-10-30 NOTE — Discharge Instructions (Signed)
Ferric carboxymaltose injection What is this medicine? FERRIC CARBOXYMALTOSE (ferr-ik car-box-ee-mol-toes) is an iron complex. Iron is used to make healthy red blood cells, which carry oxygen and nutrients throughout the body. This medicine is used to treat anemia in people with chronic kidney disease or people who cannot take iron by mouth. This medicine may be used for other purposes; ask your health care provider or pharmacist if you have questions. COMMON BRAND NAME(S): Injectafer What should I tell my health care provider before I take this medicine? They need to know if you have any of these conditions: -anemia not caused by low iron levels -high levels of iron in the blood -liver disease -an unusual or allergic reaction to iron, other medicines, foods, dyes, or preservatives -pregnant or trying to get pregnant -breast-feeding How should I use this medicine? This medicine is for infusion into a vein. It is given by a health care professional in a hospital or clinic setting. Talk to your pediatrician regarding the use of this medicine in children. Special care may be needed. Overdosage: If you think you have taken too much of this medicine contact a poison control center or emergency room at once. NOTE: This medicine is only for you. Do not share this medicine with others. What if I miss a dose? It is important not to miss your dose. Call your doctor or health care professional if you are unable to keep an appointment. What may interact with this medicine? Do not take this medicine with any of the following medications: -deferoxamine -dimercaprol -other iron products This medicine may also interact with the following medications: -chloramphenicol -deferasirox This list may not describe all possible interactions. Give your health care provider a list of all the medicines, herbs, non-prescription drugs, or dietary supplements you use. Also tell them if you smoke, drink alcohol, or use  illegal drugs. Some items may interact with your medicine. What should I watch for while using this medicine? Visit your doctor or health care professional regularly. Tell your doctor if your symptoms do not start to get better or if they get worse. You may need blood work done while you are taking this medicine. You may need to follow a special diet. Talk to your doctor. Foods that contain iron include: whole grains/cereals, dried fruits, beans, or peas, leafy green vegetables, and organ meats (liver, kidney). What side effects may I notice from receiving this medicine? Side effects that you should report to your doctor or health care professional as soon as possible: -allergic reactions like skin rash, itching or hives, swelling of the face, lips, or tongue -breathing problems -changes in blood pressure -feeling faint or lightheaded, falls -flushing, sweating, or hot feelings Side effects that usually do not require medical attention (report to your doctor or health care professional if they continue or are bothersome): -changes in taste -constipation -dizziness -headache -nausea -pain, redness, or irritation at site where injected -vomiting This list may not describe all possible side effects. Call your doctor for medical advice about side effects. You may report side effects to FDA at 1-800-FDA-1088. Where should I keep my medicine? This drug is given in a hospital or clinic and will not be stored at home. NOTE: This sheet is a summary. It may not cover all possible information. If you have questions about this medicine, talk to your doctor, pharmacist, or health care provider.  2018 Elsevier/Gold Standard (2016-01-05 11:20:47)  

## 2018-11-04 DIAGNOSIS — J449 Chronic obstructive pulmonary disease, unspecified: Secondary | ICD-10-CM | POA: Diagnosis not present

## 2018-11-06 ENCOUNTER — Encounter (HOSPITAL_COMMUNITY)
Admission: RE | Admit: 2018-11-06 | Discharge: 2018-11-06 | Disposition: A | Discharge status: Home / Self Care | Payer: Medicare Other | Source: Ambulatory Visit | Attending: Internal Medicine | Admitting: Internal Medicine

## 2018-11-06 ENCOUNTER — Encounter (HOSPITAL_COMMUNITY): Payer: Self-pay

## 2018-11-06 DIAGNOSIS — M810 Age-related osteoporosis without current pathological fracture: Secondary | ICD-10-CM | POA: Diagnosis not present

## 2018-11-06 MED ORDER — SODIUM CHLORIDE 0.9 % IV SOLN
INTRAVENOUS | Status: DC
  Administered 2018-11-06: 13:00:00 via INTRAVENOUS

## 2018-11-06 MED ORDER — SODIUM CHLORIDE 0.9 % IV SOLN
750.0000 mg | Freq: Once | INTRAVENOUS | Status: AC
  Administered 2018-11-06: 750 mg via INTRAVENOUS
  Filled 2018-11-06: qty 15

## 2018-12-01 DIAGNOSIS — I10 Essential (primary) hypertension: Secondary | ICD-10-CM | POA: Diagnosis not present

## 2018-12-01 DIAGNOSIS — E1169 Type 2 diabetes mellitus with other specified complication: Secondary | ICD-10-CM | POA: Diagnosis not present

## 2018-12-01 DIAGNOSIS — E119 Type 2 diabetes mellitus without complications: Secondary | ICD-10-CM | POA: Diagnosis not present

## 2018-12-01 DIAGNOSIS — E782 Mixed hyperlipidemia: Secondary | ICD-10-CM | POA: Diagnosis not present

## 2018-12-01 DIAGNOSIS — I110 Hypertensive heart disease with heart failure: Secondary | ICD-10-CM | POA: Diagnosis not present

## 2018-12-04 DIAGNOSIS — J449 Chronic obstructive pulmonary disease, unspecified: Secondary | ICD-10-CM | POA: Diagnosis not present

## 2018-12-15 DIAGNOSIS — H401132 Primary open-angle glaucoma, bilateral, moderate stage: Secondary | ICD-10-CM | POA: Diagnosis not present

## 2018-12-15 DIAGNOSIS — H40053 Ocular hypertension, bilateral: Secondary | ICD-10-CM | POA: Diagnosis not present

## 2018-12-15 DIAGNOSIS — H04123 Dry eye syndrome of bilateral lacrimal glands: Secondary | ICD-10-CM | POA: Diagnosis not present

## 2019-01-04 DIAGNOSIS — J449 Chronic obstructive pulmonary disease, unspecified: Secondary | ICD-10-CM | POA: Diagnosis not present

## 2019-02-04 DIAGNOSIS — J449 Chronic obstructive pulmonary disease, unspecified: Secondary | ICD-10-CM | POA: Diagnosis not present

## 2019-02-09 DIAGNOSIS — E782 Mixed hyperlipidemia: Secondary | ICD-10-CM | POA: Diagnosis not present

## 2019-02-09 DIAGNOSIS — D509 Iron deficiency anemia, unspecified: Secondary | ICD-10-CM | POA: Diagnosis not present

## 2019-02-09 DIAGNOSIS — E118 Type 2 diabetes mellitus with unspecified complications: Secondary | ICD-10-CM | POA: Diagnosis not present

## 2019-02-09 DIAGNOSIS — E119 Type 2 diabetes mellitus without complications: Secondary | ICD-10-CM | POA: Diagnosis not present

## 2019-02-09 DIAGNOSIS — E1169 Type 2 diabetes mellitus with other specified complication: Secondary | ICD-10-CM | POA: Diagnosis not present

## 2019-02-10 ENCOUNTER — Encounter (HOSPITAL_COMMUNITY): Payer: Medicare Other

## 2019-02-10 ENCOUNTER — Other Ambulatory Visit (HOSPITAL_COMMUNITY): Payer: Medicare Other

## 2019-02-10 DIAGNOSIS — R0902 Hypoxemia: Secondary | ICD-10-CM | POA: Diagnosis not present

## 2019-02-10 DIAGNOSIS — M810 Age-related osteoporosis without current pathological fracture: Secondary | ICD-10-CM | POA: Diagnosis not present

## 2019-02-10 DIAGNOSIS — M25571 Pain in right ankle and joints of right foot: Secondary | ICD-10-CM | POA: Diagnosis not present

## 2019-02-10 DIAGNOSIS — J441 Chronic obstructive pulmonary disease with (acute) exacerbation: Secondary | ICD-10-CM | POA: Diagnosis not present

## 2019-02-10 DIAGNOSIS — D509 Iron deficiency anemia, unspecified: Secondary | ICD-10-CM | POA: Diagnosis not present

## 2019-02-23 DIAGNOSIS — J441 Chronic obstructive pulmonary disease with (acute) exacerbation: Secondary | ICD-10-CM | POA: Diagnosis not present

## 2019-02-23 DIAGNOSIS — K219 Gastro-esophageal reflux disease without esophagitis: Secondary | ICD-10-CM | POA: Diagnosis not present

## 2019-02-23 DIAGNOSIS — D509 Iron deficiency anemia, unspecified: Secondary | ICD-10-CM | POA: Diagnosis not present

## 2019-02-23 DIAGNOSIS — E1140 Type 2 diabetes mellitus with diabetic neuropathy, unspecified: Secondary | ICD-10-CM | POA: Diagnosis not present

## 2019-02-23 DIAGNOSIS — E782 Mixed hyperlipidemia: Secondary | ICD-10-CM | POA: Diagnosis not present

## 2019-03-05 DIAGNOSIS — J189 Pneumonia, unspecified organism: Secondary | ICD-10-CM | POA: Diagnosis not present

## 2019-03-05 DIAGNOSIS — J449 Chronic obstructive pulmonary disease, unspecified: Secondary | ICD-10-CM | POA: Diagnosis not present

## 2019-04-05 DIAGNOSIS — J189 Pneumonia, unspecified organism: Secondary | ICD-10-CM | POA: Diagnosis not present

## 2019-04-05 DIAGNOSIS — J449 Chronic obstructive pulmonary disease, unspecified: Secondary | ICD-10-CM | POA: Diagnosis not present

## 2019-04-16 DIAGNOSIS — J449 Chronic obstructive pulmonary disease, unspecified: Secondary | ICD-10-CM | POA: Diagnosis not present

## 2019-04-16 DIAGNOSIS — J9611 Chronic respiratory failure with hypoxia: Secondary | ICD-10-CM | POA: Diagnosis not present

## 2019-04-16 DIAGNOSIS — I2510 Atherosclerotic heart disease of native coronary artery without angina pectoris: Secondary | ICD-10-CM | POA: Diagnosis not present

## 2019-04-16 DIAGNOSIS — G2581 Restless legs syndrome: Secondary | ICD-10-CM | POA: Diagnosis not present

## 2019-04-20 DIAGNOSIS — J449 Chronic obstructive pulmonary disease, unspecified: Secondary | ICD-10-CM | POA: Diagnosis not present

## 2019-04-20 DIAGNOSIS — I110 Hypertensive heart disease with heart failure: Secondary | ICD-10-CM | POA: Diagnosis not present

## 2019-04-20 DIAGNOSIS — E1169 Type 2 diabetes mellitus with other specified complication: Secondary | ICD-10-CM | POA: Diagnosis not present

## 2019-04-20 DIAGNOSIS — E782 Mixed hyperlipidemia: Secondary | ICD-10-CM | POA: Diagnosis not present

## 2019-04-25 DIAGNOSIS — Z0000 Encounter for general adult medical examination without abnormal findings: Secondary | ICD-10-CM | POA: Diagnosis not present

## 2019-04-30 ENCOUNTER — Encounter (HOSPITAL_COMMUNITY): Payer: Medicare Other

## 2019-05-05 DIAGNOSIS — J449 Chronic obstructive pulmonary disease, unspecified: Secondary | ICD-10-CM | POA: Diagnosis not present

## 2019-05-05 DIAGNOSIS — J189 Pneumonia, unspecified organism: Secondary | ICD-10-CM | POA: Diagnosis not present

## 2019-06-05 DIAGNOSIS — E119 Type 2 diabetes mellitus without complications: Secondary | ICD-10-CM | POA: Diagnosis not present

## 2019-06-05 DIAGNOSIS — I10 Essential (primary) hypertension: Secondary | ICD-10-CM | POA: Diagnosis not present

## 2019-06-05 DIAGNOSIS — J449 Chronic obstructive pulmonary disease, unspecified: Secondary | ICD-10-CM | POA: Diagnosis not present

## 2019-06-05 DIAGNOSIS — M810 Age-related osteoporosis without current pathological fracture: Secondary | ICD-10-CM | POA: Diagnosis not present

## 2019-06-05 DIAGNOSIS — E118 Type 2 diabetes mellitus with unspecified complications: Secondary | ICD-10-CM | POA: Diagnosis not present

## 2019-06-05 DIAGNOSIS — J189 Pneumonia, unspecified organism: Secondary | ICD-10-CM | POA: Diagnosis not present

## 2019-06-05 DIAGNOSIS — E782 Mixed hyperlipidemia: Secondary | ICD-10-CM | POA: Diagnosis not present

## 2019-06-11 DIAGNOSIS — D509 Iron deficiency anemia, unspecified: Secondary | ICD-10-CM | POA: Diagnosis not present

## 2019-06-11 DIAGNOSIS — E1140 Type 2 diabetes mellitus with diabetic neuropathy, unspecified: Secondary | ICD-10-CM | POA: Diagnosis not present

## 2019-06-11 DIAGNOSIS — K219 Gastro-esophageal reflux disease without esophagitis: Secondary | ICD-10-CM | POA: Diagnosis not present

## 2019-06-11 DIAGNOSIS — J441 Chronic obstructive pulmonary disease with (acute) exacerbation: Secondary | ICD-10-CM | POA: Diagnosis not present

## 2019-06-11 DIAGNOSIS — R0902 Hypoxemia: Secondary | ICD-10-CM | POA: Diagnosis not present

## 2019-06-25 ENCOUNTER — Other Ambulatory Visit: Payer: Self-pay

## 2019-06-25 NOTE — Patient Outreach (Signed)
Requested letter mailed regarding care mgmt program.

## 2019-07-05 DIAGNOSIS — J189 Pneumonia, unspecified organism: Secondary | ICD-10-CM | POA: Diagnosis not present

## 2019-07-05 DIAGNOSIS — J449 Chronic obstructive pulmonary disease, unspecified: Secondary | ICD-10-CM | POA: Diagnosis not present

## 2019-07-16 DIAGNOSIS — G2581 Restless legs syndrome: Secondary | ICD-10-CM | POA: Diagnosis not present

## 2019-07-16 DIAGNOSIS — M545 Low back pain: Secondary | ICD-10-CM | POA: Diagnosis not present

## 2019-07-16 DIAGNOSIS — J449 Chronic obstructive pulmonary disease, unspecified: Secondary | ICD-10-CM | POA: Diagnosis not present

## 2019-07-16 DIAGNOSIS — J9611 Chronic respiratory failure with hypoxia: Secondary | ICD-10-CM | POA: Diagnosis not present

## 2019-07-31 DIAGNOSIS — E1169 Type 2 diabetes mellitus with other specified complication: Secondary | ICD-10-CM | POA: Diagnosis not present

## 2019-07-31 DIAGNOSIS — I110 Hypertensive heart disease with heart failure: Secondary | ICD-10-CM | POA: Diagnosis not present

## 2019-07-31 DIAGNOSIS — J449 Chronic obstructive pulmonary disease, unspecified: Secondary | ICD-10-CM | POA: Diagnosis not present

## 2019-07-31 DIAGNOSIS — E782 Mixed hyperlipidemia: Secondary | ICD-10-CM | POA: Diagnosis not present

## 2019-08-05 DIAGNOSIS — J189 Pneumonia, unspecified organism: Secondary | ICD-10-CM | POA: Diagnosis not present

## 2019-08-05 DIAGNOSIS — J449 Chronic obstructive pulmonary disease, unspecified: Secondary | ICD-10-CM | POA: Diagnosis not present

## 2019-08-12 ENCOUNTER — Other Ambulatory Visit: Payer: Self-pay

## 2019-08-12 ENCOUNTER — Encounter (HOSPITAL_COMMUNITY): Payer: Self-pay

## 2019-08-12 ENCOUNTER — Encounter (HOSPITAL_COMMUNITY)
Admission: RE | Admit: 2019-08-12 | Discharge: 2019-08-12 | Disposition: A | Discharge status: Home / Self Care | Payer: Medicare Other | Source: Ambulatory Visit | Attending: Internal Medicine | Admitting: Internal Medicine

## 2019-08-12 DIAGNOSIS — M810 Age-related osteoporosis without current pathological fracture: Secondary | ICD-10-CM | POA: Diagnosis not present

## 2019-08-12 MED ORDER — DENOSUMAB 60 MG/ML ~~LOC~~ SOSY
PREFILLED_SYRINGE | SUBCUTANEOUS | Status: AC
  Filled 2019-08-12: qty 1

## 2019-08-12 MED ORDER — DENOSUMAB 60 MG/ML ~~LOC~~ SOSY
60.0000 mg | PREFILLED_SYRINGE | Freq: Once | SUBCUTANEOUS | Status: AC
  Administered 2019-08-12: 60 mg via SUBCUTANEOUS

## 2019-08-13 ENCOUNTER — Ambulatory Visit: Payer: Medicare Other | Admitting: Student

## 2019-08-18 DIAGNOSIS — J449 Chronic obstructive pulmonary disease, unspecified: Secondary | ICD-10-CM | POA: Diagnosis not present

## 2019-08-18 DIAGNOSIS — E782 Mixed hyperlipidemia: Secondary | ICD-10-CM | POA: Diagnosis not present

## 2019-08-18 DIAGNOSIS — I110 Hypertensive heart disease with heart failure: Secondary | ICD-10-CM | POA: Diagnosis not present

## 2019-08-18 DIAGNOSIS — E1169 Type 2 diabetes mellitus with other specified complication: Secondary | ICD-10-CM | POA: Diagnosis not present

## 2019-09-05 DIAGNOSIS — J189 Pneumonia, unspecified organism: Secondary | ICD-10-CM | POA: Diagnosis not present

## 2019-09-05 DIAGNOSIS — J449 Chronic obstructive pulmonary disease, unspecified: Secondary | ICD-10-CM | POA: Diagnosis not present

## 2019-09-25 DIAGNOSIS — E1140 Type 2 diabetes mellitus with diabetic neuropathy, unspecified: Secondary | ICD-10-CM | POA: Diagnosis not present

## 2019-09-25 DIAGNOSIS — I10 Essential (primary) hypertension: Secondary | ICD-10-CM | POA: Diagnosis not present

## 2019-09-25 DIAGNOSIS — E559 Vitamin D deficiency, unspecified: Secondary | ICD-10-CM | POA: Diagnosis not present

## 2019-09-25 DIAGNOSIS — E118 Type 2 diabetes mellitus with unspecified complications: Secondary | ICD-10-CM | POA: Diagnosis not present

## 2019-09-25 DIAGNOSIS — D509 Iron deficiency anemia, unspecified: Secondary | ICD-10-CM | POA: Diagnosis not present

## 2019-09-30 DIAGNOSIS — D509 Iron deficiency anemia, unspecified: Secondary | ICD-10-CM | POA: Diagnosis not present

## 2019-09-30 DIAGNOSIS — K219 Gastro-esophageal reflux disease without esophagitis: Secondary | ICD-10-CM | POA: Diagnosis not present

## 2019-09-30 DIAGNOSIS — J441 Chronic obstructive pulmonary disease with (acute) exacerbation: Secondary | ICD-10-CM | POA: Diagnosis not present

## 2019-09-30 DIAGNOSIS — R0902 Hypoxemia: Secondary | ICD-10-CM | POA: Diagnosis not present

## 2019-09-30 DIAGNOSIS — E1140 Type 2 diabetes mellitus with diabetic neuropathy, unspecified: Secondary | ICD-10-CM | POA: Diagnosis not present

## 2019-10-05 DIAGNOSIS — J189 Pneumonia, unspecified organism: Secondary | ICD-10-CM | POA: Diagnosis not present

## 2019-10-05 DIAGNOSIS — J449 Chronic obstructive pulmonary disease, unspecified: Secondary | ICD-10-CM | POA: Diagnosis not present

## 2019-10-06 DIAGNOSIS — J441 Chronic obstructive pulmonary disease with (acute) exacerbation: Secondary | ICD-10-CM | POA: Diagnosis not present

## 2019-10-06 DIAGNOSIS — Z961 Presence of intraocular lens: Secondary | ICD-10-CM | POA: Diagnosis not present

## 2019-10-06 DIAGNOSIS — H35373 Puckering of macula, bilateral: Secondary | ICD-10-CM | POA: Diagnosis not present

## 2019-10-06 DIAGNOSIS — E782 Mixed hyperlipidemia: Secondary | ICD-10-CM | POA: Diagnosis not present

## 2019-10-06 DIAGNOSIS — K219 Gastro-esophageal reflux disease without esophagitis: Secondary | ICD-10-CM | POA: Diagnosis not present

## 2019-10-06 DIAGNOSIS — E119 Type 2 diabetes mellitus without complications: Secondary | ICD-10-CM | POA: Diagnosis not present

## 2019-10-06 DIAGNOSIS — D509 Iron deficiency anemia, unspecified: Secondary | ICD-10-CM | POA: Diagnosis not present

## 2019-10-06 DIAGNOSIS — E1140 Type 2 diabetes mellitus with diabetic neuropathy, unspecified: Secondary | ICD-10-CM | POA: Diagnosis not present

## 2019-10-13 DIAGNOSIS — H401133 Primary open-angle glaucoma, bilateral, severe stage: Secondary | ICD-10-CM | POA: Diagnosis not present

## 2019-10-20 DIAGNOSIS — J441 Chronic obstructive pulmonary disease with (acute) exacerbation: Secondary | ICD-10-CM | POA: Diagnosis not present

## 2019-10-20 DIAGNOSIS — E782 Mixed hyperlipidemia: Secondary | ICD-10-CM | POA: Diagnosis not present

## 2019-10-20 DIAGNOSIS — K219 Gastro-esophageal reflux disease without esophagitis: Secondary | ICD-10-CM | POA: Diagnosis not present

## 2019-10-20 DIAGNOSIS — E1140 Type 2 diabetes mellitus with diabetic neuropathy, unspecified: Secondary | ICD-10-CM | POA: Diagnosis not present

## 2019-11-03 ENCOUNTER — Ambulatory Visit (HOSPITAL_COMMUNITY): Payer: Medicare Other

## 2019-11-05 DIAGNOSIS — J449 Chronic obstructive pulmonary disease, unspecified: Secondary | ICD-10-CM | POA: Diagnosis not present

## 2019-11-05 DIAGNOSIS — J189 Pneumonia, unspecified organism: Secondary | ICD-10-CM | POA: Diagnosis not present

## 2019-11-09 DIAGNOSIS — J449 Chronic obstructive pulmonary disease, unspecified: Secondary | ICD-10-CM | POA: Diagnosis not present

## 2019-11-09 DIAGNOSIS — I2510 Atherosclerotic heart disease of native coronary artery without angina pectoris: Secondary | ICD-10-CM | POA: Diagnosis not present

## 2019-11-09 DIAGNOSIS — J9611 Chronic respiratory failure with hypoxia: Secondary | ICD-10-CM | POA: Diagnosis not present

## 2019-11-11 DIAGNOSIS — H401133 Primary open-angle glaucoma, bilateral, severe stage: Secondary | ICD-10-CM | POA: Diagnosis not present

## 2019-12-02 DIAGNOSIS — E782 Mixed hyperlipidemia: Secondary | ICD-10-CM | POA: Diagnosis not present

## 2019-12-02 DIAGNOSIS — K219 Gastro-esophageal reflux disease without esophagitis: Secondary | ICD-10-CM | POA: Diagnosis not present

## 2019-12-02 DIAGNOSIS — J441 Chronic obstructive pulmonary disease with (acute) exacerbation: Secondary | ICD-10-CM | POA: Diagnosis not present

## 2019-12-02 DIAGNOSIS — E1140 Type 2 diabetes mellitus with diabetic neuropathy, unspecified: Secondary | ICD-10-CM | POA: Diagnosis not present

## 2019-12-05 DIAGNOSIS — J449 Chronic obstructive pulmonary disease, unspecified: Secondary | ICD-10-CM | POA: Diagnosis not present

## 2019-12-05 DIAGNOSIS — J189 Pneumonia, unspecified organism: Secondary | ICD-10-CM | POA: Diagnosis not present

## 2019-12-22 DIAGNOSIS — J441 Chronic obstructive pulmonary disease with (acute) exacerbation: Secondary | ICD-10-CM | POA: Diagnosis not present

## 2019-12-22 DIAGNOSIS — E1140 Type 2 diabetes mellitus with diabetic neuropathy, unspecified: Secondary | ICD-10-CM | POA: Diagnosis not present

## 2019-12-22 DIAGNOSIS — E782 Mixed hyperlipidemia: Secondary | ICD-10-CM | POA: Diagnosis not present

## 2019-12-22 DIAGNOSIS — K219 Gastro-esophageal reflux disease without esophagitis: Secondary | ICD-10-CM | POA: Diagnosis not present

## 2020-01-05 DIAGNOSIS — J189 Pneumonia, unspecified organism: Secondary | ICD-10-CM | POA: Diagnosis not present

## 2020-01-05 DIAGNOSIS — J449 Chronic obstructive pulmonary disease, unspecified: Secondary | ICD-10-CM | POA: Diagnosis not present

## 2020-01-11 DIAGNOSIS — H401133 Primary open-angle glaucoma, bilateral, severe stage: Secondary | ICD-10-CM | POA: Diagnosis not present

## 2020-01-11 DIAGNOSIS — Z961 Presence of intraocular lens: Secondary | ICD-10-CM | POA: Diagnosis not present

## 2020-02-03 DIAGNOSIS — Z0000 Encounter for general adult medical examination without abnormal findings: Secondary | ICD-10-CM | POA: Diagnosis not present

## 2020-02-03 DIAGNOSIS — E1169 Type 2 diabetes mellitus with other specified complication: Secondary | ICD-10-CM | POA: Diagnosis not present

## 2020-02-03 DIAGNOSIS — H4010X0 Unspecified open-angle glaucoma, stage unspecified: Secondary | ICD-10-CM | POA: Diagnosis not present

## 2020-02-03 DIAGNOSIS — L309 Dermatitis, unspecified: Secondary | ICD-10-CM | POA: Diagnosis not present

## 2020-02-05 DIAGNOSIS — J449 Chronic obstructive pulmonary disease, unspecified: Secondary | ICD-10-CM | POA: Diagnosis not present

## 2020-02-05 DIAGNOSIS — J189 Pneumonia, unspecified organism: Secondary | ICD-10-CM | POA: Diagnosis not present

## 2020-02-10 DIAGNOSIS — R0902 Hypoxemia: Secondary | ICD-10-CM | POA: Diagnosis not present

## 2020-02-10 DIAGNOSIS — E1140 Type 2 diabetes mellitus with diabetic neuropathy, unspecified: Secondary | ICD-10-CM | POA: Diagnosis not present

## 2020-02-10 DIAGNOSIS — J441 Chronic obstructive pulmonary disease with (acute) exacerbation: Secondary | ICD-10-CM | POA: Diagnosis not present

## 2020-02-10 DIAGNOSIS — D509 Iron deficiency anemia, unspecified: Secondary | ICD-10-CM | POA: Diagnosis not present

## 2020-02-10 DIAGNOSIS — K219 Gastro-esophageal reflux disease without esophagitis: Secondary | ICD-10-CM | POA: Diagnosis not present

## 2020-02-12 ENCOUNTER — Other Ambulatory Visit: Payer: Self-pay

## 2020-02-12 ENCOUNTER — Encounter (HOSPITAL_COMMUNITY)
Admission: RE | Admit: 2020-02-12 | Discharge: 2020-02-12 | Disposition: A | Discharge status: Home / Self Care | Payer: Medicare Other | Source: Ambulatory Visit | Attending: Internal Medicine | Admitting: Internal Medicine

## 2020-02-12 DIAGNOSIS — M810 Age-related osteoporosis without current pathological fracture: Secondary | ICD-10-CM | POA: Insufficient documentation

## 2020-02-12 MED ORDER — DENOSUMAB 60 MG/ML ~~LOC~~ SOSY
60.0000 mg | PREFILLED_SYRINGE | Freq: Once | SUBCUTANEOUS | Status: AC
  Administered 2020-02-12: 60 mg via SUBCUTANEOUS

## 2020-03-01 DIAGNOSIS — M79672 Pain in left foot: Secondary | ICD-10-CM | POA: Diagnosis not present

## 2020-03-01 DIAGNOSIS — M79671 Pain in right foot: Secondary | ICD-10-CM | POA: Diagnosis not present

## 2020-03-01 DIAGNOSIS — I739 Peripheral vascular disease, unspecified: Secondary | ICD-10-CM | POA: Diagnosis not present

## 2020-03-01 DIAGNOSIS — E1140 Type 2 diabetes mellitus with diabetic neuropathy, unspecified: Secondary | ICD-10-CM | POA: Diagnosis not present

## 2020-03-04 DIAGNOSIS — J189 Pneumonia, unspecified organism: Secondary | ICD-10-CM | POA: Diagnosis not present

## 2020-03-04 DIAGNOSIS — J449 Chronic obstructive pulmonary disease, unspecified: Secondary | ICD-10-CM | POA: Diagnosis not present

## 2020-04-04 DIAGNOSIS — J449 Chronic obstructive pulmonary disease, unspecified: Secondary | ICD-10-CM | POA: Diagnosis not present

## 2020-04-04 DIAGNOSIS — J189 Pneumonia, unspecified organism: Secondary | ICD-10-CM | POA: Diagnosis not present

## 2020-04-12 DIAGNOSIS — D509 Iron deficiency anemia, unspecified: Secondary | ICD-10-CM | POA: Diagnosis not present

## 2020-04-12 DIAGNOSIS — J441 Chronic obstructive pulmonary disease with (acute) exacerbation: Secondary | ICD-10-CM | POA: Diagnosis not present

## 2020-04-12 DIAGNOSIS — E1140 Type 2 diabetes mellitus with diabetic neuropathy, unspecified: Secondary | ICD-10-CM | POA: Diagnosis not present

## 2020-04-12 DIAGNOSIS — K219 Gastro-esophageal reflux disease without esophagitis: Secondary | ICD-10-CM | POA: Diagnosis not present

## 2020-04-12 DIAGNOSIS — E782 Mixed hyperlipidemia: Secondary | ICD-10-CM | POA: Diagnosis not present

## 2020-04-26 ENCOUNTER — Encounter (HOSPITAL_COMMUNITY): Payer: Self-pay

## 2020-04-26 ENCOUNTER — Inpatient Hospital Stay (HOSPITAL_COMMUNITY)
Admission: EM | Admit: 2020-04-26 | Discharge: 2020-05-17 | DRG: 025 | Disposition: E | Payer: Medicare Other | Attending: Neurosurgery | Admitting: Neurosurgery

## 2020-04-26 ENCOUNTER — Emergency Department (HOSPITAL_COMMUNITY): Payer: Medicare Other

## 2020-04-26 ENCOUNTER — Other Ambulatory Visit: Payer: Self-pay

## 2020-04-26 ENCOUNTER — Inpatient Hospital Stay (HOSPITAL_COMMUNITY): Payer: Medicare Other | Admitting: Certified Registered Nurse Anesthetist

## 2020-04-26 ENCOUNTER — Encounter: Payer: Self-pay | Admitting: Pulmonary Disease

## 2020-04-26 ENCOUNTER — Ambulatory Visit: Payer: Medicare Other | Admitting: Pulmonary Disease

## 2020-04-26 ENCOUNTER — Encounter (HOSPITAL_COMMUNITY): Admission: EM | Disposition: E | Payer: Self-pay | Source: Home / Self Care | Attending: Neurosurgery

## 2020-04-26 VITALS — BP 118/60 | HR 83 | Ht 62.5 in | Wt 160.0 lb

## 2020-04-26 DIAGNOSIS — R000 Tachycardia, unspecified: Secondary | ICD-10-CM | POA: Diagnosis not present

## 2020-04-26 DIAGNOSIS — I110 Hypertensive heart disease with heart failure: Secondary | ICD-10-CM | POA: Diagnosis not present

## 2020-04-26 DIAGNOSIS — J9611 Chronic respiratory failure with hypoxia: Secondary | ICD-10-CM | POA: Diagnosis not present

## 2020-04-26 DIAGNOSIS — J9690 Respiratory failure, unspecified, unspecified whether with hypoxia or hypercapnia: Secondary | ICD-10-CM

## 2020-04-26 DIAGNOSIS — J9622 Acute and chronic respiratory failure with hypercapnia: Secondary | ICD-10-CM | POA: Diagnosis not present

## 2020-04-26 DIAGNOSIS — J439 Emphysema, unspecified: Secondary | ICD-10-CM | POA: Diagnosis not present

## 2020-04-26 DIAGNOSIS — E669 Obesity, unspecified: Secondary | ICD-10-CM | POA: Diagnosis present

## 2020-04-26 DIAGNOSIS — J449 Chronic obstructive pulmonary disease, unspecified: Secondary | ICD-10-CM

## 2020-04-26 DIAGNOSIS — Z66 Do not resuscitate: Secondary | ICD-10-CM | POA: Diagnosis not present

## 2020-04-26 DIAGNOSIS — Z8249 Family history of ischemic heart disease and other diseases of the circulatory system: Secondary | ICD-10-CM

## 2020-04-26 DIAGNOSIS — Z982 Presence of cerebrospinal fluid drainage device: Secondary | ICD-10-CM

## 2020-04-26 DIAGNOSIS — G919 Hydrocephalus, unspecified: Secondary | ICD-10-CM | POA: Diagnosis present

## 2020-04-26 DIAGNOSIS — J9811 Atelectasis: Secondary | ICD-10-CM | POA: Diagnosis not present

## 2020-04-26 DIAGNOSIS — J9621 Acute and chronic respiratory failure with hypoxia: Secondary | ICD-10-CM | POA: Diagnosis not present

## 2020-04-26 DIAGNOSIS — R0689 Other abnormalities of breathing: Secondary | ICD-10-CM | POA: Diagnosis not present

## 2020-04-26 DIAGNOSIS — J9612 Chronic respiratory failure with hypercapnia: Secondary | ICD-10-CM | POA: Diagnosis not present

## 2020-04-26 DIAGNOSIS — R296 Repeated falls: Secondary | ICD-10-CM | POA: Diagnosis present

## 2020-04-26 DIAGNOSIS — F329 Major depressive disorder, single episode, unspecified: Secondary | ICD-10-CM | POA: Diagnosis present

## 2020-04-26 DIAGNOSIS — Z4659 Encounter for fitting and adjustment of other gastrointestinal appliance and device: Secondary | ICD-10-CM

## 2020-04-26 DIAGNOSIS — S065X0A Traumatic subdural hemorrhage without loss of consciousness, initial encounter: Secondary | ICD-10-CM | POA: Diagnosis not present

## 2020-04-26 DIAGNOSIS — Z9981 Dependence on supplemental oxygen: Secondary | ICD-10-CM

## 2020-04-26 DIAGNOSIS — J9601 Acute respiratory failure with hypoxia: Secondary | ICD-10-CM | POA: Diagnosis not present

## 2020-04-26 DIAGNOSIS — Z951 Presence of aortocoronary bypass graft: Secondary | ICD-10-CM

## 2020-04-26 DIAGNOSIS — S065X9A Traumatic subdural hemorrhage with loss of consciousness of unspecified duration, initial encounter: Principal | ICD-10-CM | POA: Diagnosis present

## 2020-04-26 DIAGNOSIS — I499 Cardiac arrhythmia, unspecified: Secondary | ICD-10-CM | POA: Diagnosis not present

## 2020-04-26 DIAGNOSIS — Z6829 Body mass index (BMI) 29.0-29.9, adult: Secondary | ICD-10-CM

## 2020-04-26 DIAGNOSIS — Z743 Need for continuous supervision: Secondary | ICD-10-CM | POA: Diagnosis not present

## 2020-04-26 DIAGNOSIS — Z452 Encounter for adjustment and management of vascular access device: Secondary | ICD-10-CM | POA: Diagnosis not present

## 2020-04-26 DIAGNOSIS — I5032 Chronic diastolic (congestive) heart failure: Secondary | ICD-10-CM | POA: Diagnosis not present

## 2020-04-26 DIAGNOSIS — Z515 Encounter for palliative care: Secondary | ICD-10-CM | POA: Diagnosis not present

## 2020-04-26 DIAGNOSIS — G8194 Hemiplegia, unspecified affecting left nondominant side: Secondary | ICD-10-CM | POA: Diagnosis not present

## 2020-04-26 DIAGNOSIS — D638 Anemia in other chronic diseases classified elsewhere: Secondary | ICD-10-CM | POA: Diagnosis not present

## 2020-04-26 DIAGNOSIS — E782 Mixed hyperlipidemia: Secondary | ICD-10-CM | POA: Diagnosis present

## 2020-04-26 DIAGNOSIS — S065XAA Traumatic subdural hemorrhage with loss of consciousness status unknown, initial encounter: Secondary | ICD-10-CM | POA: Diagnosis present

## 2020-04-26 DIAGNOSIS — R4020 Unspecified coma: Secondary | ICD-10-CM | POA: Diagnosis not present

## 2020-04-26 DIAGNOSIS — E1140 Type 2 diabetes mellitus with diabetic neuropathy, unspecified: Secondary | ICD-10-CM | POA: Diagnosis not present

## 2020-04-26 DIAGNOSIS — Z4682 Encounter for fitting and adjustment of non-vascular catheter: Secondary | ICD-10-CM | POA: Diagnosis not present

## 2020-04-26 DIAGNOSIS — I2510 Atherosclerotic heart disease of native coronary artery without angina pectoris: Secondary | ICD-10-CM | POA: Diagnosis not present

## 2020-04-26 DIAGNOSIS — S299XXA Unspecified injury of thorax, initial encounter: Secondary | ICD-10-CM | POA: Diagnosis not present

## 2020-04-26 DIAGNOSIS — I6201 Nontraumatic acute subdural hemorrhage: Secondary | ICD-10-CM | POA: Diagnosis not present

## 2020-04-26 DIAGNOSIS — E8729 Other acidosis: Secondary | ICD-10-CM

## 2020-04-26 DIAGNOSIS — Z880 Allergy status to penicillin: Secondary | ICD-10-CM

## 2020-04-26 DIAGNOSIS — Z7982 Long term (current) use of aspirin: Secondary | ICD-10-CM

## 2020-04-26 DIAGNOSIS — R69 Illness, unspecified: Secondary | ICD-10-CM | POA: Diagnosis not present

## 2020-04-26 DIAGNOSIS — Z885 Allergy status to narcotic agent status: Secondary | ICD-10-CM

## 2020-04-26 DIAGNOSIS — I6203 Nontraumatic chronic subdural hemorrhage: Secondary | ICD-10-CM | POA: Diagnosis not present

## 2020-04-26 DIAGNOSIS — S199XXA Unspecified injury of neck, initial encounter: Secondary | ICD-10-CM | POA: Diagnosis not present

## 2020-04-26 DIAGNOSIS — J441 Chronic obstructive pulmonary disease with (acute) exacerbation: Secondary | ICD-10-CM | POA: Diagnosis not present

## 2020-04-26 DIAGNOSIS — W19XXXA Unspecified fall, initial encounter: Secondary | ICD-10-CM | POA: Diagnosis not present

## 2020-04-26 DIAGNOSIS — E872 Acidosis: Secondary | ICD-10-CM | POA: Diagnosis not present

## 2020-04-26 DIAGNOSIS — G935 Compression of brain: Secondary | ICD-10-CM | POA: Diagnosis present

## 2020-04-26 DIAGNOSIS — R7401 Elevation of levels of liver transaminase levels: Secondary | ICD-10-CM | POA: Diagnosis not present

## 2020-04-26 DIAGNOSIS — E1165 Type 2 diabetes mellitus with hyperglycemia: Secondary | ICD-10-CM | POA: Diagnosis present

## 2020-04-26 DIAGNOSIS — Z20822 Contact with and (suspected) exposure to covid-19: Secondary | ICD-10-CM | POA: Diagnosis not present

## 2020-04-26 DIAGNOSIS — H409 Unspecified glaucoma: Secondary | ICD-10-CM | POA: Diagnosis not present

## 2020-04-26 DIAGNOSIS — Z79899 Other long term (current) drug therapy: Secondary | ICD-10-CM

## 2020-04-26 DIAGNOSIS — Z87891 Personal history of nicotine dependence: Secondary | ICD-10-CM

## 2020-04-26 DIAGNOSIS — I6200 Nontraumatic subdural hemorrhage, unspecified: Secondary | ICD-10-CM | POA: Diagnosis not present

## 2020-04-26 DIAGNOSIS — Z882 Allergy status to sulfonamides status: Secondary | ICD-10-CM

## 2020-04-26 DIAGNOSIS — R5381 Other malaise: Secondary | ICD-10-CM | POA: Diagnosis not present

## 2020-04-26 DIAGNOSIS — R404 Transient alteration of awareness: Secondary | ICD-10-CM | POA: Diagnosis not present

## 2020-04-26 DIAGNOSIS — Z7984 Long term (current) use of oral hypoglycemic drugs: Secondary | ICD-10-CM

## 2020-04-26 HISTORY — PX: CRANIOTOMY: SHX93

## 2020-04-26 LAB — BLOOD GAS, ARTERIAL
Acid-Base Excess: 4.2 mmol/L — ABNORMAL HIGH (ref 0.0–2.0)
Acid-Base Excess: 5 mmol/L — ABNORMAL HIGH (ref 0.0–2.0)
Bicarbonate: 26.9 mmol/L (ref 20.0–28.0)
Bicarbonate: 27.6 mmol/L (ref 20.0–28.0)
Drawn by: 27733
Drawn by: 41977
FIO2: 80
FIO2: 100
MECHVT: 400 mL
O2 Saturation: 96.8 %
O2 Saturation: 97.7 %
PEEP: 5 cmH2O
Patient temperature: 36.7
Patient temperature: 37
RATE: 22 resp/min
pCO2 arterial: 75.2 mmHg (ref 32.0–48.0)
pCO2 arterial: 76.8 mmHg (ref 32.0–48.0)
pH, Arterial: 7.24 — ABNORMAL LOW (ref 7.350–7.450)
pH, Arterial: 7.242 — ABNORMAL LOW (ref 7.350–7.450)
pO2, Arterial: 102 mmHg (ref 83.0–108.0)
pO2, Arterial: 125 mmHg — ABNORMAL HIGH (ref 83.0–108.0)

## 2020-04-26 LAB — RAPID URINE DRUG SCREEN, HOSP PERFORMED
Amphetamines: NOT DETECTED
Barbiturates: NOT DETECTED
Benzodiazepines: NOT DETECTED
Cocaine: NOT DETECTED
Opiates: NOT DETECTED
Tetrahydrocannabinol: NOT DETECTED

## 2020-04-26 LAB — TROPONIN I (HIGH SENSITIVITY)
Troponin I (High Sensitivity): 163 ng/L (ref <18)
Troponin I (High Sensitivity): 416 ng/L

## 2020-04-26 LAB — POCT I-STAT 7, (LYTES, BLD GAS, ICA,H+H)
Acid-Base Excess: 4 mmol/L — ABNORMAL HIGH (ref 0.0–2.0)
Bicarbonate: 32.3 mmol/L — ABNORMAL HIGH (ref 20.0–28.0)
Calcium, Ion: 1.29 mmol/L (ref 1.15–1.40)
HCT: 28 % — ABNORMAL LOW (ref 36.0–46.0)
Hemoglobin: 9.5 g/dL — ABNORMAL LOW (ref 12.0–15.0)
O2 Saturation: 100 %
Patient temperature: 37.3
Potassium: 4.3 mmol/L (ref 3.5–5.1)
Sodium: 139 mmol/L (ref 135–145)
TCO2: 34 mmol/L — ABNORMAL HIGH (ref 22–32)
pCO2 arterial: 69.2 mmHg (ref 32.0–48.0)
pH, Arterial: 7.278 — ABNORMAL LOW (ref 7.350–7.450)
pO2, Arterial: 216 mmHg — ABNORMAL HIGH (ref 83.0–108.0)

## 2020-04-26 LAB — COMPREHENSIVE METABOLIC PANEL
ALT: 23 U/L (ref 0–44)
AST: 31 U/L (ref 15–41)
Albumin: 3.7 g/dL (ref 3.5–5.0)
Alkaline Phosphatase: 31 U/L — ABNORMAL LOW (ref 38–126)
Anion gap: 8 (ref 5–15)
BUN: 23 mg/dL (ref 8–23)
CO2: 30 mmol/L (ref 22–32)
Calcium: 9.1 mg/dL (ref 8.9–10.3)
Chloride: 99 mmol/L (ref 98–111)
Creatinine, Ser: 0.68 mg/dL (ref 0.44–1.00)
GFR calc Af Amer: >60 mL/min (ref >60)
GFR calc non Af Amer: >60 mL/min (ref >60)
Glucose, Bld: 217 mg/dL — ABNORMAL HIGH (ref 70–99)
Potassium: 4 mmol/L (ref 3.5–5.1)
Sodium: 137 mmol/L (ref 135–145)
Total Bilirubin: 0.6 mg/dL (ref 0.3–1.2)
Total Protein: 7.4 g/dL (ref 6.5–8.1)

## 2020-04-26 LAB — POCT I-STAT, CHEM 8
BUN: 21 mg/dL (ref 8–23)
Calcium, Ion: 1.27 mmol/L (ref 1.15–1.40)
Chloride: 101 mmol/L (ref 98–111)
Creatinine, Ser: 0.7 mg/dL (ref 0.44–1.00)
Glucose, Bld: 179 mg/dL — ABNORMAL HIGH (ref 70–99)
HCT: 27 % — ABNORMAL LOW (ref 36.0–46.0)
Hemoglobin: 9.2 g/dL — ABNORMAL LOW (ref 12.0–15.0)
Potassium: 4.1 mmol/L (ref 3.5–5.1)
Sodium: 140 mmol/L (ref 135–145)
TCO2: 31 mmol/L (ref 22–32)

## 2020-04-26 LAB — CBC
HCT: 33 % — ABNORMAL LOW (ref 36.0–46.0)
Hemoglobin: 9.6 g/dL — ABNORMAL LOW (ref 12.0–15.0)
MCH: 29.5 pg (ref 26.0–34.0)
MCHC: 29.1 g/dL — ABNORMAL LOW (ref 30.0–36.0)
MCV: 101.5 fL — ABNORMAL HIGH (ref 80.0–100.0)
Platelets: 415 10*3/uL — ABNORMAL HIGH (ref 150–400)
RBC: 3.25 MIL/uL — ABNORMAL LOW (ref 3.87–5.11)
RDW: 13.9 % (ref 11.5–15.5)
WBC: 17.1 10*3/uL — ABNORMAL HIGH (ref 4.0–10.5)
nRBC: 0 % (ref 0.0–0.2)

## 2020-04-26 LAB — URINALYSIS, ROUTINE W REFLEX MICROSCOPIC
Bacteria, UA: NONE SEEN
Bilirubin Urine: NEGATIVE
Glucose, UA: NEGATIVE mg/dL
Hgb urine dipstick: NEGATIVE
Ketones, ur: NEGATIVE mg/dL
Leukocytes,Ua: NEGATIVE
Nitrite: NEGATIVE
Protein, ur: 30 mg/dL — AB
Specific Gravity, Urine: 1.016 (ref 1.005–1.030)
pH: 6 (ref 5.0–8.0)

## 2020-04-26 LAB — ETHANOL: Alcohol, Ethyl (B): <10 mg/dL (ref <10)

## 2020-04-26 LAB — BRAIN NATRIURETIC PEPTIDE: B Natriuretic Peptide: 69 pg/mL (ref 0.0–100.0)

## 2020-04-26 LAB — LACTIC ACID, PLASMA: Lactic Acid, Venous: 1.3 mmol/L (ref 0.5–1.9)

## 2020-04-26 LAB — SARS CORONAVIRUS 2 BY RT PCR (HOSPITAL ORDER, PERFORMED IN ~~LOC~~ HOSPITAL LAB): SARS Coronavirus 2: NEGATIVE

## 2020-04-26 LAB — AMMONIA: Ammonia: 39 umol/L — ABNORMAL HIGH (ref 9–35)

## 2020-04-26 LAB — MAGNESIUM: Magnesium: 1.5 mg/dL — ABNORMAL LOW (ref 1.7–2.4)

## 2020-04-26 SURGERY — CRANIOTOMY HEMATOMA EVACUATION SUBDURAL
Anesthesia: General | Site: Head | Laterality: Right

## 2020-04-26 MED ORDER — PROPOFOL 1000 MG/100ML IV EMUL
INTRAVENOUS | Status: AC
  Administered 2020-04-26: 20:00:00 5 mg
  Filled 2020-04-26: qty 100

## 2020-04-26 MED ORDER — VANCOMYCIN HCL 1000 MG IV SOLR
INTRAVENOUS | Status: DC | PRN
  Administered 2020-04-26: 1000 mg via INTRAVENOUS

## 2020-04-26 MED ORDER — ROCURONIUM 10MG/ML (10ML) SYRINGE FOR MEDFUSION PUMP - OPTIME
INTRAVENOUS | Status: DC | PRN
  Administered 2020-04-26: 50 mg via INTRAVENOUS

## 2020-04-26 MED ORDER — BUPIVACAINE HCL (PF) 0.5 % IJ SOLN
INTRAMUSCULAR | Status: DC | PRN
  Administered 2020-04-26: 7.5 mL

## 2020-04-26 MED ORDER — THROMBIN 20000 UNITS EX SOLR
CUTANEOUS | Status: AC
  Filled 2020-04-26: qty 20000

## 2020-04-26 MED ORDER — PHENYLEPHRINE HCL (PRESSORS) 10 MG/ML IV SOLN
INTRAVENOUS | Status: DC | PRN
  Administered 2020-04-26: 80 ug via INTRAVENOUS

## 2020-04-26 MED ORDER — FENTANYL CITRATE (PF) 250 MCG/5ML IJ SOLN
INTRAMUSCULAR | Status: DC | PRN
  Administered 2020-04-26: 100 ug via INTRAVENOUS

## 2020-04-26 MED ORDER — SODIUM CHLORIDE 0.9 % IV SOLN: INTRAVENOUS | Status: DC | PRN

## 2020-04-26 MED ORDER — ROCURONIUM BROMIDE 50 MG/5ML IV SOLN
100.0000 mg | Freq: Once | INTRAVENOUS | Status: AC
  Administered 2020-04-26: 100 mg via INTRAVENOUS

## 2020-04-26 MED ORDER — SODIUM CHLORIDE 0.9 % IV SOLN
INTRAVENOUS | Status: DC | PRN
  Administered 2020-04-26: 500 mL

## 2020-04-26 MED ORDER — PROPOFOL 500 MG/50ML IV EMUL
INTRAVENOUS | Status: DC | PRN
  Administered 2020-04-26: 5 ug/kg/min via INTRAVENOUS

## 2020-04-26 MED ORDER — ETOMIDATE 2 MG/ML IV SOLN
10.0000 mg | Freq: Once | INTRAVENOUS | Status: AC
  Administered 2020-04-26: 10 mg via INTRAVENOUS

## 2020-04-26 MED ORDER — FENTANYL CITRATE (PF) 100 MCG/2ML IJ SOLN
100.0000 ug | Freq: Once | INTRAMUSCULAR | Status: AC
  Administered 2020-04-26: 100 ug via INTRAVENOUS
  Filled 2020-04-26: qty 2

## 2020-04-26 MED ORDER — ALBUTEROL SULFATE (2.5 MG/3ML) 0.083% IN NEBU: 2.5000 mg | INHALATION_SOLUTION | Freq: Four times a day (QID) | RESPIRATORY_TRACT | 12 refills | Status: AC | PRN

## 2020-04-26 MED ORDER — THROMBIN 5000 UNITS EX SOLR
OROMUCOSAL | Status: DC | PRN
  Administered 2020-04-26: 5 mL via TOPICAL

## 2020-04-26 MED ORDER — PHENYLEPHRINE HCL-NACL 10-0.9 MG/250ML-% IV SOLN
INTRAVENOUS | Status: DC | PRN
  Administered 2020-04-26: 35 ug/min via INTRAVENOUS

## 2020-04-26 MED ORDER — 0.9 % SODIUM CHLORIDE (POUR BTL) OPTIME
TOPICAL | Status: DC | PRN
  Administered 2020-04-26 (×2): 1000 mL

## 2020-04-26 MED ORDER — VANCOMYCIN HCL IN DEXTROSE 1-5 GM/200ML-% IV SOLN
INTRAVENOUS | Status: AC
  Filled 2020-04-26: qty 200

## 2020-04-26 MED ORDER — SODIUM CHLORIDE 0.9 % IR SOLN
Status: DC | PRN
  Administered 2020-04-26: 3000 mL

## 2020-04-26 MED ORDER — LIDOCAINE-EPINEPHRINE 1 %-1:100000 IJ SOLN
INTRAMUSCULAR | Status: DC | PRN
  Administered 2020-04-26: 7.5 mL via INTRADERMAL

## 2020-04-26 MED ORDER — THROMBIN 20000 UNITS EX SOLR
CUTANEOUS | Status: DC | PRN
  Administered 2020-04-26: 20 mL via TOPICAL

## 2020-04-26 MED ORDER — BACITRACIN ZINC 500 UNIT/GM EX OINT
TOPICAL_OINTMENT | CUTANEOUS | Status: DC | PRN
  Administered 2020-04-26: 1 via TOPICAL

## 2020-04-26 SURGICAL SUPPLY — 79 items
APL SKNCLS STERI-STRIP NONHPOA (GAUZE/BANDAGES/DRESSINGS)
BENZOIN TINCTURE PRP APPL 2/3 (GAUZE/BANDAGES/DRESSINGS) IMPLANT
BLADE CLIPPER SURG (BLADE) ×3 IMPLANT
BNDG CMPR 75X41 PLY ABS (GAUZE/BANDAGES/DRESSINGS) ×1
BNDG GAUZE ELAST 4 BULKY (GAUZE/BANDAGES/DRESSINGS) ×2 IMPLANT
BNDG STRETCH 4X75 NS LF (GAUZE/BANDAGES/DRESSINGS) ×2 IMPLANT
BUR ACORN 6.0 PRECISION (BURR) ×2 IMPLANT
BUR ACORN 6.0MM PRECISION (BURR) ×1
BUR MATCHSTICK NEURO 3.0 LAGG (BURR) IMPLANT
BUR SPIRAL ROUTER 2.3 (BUR) ×1 IMPLANT
BUR SPIRAL ROUTER 2.3MM (BUR) ×1
CANISTER SUCT 3000ML PPV (MISCELLANEOUS) ×3 IMPLANT
CARTRIDGE OIL MAESTRO DRILL (MISCELLANEOUS) ×1 IMPLANT
CLIP VESOCCLUDE MED 6/CT (CLIP) IMPLANT
COVER BURR HOLE UNIV 10 (Orthopedic Implant) ×2 IMPLANT
COVER WAND RF STERILE (DRAPES) ×3 IMPLANT
DIFFUSER DRILL AIR PNEUMATIC (MISCELLANEOUS) ×3 IMPLANT
DRAIN SNY WOU 7FLT (WOUND CARE) ×2 IMPLANT
DRAPE NEUROLOGICAL W/INCISE (DRAPES) ×3 IMPLANT
DRAPE SURG 17X23 STRL (DRAPES) IMPLANT
DRAPE WARM FLUID 44X44 (DRAPES) ×3 IMPLANT
DRSG TELFA 3X8 NADH (GAUZE/BANDAGES/DRESSINGS) ×3 IMPLANT
DURAPREP 6ML APPLICATOR 50/CS (WOUND CARE) ×3 IMPLANT
ELECT REM PT RETURN 9FT ADLT (ELECTROSURGICAL) ×3
ELECTRODE REM PT RTRN 9FT ADLT (ELECTROSURGICAL) ×1 IMPLANT
EVACUATOR 1/8 PVC DRAIN (DRAIN) IMPLANT
EVACUATOR SILICONE 100CC (DRAIN) ×2 IMPLANT
GAUZE 4X4 16PLY RFD (DISPOSABLE) IMPLANT
GAUZE SPONGE 4X4 12PLY STRL (GAUZE/BANDAGES/DRESSINGS) ×3 IMPLANT
GLOVE BIO SURGEON STRL SZ7.5 (GLOVE) ×2 IMPLANT
GLOVE BIOGEL M 7.0 STRL (GLOVE) ×4 IMPLANT
GLOVE BIOGEL PI IND STRL 7.5 (GLOVE) ×2 IMPLANT
GLOVE BIOGEL PI INDICATOR 7.5 (GLOVE) ×6
GLOVE ECLIPSE 7.0 STRL STRAW (GLOVE) ×6 IMPLANT
GLOVE EXAM NITRILE XL STR (GLOVE) IMPLANT
GOWN STRL REUS W/ TWL LRG LVL3 (GOWN DISPOSABLE) ×2 IMPLANT
GOWN STRL REUS W/ TWL XL LVL3 (GOWN DISPOSABLE) IMPLANT
GOWN STRL REUS W/TWL 2XL LVL3 (GOWN DISPOSABLE) IMPLANT
GOWN STRL REUS W/TWL LRG LVL3 (GOWN DISPOSABLE) ×9
GOWN STRL REUS W/TWL XL LVL3 (GOWN DISPOSABLE)
HEMOSTAT POWDER KIT SURGIFOAM (HEMOSTASIS) ×3 IMPLANT
HEMOSTAT SURGICEL 2X14 (HEMOSTASIS) IMPLANT
KIT BASIN OR (CUSTOM PROCEDURE TRAY) ×3 IMPLANT
KIT TURNOVER KIT B (KITS) ×3 IMPLANT
NEEDLE HYPO 22GX1.5 SAFETY (NEEDLE) ×3 IMPLANT
NS IRRIG 1000ML POUR BTL (IV SOLUTION) ×3 IMPLANT
OIL CARTRIDGE MAESTRO DRILL (MISCELLANEOUS) ×3
PACK BATTERY CMF DISP FOR DVR (ORTHOPEDIC DISPOSABLE SUPPLIES) ×2 IMPLANT
PACK CRANIOTOMY CUSTOM (CUSTOM PROCEDURE TRAY) ×3 IMPLANT
PAD DRESSING TELFA 3X8 NADH (GAUZE/BANDAGES/DRESSINGS) IMPLANT
PATTIES SURGICAL .5 X.5 (GAUZE/BANDAGES/DRESSINGS) IMPLANT
PATTIES SURGICAL .5 X3 (DISPOSABLE) IMPLANT
PATTIES SURGICAL 1X1 (DISPOSABLE) IMPLANT
PLATE UNIV CMF 16 2H (Plate) ×6 IMPLANT
SCREW UNIII AXS SD 1.5X4 (Screw) ×22 IMPLANT
SET CYSTO W/LG BORE CLAMP LF (SET/KITS/TRAYS/PACK) ×2 IMPLANT
SPONGE NEURO XRAY DETECT 1X3 (DISPOSABLE) IMPLANT
SPONGE SURGIFOAM ABS GEL 100 (HEMOSTASIS) ×3 IMPLANT
STAPLER VISISTAT 35W (STAPLE) ×3 IMPLANT
STOCKINETTE 6  STRL (DRAPES) ×3
STOCKINETTE 6 STRL (DRAPES) ×1 IMPLANT
SUT ETHILON 3 0 FSL (SUTURE) IMPLANT
SUT ETHILON 3 0 PS 1 (SUTURE) IMPLANT
SUT NURALON 4 0 TR CR/8 (SUTURE) ×5 IMPLANT
SUT STEEL 0 (SUTURE)
SUT STEEL 0 18XMFL TIE 17 (SUTURE) IMPLANT
SUT VIC AB 0 CT1 18XCR BRD8 (SUTURE) ×2 IMPLANT
SUT VIC AB 0 CT1 8-18 (SUTURE) ×9
SUT VIC AB 3-0 SH 8-18 (SUTURE) ×4 IMPLANT
TAPE CLOTH 1X10 TAN NS (GAUZE/BANDAGES/DRESSINGS) ×3 IMPLANT
TAPE SURG TRANSPORE 1 IN (GAUZE/BANDAGES/DRESSINGS) IMPLANT
TAPE SURGICAL TRANSPORE 1 IN (GAUZE/BANDAGES/DRESSINGS) ×3
TOWEL GREEN STERILE (TOWEL DISPOSABLE) ×3 IMPLANT
TOWEL GREEN STERILE FF (TOWEL DISPOSABLE) ×3 IMPLANT
TRAY FOLEY MTR SLVR 16FR STAT (SET/KITS/TRAYS/PACK) ×3 IMPLANT
TUBE CONNECTING 12'X1/4 (SUCTIONS) ×1
TUBE CONNECTING 12X1/4 (SUCTIONS) ×2 IMPLANT
UNDERPAD 30X36 HEAVY ABSORB (UNDERPADS AND DIAPERS) ×3 IMPLANT
WATER STERILE IRR 1000ML POUR (IV SOLUTION) ×3 IMPLANT

## 2020-04-26 NOTE — Patient Instructions (Signed)
Call if you need an order sent to your pharmacy to cancel prescription for [facility]  Ask your eye doctor whether there are any inhalers we should avoid that could impact your glaucoma  Follow up in 6 months

## 2020-04-26 NOTE — ED Notes (Signed)
Date and time results received: 04/19/2020 2109 (use smartphrase ".now" to insert current time)  Test: pCO2 Critical Value: 76.8  Name of Provider Notified: Sabra Heck  Orders Received? Or Actions Taken?:

## 2020-04-26 NOTE — H&P (Signed)
necrosis: Yes Has patient had a PCN reaction that required hospitalization No Has patient had a PCN reaction occurring within the last 10 years: No If all of the above answers are "NO", then may proceed with Cephalosporin use.   . Sulfa Antibiotics Rash    Social History   Tobacco Use  . Smoking status: Former Smoker    Packs/day: 2.00    Types: Cigarettes    Start date: 01/15/1958    Quit date: 12/17/1993    Years since quitting: 26.3  . Smokeless tobacco: Never Used  Substance Use Topics  . Alcohol use: No     Family History  Problem Relation Age of Onset  . Cancer Father   . Hyperlipidemia Daughter   . Diabetes Brother   . Heart disease Brother      ROS   ROS intubated  Exam   Vitals:   04/16/2020 2030 05/05/2020 2100  BP: 119/74 137/72  Pulse: (!) 117 (!) 109  Resp: (!) 22 (!) 22  SpO2: 100% 100%   Intubated Low dose propofol running Pupils 88mm bilaterally Right briskly reactive, left sluggish Left corneal +, + gag No response to central pain  Results - Imaging/Labs   Results for orders placed or performed during the hospital encounter of 05/06/2020 (from the past 48 hour(s))    Blood gas, arterial     Status: Abnormal   Collection Time: 05/15/2020  6:56 PM  Result Value Ref Range   FIO2 80.00    pH, Arterial 7.240 (L) 7.350 - 7.450   pCO2 arterial 75.2 (HH) 32.0 - 48.0 mmHg    Comment: CRITICAL RESULT CALLED TO, READ BACK BY AND VERIFIED WITH: C BELTON,RN @1916  04/27/2020 MKELLY    pO2, Arterial 102 83.0 - 108.0 mmHg   Bicarbonate 26.9 20.0 - 28.0 mmol/L   Acid-Base Excess 4.2 (H) 0.0 - 2.0 mmol/L   O2 Saturation 96.8 %   Patient temperature 36.7    Collection site RIGHT RADIAL    Drawn by [id number]    Allens test (pass/fail) PASS PASS    Comment: Performed at [facility], [address], [place], [place] [address]  CBC     Status: Abnormal   Collection Time: 05/06/2020  7:25 PM  Result Value Ref Range   WBC 17.1 (H) 4.0 - 10.5 K/uL   RBC 3.25 (L) 3.87 - 5.11 MIL/uL   Hemoglobin 9.6 (L) 12.0 - 15.0 g/dL   HCT 33.0 (L) 36.0 - 46.0 %   MCV 101.5 (H) 80.0 - 100.0 fL   MCH 29.5 26.0 - 34.0 pg   MCHC 29.1 (L) 30.0 - 36.0 g/dL   RDW 13.9 11.5 - 15.5 %   Platelets 415 (H) 150 - 400 K/uL   nRBC 0.0 0.0 - 0.2 %    Comment: Performed at [facility], [address] [place], [place] [address]  Comprehensive metabolic panel     Status: Abnormal   Collection Time: 05/01/2020  7:25 PM  Result Value Ref Range   Sodium 137 135 - 145 mmol/L   Potassium 4.0 3.5 - 5.1 mmol/L   Chloride 99 98 - 111 mmol/L   CO2 30 22 - 32 mmol/L   Glucose, Bld 217 (H) 70 - 99 mg/dL    Comment: Glucose reference range applies only to samples taken after fasting for at least 8 hours.   BUN 23 8 - 23 mg/dL   Creatinine, Ser 0.68 0.44 - 1.00 mg/dL   Calcium 9.1 8.9 - 10.3 mg/dL  of the Act, 21 U.S.C. section 360bbb-3(b)(1), unless the authorization is terminated or revoked sooner. Performed at [facility], [address], [place], [place] [address]   Ammonia     Status: Abnormal   Collection Time: 05/13/2020  7:25 PM  Result Value Ref Range   Ammonia 39 (H) 9 - 35 umol/L    Comment: Performed at [facility], [address], [place], [place] [address]  Brain natriuretic peptide     Status: None   Collection Time: 04/22/2020  7:28 PM  Result Value Ref Range   B Natriuretic Peptide 69.0 0.0 - 100.0 pg/mL    Comment: Performed at [facility], [address], [place], [place] [address]  Urinalysis, Routine w reflex microscopic     Status: Abnormal   Collection Time: 05/02/2020  8:15 PM  Result Value Ref Range   Color, Urine YELLOW YELLOW   APPearance CLEAR CLEAR   Specific Gravity, Urine 1.016 1.005 - 1.030   pH 6.0 5.0 - 8.0   Glucose, UA  NEGATIVE NEGATIVE mg/dL   Hgb urine dipstick NEGATIVE NEGATIVE   Bilirubin Urine NEGATIVE NEGATIVE   Ketones, ur NEGATIVE NEGATIVE mg/dL   Protein, ur 30 (A) NEGATIVE mg/dL   Nitrite NEGATIVE NEGATIVE   Leukocytes,Ua NEGATIVE NEGATIVE   RBC / HPF 0-5 0 - 5 RBC/hpf   WBC, UA 0-5 0 - 5 WBC/hpf   Bacteria, UA NONE SEEN NONE SEEN   Mucus PRESENT     Comment: Performed at [facility], [address] [place], [place] [address]  Rapid urine drug screen (hospital performed)     Status: None   Collection Time: 04/24/2020  8:15 PM  Result Value Ref Range   Opiates NONE DETECTED NONE DETECTED   Cocaine NONE DETECTED NONE DETECTED   Benzodiazepines NONE DETECTED NONE DETECTED   Amphetamines NONE DETECTED NONE DETECTED   Tetrahydrocannabinol NONE DETECTED NONE DETECTED   Barbiturates NONE DETECTED NONE DETECTED    Comment: (NOTE) DRUG SCREEN FOR MEDICAL PURPOSES ONLY.  IF CONFIRMATION IS NEEDED FOR ANY PURPOSE, NOTIFY LAB WITHIN 5 DAYS. LOWEST DETECTABLE LIMITS FOR URINE DRUG SCREEN Drug Class                     Cutoff (ng/mL) Amphetamine and metabolites    1000 Barbiturate and metabolites    200 Benzodiazepine                 A999333 Tricyclics and metabolites     300 Opiates and metabolites        300 Cocaine and metabolites        300 THC                            50 Performed at [facility], [address], [place], [place] [address]   Blood gas, arterial     Status: Abnormal   Collection Time: 04/25/2020  8:54 PM  Result Value Ref Range   FIO2 100.00    Mode PRESSURE REGULATED VOLUME CONTROL    VT 400 mL   LHR 22 resp/min   Peep/cpap 5.0 cm H20   pH, Arterial 7.242 (L) 7.350 - 7.450   pCO2 arterial 76.8 (HH) 32.0 - 48.0 mmHg    Comment: CRITICAL RESULT CALLED TO, READ BACK BY AND VERIFIED WITH: T EASTER,RN @2109  05/05/2020 MKELLY    pO2, Arterial 125 (H) 83.0 - 108.0 mmHg   Bicarbonate 27.6 20.0 - 28.0 mmol/L  of the Act, 21 U.S.C. section 360bbb-3(b)(1), unless the authorization is terminated or revoked sooner. Performed at [facility], [address], [place], [place] [address]   Ammonia     Status: Abnormal   Collection Time: 05/13/2020  7:25 PM  Result Value Ref Range   Ammonia 39 (H) 9 - 35 umol/L    Comment: Performed at [facility], [address], [place], [place] [address]  Brain natriuretic peptide     Status: None   Collection Time: 04/22/2020  7:28 PM  Result Value Ref Range   B Natriuretic Peptide 69.0 0.0 - 100.0 pg/mL    Comment: Performed at [facility], [address], [place], [place] [address]  Urinalysis, Routine w reflex microscopic     Status: Abnormal   Collection Time: 05/02/2020  8:15 PM  Result Value Ref Range   Color, Urine YELLOW YELLOW   APPearance CLEAR CLEAR   Specific Gravity, Urine 1.016 1.005 - 1.030   pH 6.0 5.0 - 8.0   Glucose, UA  NEGATIVE NEGATIVE mg/dL   Hgb urine dipstick NEGATIVE NEGATIVE   Bilirubin Urine NEGATIVE NEGATIVE   Ketones, ur NEGATIVE NEGATIVE mg/dL   Protein, ur 30 (A) NEGATIVE mg/dL   Nitrite NEGATIVE NEGATIVE   Leukocytes,Ua NEGATIVE NEGATIVE   RBC / HPF 0-5 0 - 5 RBC/hpf   WBC, UA 0-5 0 - 5 WBC/hpf   Bacteria, UA NONE SEEN NONE SEEN   Mucus PRESENT     Comment: Performed at [facility], [address] [place], [place] [address]  Rapid urine drug screen (hospital performed)     Status: None   Collection Time: 04/24/2020  8:15 PM  Result Value Ref Range   Opiates NONE DETECTED NONE DETECTED   Cocaine NONE DETECTED NONE DETECTED   Benzodiazepines NONE DETECTED NONE DETECTED   Amphetamines NONE DETECTED NONE DETECTED   Tetrahydrocannabinol NONE DETECTED NONE DETECTED   Barbiturates NONE DETECTED NONE DETECTED    Comment: (NOTE) DRUG SCREEN FOR MEDICAL PURPOSES ONLY.  IF CONFIRMATION IS NEEDED FOR ANY PURPOSE, NOTIFY LAB WITHIN 5 DAYS. LOWEST DETECTABLE LIMITS FOR URINE DRUG SCREEN Drug Class                     Cutoff (ng/mL) Amphetamine and metabolites    1000 Barbiturate and metabolites    200 Benzodiazepine                 A999333 Tricyclics and metabolites     300 Opiates and metabolites        300 Cocaine and metabolites        300 THC                            50 Performed at [facility], [address], [place], [place] [address]   Blood gas, arterial     Status: Abnormal   Collection Time: 04/25/2020  8:54 PM  Result Value Ref Range   FIO2 100.00    Mode PRESSURE REGULATED VOLUME CONTROL    VT 400 mL   LHR 22 resp/min   Peep/cpap 5.0 cm H20   pH, Arterial 7.242 (L) 7.350 - 7.450   pCO2 arterial 76.8 (HH) 32.0 - 48.0 mmHg    Comment: CRITICAL RESULT CALLED TO, READ BACK BY AND VERIFIED WITH: T EASTER,RN @2109  05/05/2020 MKELLY    pO2, Arterial 125 (H) 83.0 - 108.0 mmHg   Bicarbonate 27.6 20.0 - 28.0 mmol/L  of the Act, 21 U.S.C. section 360bbb-3(b)(1), unless the authorization is terminated or revoked sooner. Performed at [facility], [address], [place], [place] [address]   Ammonia     Status: Abnormal   Collection Time: 05/13/2020  7:25 PM  Result Value Ref Range   Ammonia 39 (H) 9 - 35 umol/L    Comment: Performed at [facility], [address], [place], [place] [address]  Brain natriuretic peptide     Status: None   Collection Time: 04/22/2020  7:28 PM  Result Value Ref Range   B Natriuretic Peptide 69.0 0.0 - 100.0 pg/mL    Comment: Performed at [facility], [address], [place], [place] [address]  Urinalysis, Routine w reflex microscopic     Status: Abnormal   Collection Time: 05/02/2020  8:15 PM  Result Value Ref Range   Color, Urine YELLOW YELLOW   APPearance CLEAR CLEAR   Specific Gravity, Urine 1.016 1.005 - 1.030   pH 6.0 5.0 - 8.0   Glucose, UA  NEGATIVE NEGATIVE mg/dL   Hgb urine dipstick NEGATIVE NEGATIVE   Bilirubin Urine NEGATIVE NEGATIVE   Ketones, ur NEGATIVE NEGATIVE mg/dL   Protein, ur 30 (A) NEGATIVE mg/dL   Nitrite NEGATIVE NEGATIVE   Leukocytes,Ua NEGATIVE NEGATIVE   RBC / HPF 0-5 0 - 5 RBC/hpf   WBC, UA 0-5 0 - 5 WBC/hpf   Bacteria, UA NONE SEEN NONE SEEN   Mucus PRESENT     Comment: Performed at [facility], [address] [place], [place] [address]  Rapid urine drug screen (hospital performed)     Status: None   Collection Time: 04/24/2020  8:15 PM  Result Value Ref Range   Opiates NONE DETECTED NONE DETECTED   Cocaine NONE DETECTED NONE DETECTED   Benzodiazepines NONE DETECTED NONE DETECTED   Amphetamines NONE DETECTED NONE DETECTED   Tetrahydrocannabinol NONE DETECTED NONE DETECTED   Barbiturates NONE DETECTED NONE DETECTED    Comment: (NOTE) DRUG SCREEN FOR MEDICAL PURPOSES ONLY.  IF CONFIRMATION IS NEEDED FOR ANY PURPOSE, NOTIFY LAB WITHIN 5 DAYS. LOWEST DETECTABLE LIMITS FOR URINE DRUG SCREEN Drug Class                     Cutoff (ng/mL) Amphetamine and metabolites    1000 Barbiturate and metabolites    200 Benzodiazepine                 [id number] Tricyclics and metabolites     300 Opiates and metabolites        300 Cocaine and metabolites        300 THC                            50 Performed at [facility], [address], [place], [place] [address]   Blood gas, arterial     Status: Abnormal   Collection Time: 04/25/2020  8:54 PM  Result Value Ref Range   FIO2 100.00    Mode PRESSURE REGULATED VOLUME CONTROL    VT 400 mL   LHR 22 resp/min   Peep/cpap 5.0 cm H20   pH, Arterial 7.242 (L) 7.350 - 7.450   pCO2 arterial 76.8 (HH) 32.0 - 48.0 mmHg    Comment: CRITICAL RESULT CALLED TO, READ BACK BY AND VERIFIED WITH: T EASTER,RN @2109  05/05/2020 MKELLY    pO2, Arterial 125 (H) 83.0 - 108.0 mmHg   Bicarbonate 27.6 20.0 - 28.0 mmol/L  necrosis: Yes Has patient had a PCN reaction that required hospitalization No Has patient had a PCN reaction occurring within the last 10 years: No If all of the above answers are "NO", then may proceed with Cephalosporin use.   . Sulfa Antibiotics Rash    Social History   Tobacco Use  . Smoking status: Former Smoker    Packs/day: 2.00    Types: Cigarettes    Start date: 01/15/1958    Quit date: 12/17/1993    Years since quitting: 26.3  . Smokeless tobacco: Never Used  Substance Use Topics  . Alcohol use: No     Family History  Problem Relation Age of Onset  . Cancer Father   . Hyperlipidemia Daughter   . Diabetes Brother   . Heart disease Brother      ROS   ROS intubated  Exam   Vitals:   04/16/2020 2030 05/05/2020 2100  BP: 119/74 137/72  Pulse: (!) 117 (!) 109  Resp: (!) 22 (!) 22  SpO2: 100% 100%   Intubated Low dose propofol running Pupils 88mm bilaterally Right briskly reactive, left sluggish Left corneal +, + gag No response to central pain  Results - Imaging/Labs   Results for orders placed or performed during the hospital encounter of 05/06/2020 (from the past 48 hour(s))    Blood gas, arterial     Status: Abnormal   Collection Time: 05/15/2020  6:56 PM  Result Value Ref Range   FIO2 80.00    pH, Arterial 7.240 (L) 7.350 - 7.450   pCO2 arterial 75.2 (HH) 32.0 - 48.0 mmHg    Comment: CRITICAL RESULT CALLED TO, READ BACK BY AND VERIFIED WITH: C BELTON,RN @1916  04/27/2020 MKELLY    pO2, Arterial 102 83.0 - 108.0 mmHg   Bicarbonate 26.9 20.0 - 28.0 mmol/L   Acid-Base Excess 4.2 (H) 0.0 - 2.0 mmol/L   O2 Saturation 96.8 %   Patient temperature 36.7    Collection site RIGHT RADIAL    Drawn by [id number]    Allens test (pass/fail) PASS PASS    Comment: Performed at [facility], [address], [place], [place] [address]  CBC     Status: Abnormal   Collection Time: 05/06/2020  7:25 PM  Result Value Ref Range   WBC 17.1 (H) 4.0 - 10.5 K/uL   RBC 3.25 (L) 3.87 - 5.11 MIL/uL   Hemoglobin 9.6 (L) 12.0 - 15.0 g/dL   HCT 33.0 (L) 36.0 - 46.0 %   MCV 101.5 (H) 80.0 - 100.0 fL   MCH 29.5 26.0 - 34.0 pg   MCHC 29.1 (L) 30.0 - 36.0 g/dL   RDW 13.9 11.5 - 15.5 %   Platelets 415 (H) 150 - 400 K/uL   nRBC 0.0 0.0 - 0.2 %    Comment: Performed at [facility], [address] [place], [place] [address]  Comprehensive metabolic panel     Status: Abnormal   Collection Time: 05/01/2020  7:25 PM  Result Value Ref Range   Sodium 137 135 - 145 mmol/L   Potassium 4.0 3.5 - 5.1 mmol/L   Chloride 99 98 - 111 mmol/L   CO2 30 22 - 32 mmol/L   Glucose, Bld 217 (H) 70 - 99 mg/dL    Comment: Glucose reference range applies only to samples taken after fasting for at least 8 hours.   BUN 23 8 - 23 mg/dL   Creatinine, Ser 0.68 0.44 - 1.00 mg/dL   Calcium 9.1 8.9 - 10.3 mg/dL  necrosis: Yes Has patient had a PCN reaction that required hospitalization No Has patient had a PCN reaction occurring within the last 10 years: No If all of the above answers are "NO", then may proceed with Cephalosporin use.   . Sulfa Antibiotics Rash    Social History   Tobacco Use  . Smoking status: Former Smoker    Packs/day: 2.00    Types: Cigarettes    Start date: 01/15/1958    Quit date: 12/17/1993    Years since quitting: 26.3  . Smokeless tobacco: Never Used  Substance Use Topics  . Alcohol use: No     Family History  Problem Relation Age of Onset  . Cancer Father   . Hyperlipidemia Daughter   . Diabetes Brother   . Heart disease Brother      ROS   ROS intubated  Exam   Vitals:   04/16/2020 2030 05/05/2020 2100  BP: 119/74 137/72  Pulse: (!) 117 (!) 109  Resp: (!) 22 (!) 22  SpO2: 100% 100%   Intubated Low dose propofol running Pupils 88mm bilaterally Right briskly reactive, left sluggish Left corneal +, + gag No response to central pain  Results - Imaging/Labs   Results for orders placed or performed during the hospital encounter of 05/06/2020 (from the past 48 hour(s))    Blood gas, arterial     Status: Abnormal   Collection Time: 05/15/2020  6:56 PM  Result Value Ref Range   FIO2 80.00    pH, Arterial 7.240 (L) 7.350 - 7.450   pCO2 arterial 75.2 (HH) 32.0 - 48.0 mmHg    Comment: CRITICAL RESULT CALLED TO, READ BACK BY AND VERIFIED WITH: C BELTON,RN @1916  04/27/2020 MKELLY    pO2, Arterial 102 83.0 - 108.0 mmHg   Bicarbonate 26.9 20.0 - 28.0 mmol/L   Acid-Base Excess 4.2 (H) 0.0 - 2.0 mmol/L   O2 Saturation 96.8 %   Patient temperature 36.7    Collection site RIGHT RADIAL    Drawn by [id number]    Allens test (pass/fail) PASS PASS    Comment: Performed at [facility], [address], [place], [place] [address]  CBC     Status: Abnormal   Collection Time: 05/06/2020  7:25 PM  Result Value Ref Range   WBC 17.1 (H) 4.0 - 10.5 K/uL   RBC 3.25 (L) 3.87 - 5.11 MIL/uL   Hemoglobin 9.6 (L) 12.0 - 15.0 g/dL   HCT 33.0 (L) 36.0 - 46.0 %   MCV 101.5 (H) 80.0 - 100.0 fL   MCH 29.5 26.0 - 34.0 pg   MCHC 29.1 (L) 30.0 - 36.0 g/dL   RDW 13.9 11.5 - 15.5 %   Platelets 415 (H) 150 - 400 K/uL   nRBC 0.0 0.0 - 0.2 %    Comment: Performed at [facility], [address] [place], [place] [address]  Comprehensive metabolic panel     Status: Abnormal   Collection Time: 05/01/2020  7:25 PM  Result Value Ref Range   Sodium 137 135 - 145 mmol/L   Potassium 4.0 3.5 - 5.1 mmol/L   Chloride 99 98 - 111 mmol/L   CO2 30 22 - 32 mmol/L   Glucose, Bld 217 (H) 70 - 99 mg/dL    Comment: Glucose reference range applies only to samples taken after fasting for at least 8 hours.   BUN 23 8 - 23 mg/dL   Creatinine, Ser 0.68 0.44 - 1.00 mg/dL   Calcium 9.1 8.9 - 10.3 mg/dL  to left. There is also findings suggestive of parenchymal hemorrhage along the right lateral ventricle  in the right thalamus superiorly with evidence of extension into the lateral and third ventricles with some casting noted suggesting a more chronic bleed into the ventricle. Tentorial subdural hematoma is noted right greater than left. No definitive mass lesion is seen. A ventriculostomy catheter is noted extending into the left lateral ventricle stable from the prior exam. Vascular: No hyperdense vessel or unexpected calcification. Skull: Normal. Negative for fracture or focal lesion. Sinuses/Orbits: No acute finding. Other: None. CT CERVICAL SPINE FINDINGS Alignment: Within normal limits. Skull base and vertebrae: 7 cervical segments are well visualized. Mild facet hypertrophic changes are seen. No findings to suggest acute fracture or acute facet abnormality are noted. Only minimal osteophytic changes are seen. Soft tissues and spinal canal: Surrounding soft tissue structures show vascular calcification. Endotracheal tube is noted extending into the trachea. Fluid is noted within the proximal esophagus likely related to reflux. Upper chest: Visualized lung apices demonstrate some mild scarring. Other: None IMPRESSION: CT of the head: Large bilateral acute on chronic subdural hematomas right greater than left with midline shift from right to left as described. Additionally there are findings consistent with parenchymal hemorrhage within the right thalamus with extension into the lateral ventricles and third ventricle. Some casting is noted of the clot suggesting a slightly more subacute process. CT of the cervical spine: Mild degenerative change without acute abnormality. Critical Value/emergent results were called by telephone at the time of interpretation on 05/08/2020 at 8:02 pm to Dr. Noemi Chapel , who verbally acknowledged these results. Electronically Signed   By: Inez Catalina M.D.   On: 04/25/2020 20:11   DG Chest Port 1 View  Result Date: 04/18/2020 CLINICAL DATA:  Status post intubation today. EXAM:  PORTABLE CHEST 1 VIEW COMPARISON:  Single-view of the chest earlier today. FINDINGS: New endotracheal tube is in place with the tip in good position at the level of the clavicular heads. Right IJ catheter is unchanged. Left basilar atelectasis noted. Right lung clear. No pneumothorax or pleural effusion. Heart size upper normal. IMPRESSION: Endotracheal tube in good position.  No acute disease. Electronically Signed   By: Inge Rise M.D.   On: 04/23/2020 20:02   DG Chest Portable 1 View  Result Date: 05/11/2020 CLINICAL DATA:  Altered mental status EXAM: PORTABLE CHEST 1 VIEW COMPARISON:  07/25/2018 FINDINGS: Cardiac shadow is mildly prominent but stable. Postsurgical changes are seen. Right sided VP shunt catheter is noted and stable. The lungs are well aerated bilaterally. Chronic interstitial changes are seen without focal infiltrate. No bony abnormality is noted IMPRESSION: Chronic changes without acute abnormality. Electronically Signed   By: Inez Catalina M.D.   On: 05/01/2020 19:26   Impression/Plan   86 y.o. female with large right acute on chronic subdural and small left acute on chronic subdural hematoma. CT head also shows thalamic hemorrhage with intraventricular extension. She is intubated. Exam deficits as above although limited with propofol running.  We have discussed the situation with patient's daughters. We proceed emergently with right craniotomy for evacuation of SDH.  Ferne Reus, PA-C [facility] [employer]

## 2020-04-26 NOTE — ED Notes (Signed)
Date and time results received: 05/12/2020 2024 (use smartphrase ".now" to insert current time)  Test: Troponin Critical Value: 163  Name of Provider Notified: Sabra Heck  Orders Received? Or Actions Taken?:

## 2020-04-26 NOTE — ED Notes (Signed)
Date and time results received: 04/22/2020 1917 (use smartphrase ".now" to insert current time)  Test: PCO2 Critical Value: 75.2  Name of Provider Notified: Dr Sabra Heck  Orders Received? Or Actions Taken?: Actions Taken: no orders received

## 2020-04-26 NOTE — Op Note (Signed)
  NEUROSURGERY OPERATIVE NOTE   PREOP DIAGNOSIS: right acute subdural hematoma  POSTOP DIAGNOSIS: Same  PROCEDURE: 1. Right frontoparietal craniotomy for evacuation of subdural hematoma  SURGEON: Dr. [name], MD  ASSISTANT: Ferne Reus, PA-C  ANESTHESIA: General Endotracheal  EBL: 100cc  SPECIMENS: None  DRAINS: Subdural JP  COMPLICATIONS: None immediate  CONDITION: Hemodynamically stable to ICU  HISTORY: [name] is a 86 y.o. female presenting to the outside hospital after a fall obtunded.  Upon her initial evaluation by emergency personnel she was minimally conscious requiring intubation for airway protection.  CT scan was obtained which demonstrated a large right acute subdural hematoma with significant local mass-effect and midline shift.  She was therefore transferred to [facility] for surgical evacuation of the hematoma.  Treatment options were discussed in detail with the patient's family.  After a review of the risks, benefits, and alternatives, they elected to proceed with surgery.  They provided verbal consent.  PROCEDURE IN DETAIL: The patient was brought to the operating room via stretcher. After induction of general anesthesia, the patient was positioned on the operative table in the supine position. All pressure points were meticulously padded.  A standard reverse question mark right sided skin incision was then marked out and prepped and draped in the usual sterile fashion.  After timeout was conducted, the skin incision was infiltrated with local anesthetic. Skin incision was then made sharply, and Bovie electrocautery was used to dissect the subcutaneous tissue and the galea was incised. Hemostasis was achieved on the skin edges with Raney clips. A single piece myocutaneous flap was then elevated and retracted anteriorly. Bur holes were then created and a craniotomy was fashioned and elevated. Hemostasis was then achieved on the dural  surface with bipolar electrocautery and morselized Gelfoam with thrombin. The dura was then opened in curvilinear fashion.  Immediately underneath the dura a large acute subdural hematoma was identified.  This was removed using a combination of blunt dissectors and suction.  I was able to visualize the frontal cortex, parietal cortex and temporal cortex down to the middle cranial fossa floor.  I was able to fully evacuate the acute subdural hematoma.  There was an active arterial bleeder on the pial surface of the parietal cortex.  This was easily controlled with bipolar electrocautery.  Once this was done, the subdural space was irrigated with approximately 1 L of normal saline irrigation.  No active bleeding was identified.  The dura was then reapproximated with interrupted 4 Nurolon stitches.  A subdural drain was placed and tunneled subcutaneously.  The subdural space was then filled with normal saline irrigation.  A piece of Gelfoam was used to cover the dural surface.  The bone flap was then replaced and plated with standard titanium plates and screws.  The temporalis muscle was reapproximated with interrupted 0 Vicryl stitches.  The galea was closed with interrupted 0 Vicryl stitches and the skin was closed with staples.  The drain was secured in place with 0 Vicryl.  Bacitracin ointment, sterile dressing, and head wrap was applied.  At the end of the case all sponge, needle, instrument, and cottonoid counts were correct.  The patient was then transferred to the stretcher and taken to the intensive care unit in critical but stable hemodynamic condition.

## 2020-04-26 NOTE — Progress Notes (Signed)
[facility]  Chief Complaint  Patient presents with  . Consult    Patient is on 2 liters oxygen all the time. Patient feels like her breathing is doing good. occasional cough in the morning but not often.    Constitutional:  BP 118/60 (BP Location: Left Arm, Patient Position: Sitting, Cuff Size: Normal)   Pulse 83   Ht 5' 2.5" (1.588 m)   Wt 160 lb (72.6 kg)   SpO2 94%   BMI 28.80 kg/m   Past Medical History:  CAD s/p CABG, Depression, Glaucoma, Hydrocephalus s/p VP shunt, HLD, Nephrolithiasis, Spinal stenosis, DM  Summary:  [name] is a 86 y.o. female former smoker with COPD and chronic respiratory failure.  Subjective:   She was previously seen by Dr. Luan Pulling.    She quit smoking in he mid 1990's.  Smoked 2 packs per day prior to this.  She uses 2 liters O2 24/7.  She does her own housework still.  She lives alone.  She uses a walker.  She doesn't go out much.  Scared about falling.  Did get Moderna COVID vaccines.    Has cough with clear sputum in morning.  Not having wheeze, chest pain, hemoptysis, or leg swelling.  Sleeps okay.  Hasn't used spiriva for a while.  Only uses breo when her family reminds her to use it.  She doesn't feel like these help.  She uses albuterol once or twice per week.   Physical Exam:   Appearance - wearing oxygen  ENMT - no sinus tenderness, no nasal discharge, no oral exudate, Mallampati 2, wears dentures  Respiratory - decreased breath sounds, prolonged exhalation, no wheeze, or rales  CV - regular rate and rhythm, no murmurs  GI - soft, non tender  Lymph - no adenopathy noted in neck  Ext - no edema  Skin - no rashes  Neuro - normal strength, oriented x 3  Psych - normal mood and affect   Assessment/Plan:   COPD mixed type. - she hasn't been using spiriva or breo; didn't feel like these helped >> d/c'ed from medication list - continue prn albuterol - she will check with  her ophthalmologist about whether she can use LAMA's or SAMA's in setting of glaucoma - will need booster for pneumovax after November 2022  Chronic respiratory failure with hypoxia and hypercapnia in setting of COPD. - continue 2 liters supplemental oxygen - discussed symptoms/signs to monitor for that would indicate she has worsening hypercapnia and warrant further medical attention  A total of 48 minutes spent addressing patient care issues on day of visit.   Follow up:  Patient Instructions  Call if you need an order sent to your pharmacy to cancel prescription for [facility]  Ask your eye doctor whether there are any inhalers we should avoid that could impact your glaucoma  Follow up in 6 months   Signature:  Chesley Mires, MD [place] Pager: [phone] 05/04/2020, 10:08 AM  Flow Sheet     Pulmonary tests:   ABG on 3 liters 07/26/18 >> pH 7.46, PCO2 49.1, PO2 59.8  Sleep tests:   PSG 11/17/16 >> AHI 0, SpO2 low 80%, used 2 liters O2  Cardiac tests:   Echo 10/28/16 >> EF 60 to 65%, mild LVH, grade 2 DD, PAS 35 mmHg  Medications:   Allergies as of 05/07/2020      Reactions   Codeine Other (See Comments)   Made teeth loose  Penicillins Rash   Has patient had a PCN reaction causing immediate rash, facial/tongue/throat swelling, SOB or lightheadedness with hypotension: No Has patient had a PCN reaction causing severe rash involving mucus membranes or skin necrosis: Yes Has patient had a PCN reaction that required hospitalization No Has patient had a PCN reaction occurring within the last 10 years: No If all of the above answers are "NO", then may proceed with Cephalosporin use.   Sulfa Antibiotics Rash      Medication List       Accurate as of Apr 26, 2020 10:08 AM. If you have any questions, ask your nurse or doctor.        STOP taking these medications   Breo Ellipta 100-25 MCG/INH Aepb Generic drug: fluticasone furoate-vilanterol  Stopped by: Chesley Mires, MD   latanoprost 0.005 % ophthalmic solution Commonly known as: XALATAN Stopped by: Chesley Mires, MD   tiotropium 18 MCG inhalation capsule Commonly known as: Spiriva HandiHaler Stopped by: Chesley Mires, MD     TAKE these medications   acetaminophen 500 MG tablet Commonly known as: TYLENOL Take 500 mg by mouth every 6 (six) hours as needed for mild pain or moderate pain.   albuterol 108 (90 Base) MCG/ACT inhaler Commonly known as: VENTOLIN HFA Inhale 2 puffs into the lungs every 6 (six) hours as needed for wheezing or shortness of breath.   albuterol (2.5 MG/3ML) 0.083% nebulizer solution Commonly known as: PROVENTIL Take 3 mLs (2.5 mg total) by nebulization every 6 (six) hours as needed for wheezing or shortness of breath. ICD 10 code J44.9   aspirin EC 81 MG tablet Take 81 mg by mouth every morning.   brimonidine 0.2 % ophthalmic solution Commonly known as: ALPHAGAN Place 1 drop into both eyes 3 (three) times daily.   Calcium 600+D 600-800 MG-UNIT Tabs Generic drug: Calcium Carb-Cholecalciferol Take 1 tablet by mouth 2 (two) times daily.   Choline Fenofibrate 135 MG capsule Take 135 mg by mouth every morning.   compounded topicals builder Apply 1 application topically 3 (three) times daily. (Lidocaine, Menthol, Diclofenac, Baclofen)   denosumab 60 MG/ML Sosy injection Commonly known as: PROLIA Inject 60 mg into the skin every 6 (six) months.   dorzolamide-timolol 22.3-6.8 MG/ML ophthalmic solution Commonly known as: COSOPT Place 1 drop into both eyes 2 (two) times daily.   Dulcolax 10 MG suppository Generic drug: bisacodyl Place 10 mg rectally as needed for moderate constipation.   gabapentin 100 MG capsule Commonly known as: NEURONTIN Take 100 mg by mouth 4 (four) times daily.   lisinopril 2.5 MG tablet Commonly known as: ZESTRIL Take 2.5 mg by mouth daily.   lubiprostone 24 MCG capsule Commonly known as: AMITIZA Take 24 mcg by  mouth 2 (two) times daily with a meal.   multivitamin with minerals tablet Take 2 tablets by mouth daily. Complete Multivitamin 50+   nitroGLYCERIN 0.4 MG SL tablet Commonly known as: NITROSTAT Place 1 tablet (0.4 mg total) under the tongue every 5 (five) minutes as needed for chest pain.   Omega 3 1200 MG Caps Take 1 capsule by mouth 2 (two) times daily.   OXYGEN Inhale 2 L into the lungs continuous.   pantoprazole 40 MG tablet Commonly known as: PROTONIX Take 40 mg by mouth every morning.   pioglitazone 15 MG tablet Commonly known as: ACTOS Take 15 mg by mouth daily.   polyethylene glycol 17 g packet Commonly known as: MIRALAX / GLYCOLAX Take 17 g by mouth at bedtime.  Rocklatan 0.02-0.005 % Soln Generic drug: Netarsudil-Latanoprost Place 1 drop into both eyes at bedtime.   rOPINIRole 1 MG tablet Commonly known as: REQUIP Take 1 mg by mouth at bedtime.   rosuvastatin 40 MG tablet Commonly known as: CRESTOR Take 40 mg by mouth every evening.   Savella 50 MG Tabs tablet Generic drug: Milnacipran Take 50 mg by mouth 2 (two) times daily.   sitaGLIPtin-metformin 50-1000 MG tablet Commonly known as: JANUMET Take 1 tablet by mouth 2 (two) times daily with a meal.   Vascepa 1 g capsule Generic drug: icosapent Ethyl Take 2 g by mouth 2 (two) times daily.       Past Surgical History:  She  has a past surgical history that includes Back surgery; Kyphoplasty; Foot surgery; Ventriculoperitoneal shunt; Appendectomy; Coronary artery bypass graft (11/24/1991); and IR VERTEBROPLASTY CERV/THOR BX INC UNI/BIL INC/INJECT/IMAGING (04/19/2017).  Family History:  Her family history includes Cancer in her father; Diabetes in her brother; Heart disease in her brother; Hyperlipidemia in her daughter.  Social History:  She  reports that she quit smoking about 26 years ago. Her smoking use included cigarettes. She started smoking about 62 years ago. She smoked 2.00 packs per day. She  has never used smokeless tobacco. She reports that she does not drink alcohol or use drugs.

## 2020-04-26 NOTE — Anesthesia Procedure Notes (Signed)
Arterial Line Insertion Start/End05/17/2021 10:25 PM, 04/26/2020 10:30 PM Performed by: Valetta Fuller, CRNA, CRNA  Left, radial was placed Catheter size: 20 G Hand hygiene performed  and maximum sterile barriers used   Attempts: 2 Procedure performed without using ultrasound guided technique. Following insertion, dressing applied and Biopatch. Post procedure assessment: normal

## 2020-04-26 NOTE — ED Triage Notes (Signed)
EMS reports pt coming from home.  Reports was called out for fall.  Reports fell earlier today but refused transport at that time.  Report was eating dinner, coughed like she was choking, got up and said she was going to use the bathroom and had a syncopal episode.  Family says she vomited prior to ems arrival.  Initially pt was alter to painful stimuli with ems but occasionally making purposeful movements.  Sinus tachycardia on monitor with occasional sinus arrhythmia.  BP 186/76, hr 105, 02 sat 90 % on 4liters.  Pt usually on o2 at 2 liters.  CBG 136.  PERRL.  DUring transport pt was unresponsive.  EMS reports crackles.  EDP reports pt has a gag reflex and has moved all extremities but has been nonverbal and eyes closed.

## 2020-04-26 NOTE — ED Provider Notes (Signed)
[facility] EMERGENCY DEPARTMENT Provider Note   CSN: [id number] Arrival date & time: 05/02/2020  1834     History Chief Complaint  Patient presents with  . Altered Mental Status  . Fall    [name] is a 86 y.o. female.  HPI   This patient is an 86 year old female, she has a known history of type 2 diabetes, history of hydrocephalus, history of coronary disease status post multivessel coronary artery bypass grafting.  She does have a history of acute respiratory failure due to hypercapnia and metabolic encephalopathy.  She has a history of COPD and a history of some obesity.  She presents by ambulance transport today with a complaint of altered mental status.  Reportedly there was a fall earlier in the day, the patient had refused transport at that time but the paramedics were called back out to the patient's residence where she was with her daughter when she went unresponsive when she stood up to go to the bathroom.  She had fallen to the ground, the person who was with her is not here at this time however one of her other daughters has come who is employed at another hospital and states that in the past she has had hypercapnic respiratory failure and wants her carbon dioxide level checked.  The paramedics reported that the patient is unable to answer any questions, she was totally unresponsive to painful stimuli but was apparently breathing, borderline hypoxic and requiring oxygen (the patient is on 2 to 3 L by nasal cannula chronically).  They reported that she was normotensive, she was borderline tachycardic at 105 bpm and tachypneic.  She had no response to painful stimuli prehospital.  Blood sugar level was just over 100, level 5 caveat applies secondary to altered mental status.  Past Medical History:  Diagnosis Date  . Coronary atherosclerosis of native coronary artery    Multivessel status post CABG  . Depression   . Glaucoma   . Hydrocephalus ([place])   . Hyperlipidemia   .  Nephrolithiasis   . Spinal stenosis   . Type 2 diabetes mellitus [facility])     Patient Active Problem List   Diagnosis Date Noted  . Acute and chronic respiratory failure with hypercapnia ([place]) 07/25/2018  . Acute lower UTI 07/25/2018  . Acute metabolic encephalopathy 01/75/1025  . Abnormality of gait and mobility 04/08/2018  . Hydrocephalus ([place]) 04/08/2018  . Acute respiratory failure with hypercapnia ([place]) 10/29/2016  . Acute diastolic heart failure ([place]) 10/29/2016  . Essential hypertension 10/29/2016  . Normocytic anemia 10/29/2016  . Diabetes mellitus with neuropathy ([place]) 10/29/2016  . COPD with exacerbation ([place]) 10/29/2016  . CAP (community acquired pneumonia) 10/26/2016  . Body mass index (BMI) of 30.0-30.9 in adult 11/23/2015  . Coronary atherosclerosis of native coronary artery 09/11/2013  . Mixed hyperlipidemia 09/11/2013  . Carotid artery occlusion without infarction 09/11/2013  . Difficulty walking 06/24/2013    Past Surgical History:  Procedure Laterality Date  . APPENDECTOMY    . BACK SURGERY    . CORONARY ARTERY BYPASS GRAFT  11/24/1991   SVG to OM1, SVG to RCA  . FOOT SURGERY    . IR VERTEBROPLASTY CERV/THOR BX INC UNI/BIL INC/INJECT/IMAGING  04/19/2017  . KYPHOPLASTY    . VENTRICULOPERITONEAL SHUNT       OB History    Gravida  3   Para  3   Term      Preterm      AB  Living  3     SAB      TAB      Ectopic      Multiple      Live Births              Family History  Problem Relation Age of Onset  . Cancer Father   . Hyperlipidemia Daughter   . Diabetes Brother   . Heart disease Brother     Social History   Tobacco Use  . Smoking status: Former Smoker    Packs/day: 2.00    Types: Cigarettes    Start date: 01/15/1958    Quit date: 12/17/1993    Years since quitting: 26.3  . Smokeless tobacco: Never Used  Substance Use Topics  . Alcohol use: No  . Drug use: No    Home Medications Prior to Admission medications     Medication Sig Start Date End Date Taking? Authorizing Provider  acetaminophen (TYLENOL) 500 MG tablet Take 500 mg by mouth every 6 (six) hours as needed for mild pain or moderate pain.    [provider]  albuterol (PROVENTIL) (2.5 MG/3ML) 0.083% nebulizer solution Take 3 mLs (2.5 mg total) by nebulization every 6 (six) hours as needed for wheezing or shortness of breath. ICD 10 code J44.9 04/25/2020   Chesley Mires, MD  albuterol (VENTOLIN HFA) 108 (90 Base) MCG/ACT inhaler Inhale 2 puffs into the lungs every 6 (six) hours as needed for wheezing or shortness of breath.    [provider]  aspirin EC 81 MG tablet Take 81 mg by mouth every morning.     [provider]  bisacodyl (DULCOLAX) 10 MG suppository Place 10 mg rectally as needed for moderate constipation.    [provider]  brimonidine (ALPHAGAN) 0.2 % ophthalmic solution Place 1 drop into both eyes 3 (three) times daily. 02/17/20   [provider]  Calcium Carb-Cholecalciferol (CALCIUM 600+D) 600-800 MG-UNIT TABS Take 1 tablet by mouth 2 (two) times daily.    [provider]  Choline Fenofibrate 135 MG capsule Take 135 mg by mouth every morning.     [provider]  compounded topicals builder Apply 1 application topically 3 (three) times daily. (Lidocaine, Menthol, Diclofenac, Baclofen)    [provider]  denosumab (PROLIA) 60 MG/ML SOSY injection Inject 60 mg into the skin every 6 (six) months.    [provider]  dorzolamide-timolol (COSOPT) 22.3-6.8 MG/ML ophthalmic solution Place 1 drop into both eyes 2 (two) times daily.  10/30/14   [provider]  gabapentin (NEURONTIN) 100 MG capsule Take 100 mg by mouth 4 (four) times daily.  09/29/14   [provider]  lisinopril (ZESTRIL) 2.5 MG tablet Take 2.5 mg by mouth daily. 04/04/20   [provider]  lubiprostone (AMITIZA) 24 MCG capsule Take 24 mcg by mouth 2 (two) times daily with a  meal.    [provider]  Milnacipran (SAVELLA) 50 MG TABS Take 50 mg by mouth 2 (two) times daily.    [provider]  Multiple Vitamins-Minerals (MULTIVITAMIN WITH MINERALS) tablet Take 2 tablets by mouth daily. Complete Multivitamin 50+    [provider]  nitroGLYCERIN (NITROSTAT) 0.4 MG SL tablet Place 1 tablet (0.4 mg total) under the tongue every 5 (five) minutes as needed for chest pain. 02/03/16   Satira Sark, MD  Omega 3 1200 MG CAPS Take 1 capsule by mouth 2 (two) times daily.     [provider]  OXYGEN Inhale 2 L into the lungs continuous.    [provider]  pantoprazole (PROTONIX) 40 MG tablet Take 40 mg by mouth every morning.  08/31/13   [provider]  pioglitazone (ACTOS) 15 MG tablet Take 15 mg by mouth daily.  10/03/14   [provider]  polyethylene glycol (MIRALAX / GLYCOLAX) packet Take 17 g by mouth at bedtime.    [provider]  ROCKLATAN 0.02-0.005 % SOLN Place 1 drop into both eyes at bedtime. 03/19/20   [provider]  rOPINIRole (REQUIP) 1 MG tablet Take 1 mg by mouth at bedtime.    [provider]  rosuvastatin (CRESTOR) 40 MG tablet Take 40 mg by mouth every evening.  08/20/16   [provider]  sitaGLIPtan-metformin (JANUMET) 50-1000 MG per tablet Take 1 tablet by mouth 2 (two) times daily with a meal.    [provider]  VASCEPA 1 g capsule Take 2 g by mouth 2 (two) times daily. 04/16/20   [provider]    Allergies    Codeine, Penicillins, and Sulfa antibiotics  Review of Systems   Review of Systems  Unable to perform ROS: Mental status change    Physical Exam Updated Vital Signs There were no vitals taken for this visit.  Physical Exam Vitals and nursing note reviewed.  Constitutional:      General: She is not in acute distress.    Appearance: She is well-developed.  HENT:     Head: Normocephalic and atraumatic.      Mouth/Throat:     Pharynx: No oropharyngeal exudate.  Eyes:     General: No scleral icterus.       Right eye: No discharge.        Left eye: No discharge.     Conjunctiva/sclera: Conjunctivae normal.     Pupils: Pupils are equal, round, and reactive to light.  Neck:     Thyroid: No thyromegaly.     Vascular: No JVD.  Cardiovascular:     Rate and Rhythm: Normal rate and regular rhythm.     Heart sounds: Normal heart sounds. No murmur. No friction rub. No gallop.   Pulmonary:     Effort: Respiratory distress present.     Breath sounds: Wheezing and rales present.  Abdominal:     General: Bowel sounds are normal. There is no distension.     Palpations: Abdomen is soft. There is no mass.     Tenderness: There is no abdominal tenderness.  Musculoskeletal:        General: No tenderness. Normal range of motion.     Cervical back: Normal range of motion and neck supple.     Right lower leg: Edema present.     Left lower leg: Edema present.  Lymphadenopathy:     Cervical: No cervical adenopathy.  Skin:    General: Skin is warm and dry.     Findings: No erythema or rash.  Neurological:     Mental Status: She is alert.     Comments: The patient is responsive to painful stimuli both by withdrawing her limbs, she has a gag reflex, she will not open her eyes, she will not talk.  She has no purposeful movements  Psychiatric:        Behavior: Behavior normal.     ED Results / Procedures / Treatments   Labs (all labs ordered are listed, but only abnormal results are displayed) Labs Reviewed  SARS CORONAVIRUS 2 BY RT PCR (  HOSPITAL ORDER, PERFORMED IN [place] [facility])  BLOOD GAS, ARTERIAL  CBC  COMPREHENSIVE METABOLIC PANEL  URINALYSIS, ROUTINE W REFLEX MICROSCOPIC  ETHANOL  RAPID URINE DRUG SCREEN, HOSP PERFORMED  LACTIC ACID, PLASMA  MAGNESIUM  AMMONIA  TROPONIN I (HIGH SENSITIVITY)    EKG EKG Interpretation  Date/Time:  Tuesday Apr 26 2020 18:42:46 EDT Ventricular  Rate:  110 PR Interval:    QRS Duration: 92 QT Interval:  332 QTC Calculation: 450 R Axis:   77 Text Interpretation: Sinus tachycardia Nonspecific repol abnormality, diffuse leads Since last tracing rate faster Confirmed by Noemi Chapel [id number]) on 04/17/2020 6:46:16 PM   Radiology DG Chest Portable 1 View  Result Date: 04/30/2020 CLINICAL DATA:  Altered mental status EXAM: PORTABLE CHEST 1 VIEW COMPARISON:  07/25/2018 FINDINGS: Cardiac shadow is mildly prominent but stable. Postsurgical changes are seen. Right sided VP shunt catheter is noted and stable. The lungs are well aerated bilaterally. Chronic interstitial changes are seen without focal infiltrate. No bony abnormality is noted IMPRESSION: Chronic changes without acute abnormality. Electronically Signed   By: Inez Catalina M.D.   On: 04/27/2020 19:26    Procedures .Critical Care Performed by: Noemi Chapel, MD Authorized by: Noemi Chapel, MD   Critical care provider statement:    Critical care time (minutes):  35   Critical care time was exclusive of:  Separately billable procedures and treating other patients and teaching time   Critical care was necessary to treat or prevent imminent or life-threatening deterioration of the following conditions:  Respiratory failure and CNS failure or compromise   Critical care was time spent personally by me on the following activities:  Blood draw for specimens, development of treatment plan with patient or surrogate, discussions with consultants, evaluation of patient's response to treatment, examination of patient, obtaining history from patient or surrogate, ordering and performing treatments and interventions, ordering and review of laboratory studies, ordering and review of radiographic studies, pulse oximetry, re-evaluation of patient's condition and review of old charts Comments:       Procedure Name: Intubation Date/Time: 05/05/2020 7:46 PM Performed by: Noemi Chapel,  MD Pre-anesthesia Checklist: Patient identified, Patient being monitored, Emergency Drugs available, Timeout performed and Suction available Oxygen Delivery Method: Non-rebreather mask Preoxygenation: Pre-oxygenation with 100% oxygen Induction Type: Rapid sequence Ventilation: Mask ventilation without difficulty Laryngoscope Size: Mac and 4 Tube size: 7.5 mm Number of attempts: 1 Airway Equipment and Method: Stylet (Direct laryngoscopy) Placement Confirmation: ETT inserted through vocal cords under direct vision,  CO2 detector and Breath sounds checked- equal and bilateral Secured at: 22 cm Tube secured with: ETT holder Dental Injury: Teeth and Oropharynx as per pre-operative assessment  Difficulty Due To: Difficulty was unanticipated Comments:        (including critical care time)  Medications Ordered in ED Medications - No data to display  ED Course  I have reviewed the triage vital signs and the nursing notes.  Pertinent labs & imaging results that were available during my care of the patient were reviewed by me and considered in my medical decision making (see chart for details).    MDM Rules/Calculators/A&P                      This patient is likely hypercapnic however her altered mental status could be any number of causes including hemorrhagic encephalopathy, hypertensive encephalopathy, hypoxic respiratory failure, pneumonia, ischemic stroke, hyperammonemia.  Labs ordered including CBC, metabolic panel, troponin, ammonia, ABG  Chest x-ray ordered  as well as CT scan of the head and cervical spine  The patient will be placed on oxygen, she will need to have an ABG to evaluate for the cause of possible hypercapnia, hypoxia, at this time she is protecting her airway but may need intubation if she worsens at all.  Will discuss with family regarding goals of care  EKG to rule out ischemia  I have personally reviewed and interpreted the EKG and there is not appear to be  any signs of ischemia, she does have a sinus tachycardia at 110 bpm. \ The patient was reevaluated prior to going to CT scan.  She essentially has no gag reflex at this point, I had a discussion with the family members at the bedside regarding possible intubation, they agree that they would want her to be on life support during this acute treatment phase assuming that this is something reversible.  The patient was intubated on first pass success without difficulty.  She is on her way to CT scan at this time at 7:45 PM  ABG shows in respiratory acidosis with a PCO2 of 75, given that the patient was not protecting her airway and was completely altered it was decided that BiPAP was not a sufficient treatment at that  I personally viewed the chest x-ray, I see no signs of infiltrate,  I have personally viewed the CT scan and unfortunately the patient has a very large right-sided subdural hematoma, she has a VP shunt for hydrocephalus, there is midline shift  Currently being sedated on propofol, current blood pressure 122/77  I discussed the case with the radiologist I discussed the case with the neurosurgery team, Dr. Kathyrn Sheriff will be the accepting attending and requested the patient be transported from our emergency department to the [facility] trauma center emergency department.  Discussed with the [facility] team, they will prepare the rapid transport, the patient is maintaining her airway with good oxygenation intubated. Head of the bed is been slightly elevated  Discussed the case with Dr. Langston Masker in the [facility] emergency department, except transfer the patient  ZAIN BINGMAN was evaluated in Emergency Department on 05/13/2020 for the symptoms described in the history of present illness. She was evaluated in the context of the global COVID-19 pandemic, which necessitated consideration that the patient might be at risk for infection with the SARS-CoV-2 virus that causes COVID-19. Institutional  protocols and algorithms that pertain to the evaluation of patients at risk for COVID-19 are in a state of rapid change based on information released by regulatory bodies including the CDC and federal and state organizations. These policies and algorithms were followed during the patient's care in the ED.   Final Clinical Impression(s) / ED Diagnoses Final diagnoses:  Subdural hematoma ([place])  Respiratory acidosis      Noemi Chapel, MD 04/30/2020 2026

## 2020-04-26 NOTE — Anesthesia Preprocedure Evaluation (Signed)
Anesthesia Evaluation  Patient identified by MRN, date of birth, ID band Patient unresponsive    Reviewed: Allergy & Precautions, NPO status , Patient's Chart, lab work & pertinent test results, Unable to perform ROS - Chart review onlyPreop documentation limited or incomplete due to emergent nature of procedure.  Airway Mallampati: Intubated       Dental   Pulmonary COPD, former smoker,    Pulmonary exam normal        Cardiovascular hypertension, Pt. on medications + CAD and + CABG  Normal cardiovascular exam     Neuro/Psych Depression    GI/Hepatic   Endo/Other  diabetes, Type 2  Renal/GU      Musculoskeletal   Abdominal   Peds  Hematology   Anesthesia Other Findings   Reproductive/Obstetrics                             Anesthesia Physical Anesthesia Plan  ASA: III  Anesthesia Plan: General   Post-op Pain Management:    Induction: Intravenous  PONV Risk Score and Plan: Ondansetron and Treatment may vary due to age or medical condition  Airway Management Planned: Oral ETT  Additional Equipment: Arterial line  Intra-op Plan:   Post-operative Plan: Post-operative intubation/ventilation  Informed Consent: I have reviewed the patients History and Physical, chart, labs and discussed the procedure including the risks, benefits and alternatives for the proposed anesthesia with the patient or authorized representative who has indicated his/her understanding and acceptance.       Plan Discussed with: CRNA and Surgeon  Anesthesia Plan Comments:         Anesthesia Quick Evaluation

## 2020-04-27 ENCOUNTER — Inpatient Hospital Stay (HOSPITAL_COMMUNITY): Payer: Medicare Other

## 2020-04-27 ENCOUNTER — Encounter: Payer: Self-pay | Admitting: *Deleted

## 2020-04-27 LAB — POCT I-STAT 7, (LYTES, BLD GAS, ICA,H+H)
Acid-Base Excess: 3 mmol/L — ABNORMAL HIGH (ref 0.0–2.0)
Bicarbonate: 28.3 mmol/L — ABNORMAL HIGH (ref 20.0–28.0)
Calcium, Ion: 1.24 mmol/L (ref 1.15–1.40)
HCT: 27 % — ABNORMAL LOW (ref 36.0–46.0)
Hemoglobin: 9.2 g/dL — ABNORMAL LOW (ref 12.0–15.0)
O2 Saturation: 99 %
Patient temperature: 99.2
Potassium: 4 mmol/L (ref 3.5–5.1)
Sodium: 139 mmol/L (ref 135–145)
TCO2: 30 mmol/L (ref 22–32)
pCO2 arterial: 45.9 mmHg (ref 32.0–48.0)
pH, Arterial: 7.398 (ref 7.350–7.450)
pO2, Arterial: 125 mmHg — ABNORMAL HIGH (ref 83.0–108.0)

## 2020-04-27 LAB — GLUCOSE, CAPILLARY
Glucose-Capillary: 112 mg/dL — ABNORMAL HIGH (ref 70–99)
Glucose-Capillary: 135 mg/dL — ABNORMAL HIGH (ref 70–99)
Glucose-Capillary: 139 mg/dL — ABNORMAL HIGH (ref 70–99)
Glucose-Capillary: 148 mg/dL — ABNORMAL HIGH (ref 70–99)
Glucose-Capillary: 152 mg/dL — ABNORMAL HIGH (ref 70–99)
Glucose-Capillary: 153 mg/dL — ABNORMAL HIGH (ref 70–99)
Glucose-Capillary: 194 mg/dL — ABNORMAL HIGH (ref 70–99)

## 2020-04-27 LAB — MRSA PCR SCREENING: MRSA by PCR: NEGATIVE

## 2020-04-27 LAB — TRIGLYCERIDES: Triglycerides: 199 mg/dL — ABNORMAL HIGH (ref <150)

## 2020-04-27 MED ORDER — METFORMIN HCL 500 MG PO TABS: 1000.0000 mg | ORAL_TABLET | Freq: Two times a day (BID) | ORAL | Status: DC

## 2020-04-27 MED ORDER — DOCUSATE SODIUM 50 MG/5ML PO LIQD: 100.0000 mg | Freq: Two times a day (BID) | ORAL | Status: DC

## 2020-04-27 MED ORDER — LINAGLIPTIN 5 MG PO TABS
5.0000 mg | ORAL_TABLET | Freq: Every day | ORAL | Status: DC
  Administered 2020-04-27: 5 mg
  Filled 2020-04-27: qty 1

## 2020-04-27 MED ORDER — ACETAMINOPHEN 650 MG RE SUPP: 650.0000 mg | RECTAL | Status: DC | PRN

## 2020-04-27 MED ORDER — BISACODYL 5 MG PO TBEC: 5.0000 mg | DELAYED_RELEASE_TABLET | Freq: Every day | ORAL | Status: DC | PRN

## 2020-04-27 MED ORDER — SODIUM CHLORIDE 0.9 % IV SOLN: INTRAVENOUS | Status: DC | PRN

## 2020-04-27 MED ORDER — DOCUSATE SODIUM 50 MG/5ML PO LIQD
100.0000 mg | Freq: Two times a day (BID) | ORAL | Status: DC
  Administered 2020-04-27 – 2020-05-01 (×9): 100 mg
  Filled 2020-04-27 (×9): qty 10

## 2020-04-27 MED ORDER — VANCOMYCIN HCL IN DEXTROSE 1-5 GM/200ML-% IV SOLN
1000.0000 mg | Freq: Once | INTRAVENOUS | Status: AC
  Administered 2020-04-27: 1000 mg via INTRAVENOUS
  Filled 2020-04-27: qty 200

## 2020-04-27 MED ORDER — ONDANSETRON HCL 4 MG PO TABS: 4.0000 mg | ORAL_TABLET | ORAL | Status: DC | PRN

## 2020-04-27 MED ORDER — GABAPENTIN 250 MG/5ML PO SOLN
100.0000 mg | Freq: Three times a day (TID) | ORAL | Status: DC
  Administered 2020-04-27 – 2020-05-01 (×16): 100 mg
  Filled 2020-04-27 (×17): qty 2

## 2020-04-27 MED ORDER — ONDANSETRON HCL 4 MG/2ML IJ SOLN: 4.0000 mg | INTRAMUSCULAR | Status: DC | PRN

## 2020-04-27 MED ORDER — PANTOPRAZOLE SODIUM 40 MG IV SOLR
40.0000 mg | Freq: Every day | INTRAVENOUS | Status: DC
  Administered 2020-04-27: 40 mg via INTRAVENOUS
  Filled 2020-04-27: qty 40

## 2020-04-27 MED ORDER — LINAGLIPTIN 5 MG PO TABS: 5.0000 mg | ORAL_TABLET | Freq: Every day | ORAL | Status: DC

## 2020-04-27 MED ORDER — CHLORHEXIDINE GLUCONATE 0.12% ORAL RINSE (MEDLINE KIT)
15.0000 mL | Freq: Two times a day (BID) | OROMUCOSAL | Status: DC
  Administered 2020-04-27 – 2020-05-02 (×11): 15 mL via OROMUCOSAL

## 2020-04-27 MED ORDER — NALOXONE HCL 0.4 MG/ML IJ SOLN: 0.0800 mg | INTRAMUSCULAR | Status: DC | PRN

## 2020-04-27 MED ORDER — POLYETHYLENE GLYCOL 3350 17 G PO PACK
17.0000 g | PACK | Freq: Every day | ORAL | Status: DC | PRN
  Administered 2020-04-27: 17 g
  Filled 2020-04-27: qty 1

## 2020-04-27 MED ORDER — GABAPENTIN 100 MG PO CAPS: 100.0000 mg | ORAL_CAPSULE | Freq: Three times a day (TID) | ORAL | Status: DC

## 2020-04-27 MED ORDER — ACETAMINOPHEN 325 MG PO TABS
650.0000 mg | ORAL_TABLET | ORAL | Status: DC | PRN
  Filled 2020-04-27: qty 2

## 2020-04-27 MED ORDER — ROPINIROLE HCL 1 MG PO TABS
1.0000 mg | ORAL_TABLET | Freq: Every day | ORAL | Status: DC
  Administered 2020-04-27 – 2020-05-01 (×6): 1 mg
  Filled 2020-04-27 (×6): qty 1

## 2020-04-27 MED ORDER — HYDROMORPHONE HCL 1 MG/ML IJ SOLN
0.5000 mg | INTRAMUSCULAR | Status: DC | PRN
  Administered 2020-05-02: 1 mg via INTRAVENOUS
  Filled 2020-04-27: qty 1

## 2020-04-27 MED ORDER — ACETAMINOPHEN 325 MG PO TABS
650.0000 mg | ORAL_TABLET | ORAL | Status: DC | PRN
  Administered 2020-04-27 – 2020-05-01 (×9): 650 mg
  Filled 2020-04-27 (×8): qty 2

## 2020-04-27 MED ORDER — DORZOLAMIDE HCL-TIMOLOL MAL 2-0.5 % OP SOLN
1.0000 [drp] | Freq: Two times a day (BID) | OPHTHALMIC | Status: DC
  Administered 2020-04-27 – 2020-05-01 (×10): 1 [drp] via OPHTHALMIC
  Filled 2020-04-27: qty 10

## 2020-04-27 MED ORDER — PROPOFOL 1000 MG/100ML IV EMUL
0.0000 ug/kg/min | INTRAVENOUS | Status: DC
  Administered 2020-04-27: 20 ug/kg/min via INTRAVENOUS
  Filled 2020-04-27: qty 100

## 2020-04-27 MED ORDER — LEVETIRACETAM IN NACL 500 MG/100ML IV SOLN
500.0000 mg | Freq: Two times a day (BID) | INTRAVENOUS | Status: DC
  Administered 2020-04-27 – 2020-04-28 (×5): 500 mg via INTRAVENOUS
  Filled 2020-04-27 (×6): qty 100

## 2020-04-27 MED ORDER — SENNA 8.6 MG PO TABS: 1.0000 | ORAL_TABLET | Freq: Two times a day (BID) | ORAL | Status: DC

## 2020-04-27 MED ORDER — POLYETHYLENE GLYCOL 3350 17 G PO PACK: 17.0000 g | PACK | Freq: Every day | ORAL | Status: DC | PRN

## 2020-04-27 MED ORDER — SENNA 8.6 MG PO TABS
1.0000 | ORAL_TABLET | Freq: Two times a day (BID) | ORAL | Status: DC
  Administered 2020-04-27 – 2020-05-01 (×9): 8.6 mg
  Filled 2020-04-27 (×9): qty 1

## 2020-04-27 MED ORDER — MILNACIPRAN HCL 50 MG PO TABS
50.0000 mg | ORAL_TABLET | Freq: Two times a day (BID) | ORAL | Status: DC
  Administered 2020-04-27 – 2020-05-01 (×12): 50 mg
  Filled 2020-04-27 (×12): qty 1

## 2020-04-27 MED ORDER — PROMETHAZINE HCL 25 MG PO TABS: 12.5000 mg | ORAL_TABLET | ORAL | Status: DC | PRN

## 2020-04-27 MED ORDER — PIOGLITAZONE HCL 15 MG PO TABS
15.0000 mg | ORAL_TABLET | Freq: Every day | ORAL | Status: DC
  Filled 2020-04-27: qty 1

## 2020-04-27 MED ORDER — PIOGLITAZONE HCL 15 MG PO TABS
15.0000 mg | ORAL_TABLET | Freq: Every day | ORAL | Status: DC
  Administered 2020-04-27: 15 mg
  Filled 2020-04-27: qty 1

## 2020-04-27 MED ORDER — LABETALOL HCL 5 MG/ML IV SOLN
10.0000 mg | INTRAVENOUS | Status: DC | PRN
  Administered 2020-04-27 (×3): 20 mg via INTRAVENOUS
  Administered 2020-04-28: 10 mg via INTRAVENOUS
  Administered 2020-04-28 – 2020-04-29 (×3): 20 mg via INTRAVENOUS
  Filled 2020-04-27 (×6): qty 4

## 2020-04-27 MED ORDER — MILNACIPRAN HCL 50 MG PO TABS
50.0000 mg | ORAL_TABLET | Freq: Two times a day (BID) | ORAL | Status: DC
  Filled 2020-04-27: qty 1

## 2020-04-27 MED ORDER — SITAGLIPTIN PHOS-METFORMIN HCL 50-1000 MG PO TABS: 1.0000 | ORAL_TABLET | Freq: Two times a day (BID) | ORAL | Status: DC

## 2020-04-27 MED ORDER — ROPINIROLE HCL 1 MG PO TABS
1.0000 mg | ORAL_TABLET | Freq: Every day | ORAL | Status: DC
  Filled 2020-04-27: qty 1

## 2020-04-27 MED ORDER — METFORMIN HCL 500 MG PO TABS
1000.0000 mg | ORAL_TABLET | Freq: Two times a day (BID) | ORAL | Status: DC
  Administered 2020-04-27: 1000 mg
  Filled 2020-04-27: qty 2

## 2020-04-27 MED ORDER — CHLORHEXIDINE GLUCONATE CLOTH 2 % EX PADS
6.0000 | MEDICATED_PAD | Freq: Every day | CUTANEOUS | Status: DC
  Administered 2020-04-28 – 2020-04-29 (×4): 6 via TOPICAL

## 2020-04-27 MED ORDER — POLYETHYLENE GLYCOL 3350 17 G PO PACK
17.0000 g | PACK | Freq: Every day | ORAL | Status: DC
  Administered 2020-04-28: 17 g via ORAL
  Filled 2020-04-27: qty 1

## 2020-04-27 MED ORDER — ORAL CARE MOUTH RINSE
15.0000 mL | OROMUCOSAL | Status: DC
  Administered 2020-04-27 – 2020-05-02 (×57): 15 mL via OROMUCOSAL

## 2020-04-27 MED ORDER — FLEET ENEMA 7-19 GM/118ML RE ENEM: 1.0000 | ENEMA | Freq: Once | RECTAL | Status: DC | PRN

## 2020-04-27 NOTE — Progress Notes (Signed)
  NEUROSURGERY PROGRESS NOTE   Pt seen and examined. No issues overnight.   EXAM: Temp:  [99.2 F (37.3 C)-102.1 F (38.9 C)] 102.1 F (38.9 C) (05/12 0800) Pulse Rate:  [95-137] 99 (05/12 1000) Resp:  [22-35] 29 (05/12 1000) BP: (94-174)/(52-92) 130/66 (05/12 1000) SpO2:  [83 %-100 %] 97 % (05/12 1000) Arterial Line BP: (121-183)/(51-71) 140/57 (05/12 0915) FiO2 (%):  [60 %-100 %] 60 % (05/12 0858) Weight:  [72 kg] 72 kg (05/11 1842) Intake/Output      05/11 0701 - 05/12 0700 05/12 0701 - 05/13 0700   I.V. (mL/kg) 48.2 (0.7)    IV Piggyback 350    Total Intake(mL/kg) 398.2 (5.5)    Urine (mL/kg/hr) 625    Drains 40    Blood 100    Total Output 765    Net -366.8          No eye opening Pupils reactive Breathing over vent Moves RUE/RLE spontaneously, no movement LUE/LLE Headwrap in place, JP 40cc blood tinged CSF  LABS: Lab Results  Component Value Date   CREATININE 0.70 05/13/2020   BUN 21 04/30/2020   NA 139 04/27/2020   K 4.0 04/27/2020   CL 101 04/28/2020   CO2 30 05/02/2020   Lab Results  Component Value Date   WBC 17.1 (H) 05/02/2020   HGB 9.2 (L) 04/27/2020   HCT 27.0 (L) 04/27/2020   MCV 101.5 (H) 05/07/2020   PLT 415 (H) 05/02/2020    IMAGING: CTH reviewed, good evacuation of acute SDH, improvement in MLS/ventricular effacement. Expected pneumocephalus with minimal brain re-expansion.  IMPRESSION: - 86 y.o. female POD #1 s/p right crani for SDH, clinically somewhat improved from preop. CT shows good evacuation.  PLAN: - Cont supportive care - SBP goal < 167mmHg

## 2020-04-27 NOTE — Progress Notes (Signed)
Patient transported from 4N29 to CT and back without complication.

## 2020-04-27 NOTE — Progress Notes (Signed)
NAME:  [name], MRN:  [id number], DOB:  1933/09/21, LOS: 1 ADMISSION DATE:  04/19/2020, CONSULTATION DATE: 04/27/2020 REFERRING MD: Kathyrn Sheriff, CHIEF COMPLAINT: Status post evacuation subdural hematoma  Brief History   86 year old admitted for evacuation of subdural hematoma  History of present illness   Patient is a 86 year old female with history of COPD hypoxemic and hypercapnic respiratory failure who has had a series of falls I believe to yesterday brought by EMT to the emergency room.  Patient had declining mental status in the emergency room requiring intubation.  ABG did show a PCO2 of 75 and CT scan of the head revealed a large right-sided subdural hematoma with midline shift pre-existing shunt for hydrocephalus. Patient was taken to the operating room for right frontoparietal craniotomy with evacuation of subdural hematoma.  She is seen by me in the neuro ICU postoperatively.  She is on high-dose propofol not responsive although she does have brisk corneals and spontaneous eye movement.  She currently is ventilated. Patient is seen regularly by Dr. Halford Chessman and as a matter fact was seen yesterday morning prior to these falls. Past Medical History   . Coronary atherosclerosis of native coronary artery    Multivessel status post CABG  . Depression   . Glaucoma   . Hydrocephalus ([place])   . Hyperlipidemia   . Nephrolithiasis   . Spinal stenosis   . Type 2 diabetes mellitus ([place])    . Acute and chronic respiratory failure with hypercapnia ([place]) 07/25/2018  . Acute lower UTI 07/25/2018  . Acute metabolic encephalopathy [id number]  . Abnormality of gait and mobility 04/08/2018  . Hydrocephalus ([place]) 04/08/2018  . Acute respiratory failure with hypercapnia ([place]) 10/29/2016  . Acute diastolic heart failure ([place]) 10/29/2016  . Essential hypertension 10/29/2016  . Normocytic anemia 10/29/2016  . Diabetes mellitus with neuropathy ([place]) 10/29/2016  . COPD with exacerbation  ([place]) 10/29/2016  . CAP (community acquired pneumonia) 10/26/2016  . Body mass index (BMI) of 30.0-30.9 in adult 11/23/2015  . Coronary atherosclerosis of native coronary artery 09/11/2013  . Mixed hyperlipidemia 09/11/2013  . Carotid artery occlusion without infarction 09/11/2013  . Difficulty walking 06/24/2013     Significant Hospital Events   Post craniotomy 04/21/2020  Consults:  Neurosurgery and PCCM  Procedures:  Craniotomy 5/11  Significant Diagnostic Tests:  CTH and ct cspine 5/11: CT of the head: Large bilateral acute on chronic subdural hematomas right greater than left with midline shift from right to left as described.  Additionally there are findings consistent with parenchymal hemorrhage within the right thalamus with extension into the lateral ventricles and third ventricle. Some casting is noted of the clot suggesting a slightly more subacute process.  CT of the cervical spine: Mild degenerative change without acute abnormality. [place] 5/12: 1. Interval right frontal craniotomy with expected postsurgical changes and interval decrease in size of the right cerebral convexity subdural hematoma. Unchanged 7 mm right to left midline shift. Decreased but persistent effacement of the right lateral and third ventricles. 2. Unchanged small intraparenchymal hematoma in the superior right thalamus. 3. Slight interval decrease in size of the small left cerebral convexity subdural hematoma. 4. Unchanged right-sided approach ventriculostomy and small volume intraventricular hemorrhage.  Micro Data:  sars2 5/11: neg Blood 5/12: resp 5/12:  Antimicrobials:  vanc 5/11->5/12 Ceftriaxone 5/13->  Interim history/subjective:  5/13: tmax 102.1 over past 24hr. No acute events otherwise. sbp goal per neurosx <119mmHg. Labs not drawn this am... will reorder Objective   Blood pressure (!) 94/52,  pulse (!) 104, temperature 100.1 F (37.8 C), temperature source Oral, resp.  rate (!) 28, height 5\' 2"  (1.575 m), weight 72 kg, SpO2 99 %.    Vent Mode: PRVC FiO2 (%):  [80 %-100 %] 80 % Set Rate:  [22 bmp-28 bmp] 28 bmp Vt Set:  [400 mL-500 mL] 400 mL PEEP:  [5 cmH20] 5 cmH20 Plateau Pressure:  [22 cmH20-28 cmH20] 22 cmH20   Intake/Output Summary (Last 24 hours) at 04/27/2020 0739 Last data filed at 04/27/2020 0600 Gross per 24 hour  Intake 398.18 ml  Output 765 ml  Net -366.82 ml   Filed Weights   05/07/2020 1842  Weight: 72 kg    Examination: General: White female opens eyes weakly to verbal stim but not following commands.  HENT: Postsurgical bandage otherwise unremarkable Lungs: Diminished but clear in both lung fields Cardiovascular: Regular rate and rhythm Abdomen: Benign bowel sounds non tender, non distended Extremities: Within normal limits, R hand cooler than L (arterial line removed) good pulses radially bilaterally. Withdraws b/l le to painful stim.  Neuro: As above GU: deferred  Resolved Hospital Problem list   NA  Assessment & Plan:  1.  Post craniotomy for subdural hematoma: Continue current mechanical ventilation further monitoring and intervention per neurosurgery -adding fentanyl prn  -d/c continuous propofol  2.  History of COPD:  -no acute exacerbation -cont bronchodilators  Acute hypoxic resp failure:  -intubated for airway protection -titrate vent -monitoring mental status for ability to extubate -sbt ok as long as tolerates, but no plans for extubation 2/2 mental status -ceftriaxone  Glaucoma:  -home eye drops  Dm2:  -recheck a1c -monitor bs -hold home oral agents Best practice:  Diet: tf to start Pain/Anxiety/Delirium protocol (if indicated): per protocol VAP protocol (if indicated): Yes DVT prophylaxis: SCDs GI prophylaxis: Yes Glucose control: Monitor Mobility: Bedrest Code Status: Full Family Communication: daughter at bedside Disposition: ICU  Labs   CBC: Recent Labs  Lab 04/24/2020 1925  05/01/2020 2250 05/11/2020 2259 04/27/20 0132  WBC 17.1*  --   --   --   HGB 9.6* 9.5* 9.2* 9.2*  HCT 33.0* 28.0* 27.0* 27.0*  MCV 101.5*  --   --   --   PLT 415*  --   --   --     Basic Metabolic Panel: Recent Labs  Lab 05/08/2020 1925 04/18/2020 2250 05/02/2020 2259 04/27/20 0132  NA 137 139 140 139  K 4.0 4.3 4.1 4.0  CL 99  --  101  --   CO2 30  --   --   --   GLUCOSE 217*  --  179*  --   BUN 23  --  21  --   CREATININE 0.68  --  0.70  --   CALCIUM 9.1  --   --   --   MG 1.5*  --   --   --    GFR: Estimated Creatinine Clearance: 48.7 mL/min (by C-G formula based on SCr of 0.7 mg/dL). Recent Labs  Lab 04/22/2020 1925  WBC 17.1*  LATICACIDVEN 1.3    Liver Function Tests: Recent Labs  Lab 05/01/2020 1925  AST 31  ALT 23  ALKPHOS 31*  BILITOT 0.6  PROT 7.4  ALBUMIN 3.7   No results for input(s): LIPASE, AMYLASE in the last 168 hours. Recent Labs  Lab 04/21/2020 1925  AMMONIA 39*    ABG    Component Value Date/Time   PHART 7.398 04/27/2020 0132   PCO2ART 45.9 04/27/2020  0132   PO2ART 125 (H) 04/27/2020 0132   HCO3 28.3 (H) 04/27/2020 0132   TCO2 30 04/27/2020 0132   O2SAT 99.0 04/27/2020 0132     Coagulation Profile: No results for input(s): INR, PROTIME in the last 168 hours.  Cardiac Enzymes: No results for input(s): CKTOTAL, CKMB, CKMBINDEX, TROPONINI in the last 168 hours.  HbA1C: Hgb A1c MFr Bld  Date/Time Value Ref Range Status  10/30/2016 04:44 AM 8.1 (H) 4.8 - 5.6 % Final    Comment:    (NOTE)         Pre-diabetes: 5.7 - 6.4         Diabetes: >6.4         Glycemic control for adults with diabetes: <7.0     CBG: Recent Labs  Lab 04/27/20 0343  GLUCAP 112*    Review of Systems:   Unable to obtain  Past Medical History  She,  has a past medical history of Coronary atherosclerosis of native coronary artery, Depression, Glaucoma, Hydrocephalus ([place]), Hyperlipidemia, Nephrolithiasis, Spinal stenosis, and Type 2 diabetes mellitus ([place]).    Surgical History    Past Surgical History:  Procedure Laterality Date  . APPENDECTOMY    . BACK SURGERY    . CARDIAC SURGERY    . CORONARY ARTERY BYPASS GRAFT  11/24/1991   SVG to OM1, SVG to RCA  . FOOT SURGERY    . IR VERTEBROPLASTY CERV/THOR BX INC UNI/BIL INC/INJECT/IMAGING  04/19/2017  . KYPHOPLASTY    . VENTRICULOPERITONEAL SHUNT       Social History   reports that she quit smoking about 26 years ago. Her smoking use included cigarettes. She started smoking about 62 years ago. She smoked 2.00 packs per day. She has never used smokeless tobacco. She reports that she does not drink alcohol or use drugs.   Family History   Her family history includes Cancer in her father; Diabetes in her brother; Heart disease in her brother; Hyperlipidemia in her daughter.   Allergies Allergies  Allergen Reactions  . Codeine Other (See Comments)    Made teeth loose  . Penicillins Rash       . Sulfa Antibiotics Rash     Home Medications  Prior to Admission medications   Medication Sig Start Date End Date Taking? Authorizing Provider  acetaminophen (TYLENOL) 500 MG tablet Take 1,000 mg by mouth in the morning and at bedtime.    Yes [provider]  albuterol (PROVENTIL) (2.5 MG/3ML) 0.083% nebulizer solution Take 3 mLs (2.5 mg total) by nebulization every 6 (six) hours as needed for wheezing or shortness of breath. ICD 10 code J44.9 04/25/2020  Yes Chesley Mires, MD  albuterol (VENTOLIN HFA) 108 (90 Base) MCG/ACT inhaler Inhale 2 puffs into the lungs every 6 (six) hours as needed for wheezing or shortness of breath.   Yes [provider]  aspirin EC 81 MG tablet Take 81 mg by mouth every morning.    Yes [provider]  bisacodyl (DULCOLAX) 10 MG suppository Place 10 mg rectally as needed for moderate constipation.   Yes [provider]  brimonidine (ALPHAGAN) 0.2 % ophthalmic solution Place 1 drop into both eyes 3 (three) times daily. 02/17/20  Yes [provider]  Calcium Carb-Cholecalciferol (CALCIUM 600+D) 600-800 MG-UNIT TABS Take 1 tablet by mouth 2 (two) times daily.   Yes [provider]  Choline Fenofibrate 135 MG capsule Take 135 mg by mouth every morning.    Yes [provider]  denosumab (PROLIA) 60 MG/ML SOSY injection Inject 60 mg into the skin every 6 (six) months.   Yes [provider]  dorzolamide-timolol (COSOPT) 22.3-6.8 MG/ML ophthalmic solution Place 1 drop into both eyes 2 (two) times daily.  10/30/14  Yes [provider]  gabapentin (NEURONTIN) 100 MG capsule Take 100 mg by mouth 3 (three) times daily.  09/29/14  Yes [provider]  lisinopril (ZESTRIL) 2.5 MG tablet Take 2.5 mg by mouth daily in the afternoon.  04/04/20  Yes [provider]  lubiprostone (AMITIZA) 24 MCG capsule Take 24 mcg by mouth 2 (two) times daily with a meal.   Yes [provider]  Milnacipran (SAVELLA) 50 MG TABS Take 50 mg by mouth 2 (two) times daily.   Yes [provider]  Multiple Vitamins-Minerals (MULTIVITAMIN WITH MINERALS) tablet Take 1 tablet by mouth in the morning. Complete Multivitamin 50+   Yes [provider]  nitroGLYCERIN (NITROSTAT) 0.4 MG SL tablet Place 1 tablet (0.4 mg total) under the tongue every 5 (five) minutes as needed for chest pain. 02/03/16  Yes Satira Sark, MD  OXYGEN Inhale 2 L into the lungs continuous.   Yes [provider]  pantoprazole (PROTONIX) 40 MG tablet Take 40 mg by mouth every morning.  08/31/13  Yes [provider]  pioglitazone (ACTOS) 15 MG tablet Take 15 mg by mouth in the morning.  10/03/14  Yes [provider]  polyethylene glycol (MIRALAX / GLYCOLAX) packet Take 17 g by mouth daily as needed for mild constipation or moderate constipation.    Yes [provider]  ROCKLATAN 0.02-0.005 % SOLN Place 1 drop into both eyes at bedtime. 03/19/20  Yes [provider]  rOPINIRole  (REQUIP) 1 MG tablet Take 1 mg by mouth at bedtime.   Yes [provider]  rosuvastatin (CRESTOR) 40 MG tablet Take 40 mg by mouth every evening.  08/20/16  Yes [provider]  sitaGLIPtan-metformin (JANUMET) 50-1000 MG per tablet Take 1 tablet by mouth 2 (two) times daily with a meal.   Yes [provider]  VASCEPA 1 g capsule Take 2 g by mouth 2 (two) times daily. 04/16/20  Yes [provider]     Critical care time: The patient is critically ill with multiple organ systems failure and requires high complexity decision making for assessment and support, frequent evaluation and titration of therapies, application of advanced monitoring technologies and extensive interpretation of multiple databases.  Critical care time 35 mins. This represents my time independent of the NPs time taking care of the pt. This is excluding procedures.    [facility] 04/27/2020, 12:13 PM

## 2020-04-27 NOTE — Consult Note (Signed)
NAME:  [name], MRN:  [id number], DOB:  Nov 18, 1933, LOS: 1 ADMISSION DATE:  05/04/2020, CONSULTATION DATE: 04/27/2020 REFERRING MD: Kathyrn Sheriff, CHIEF COMPLAINT: Status post evacuation subdural hematoma  Brief History   86 year old admitted for evacuation of subdural hematoma  History of present illness   Patient is a 86 year old female with history of COPD hypoxemic and hypercapnic respiratory failure who has had a series of falls I believe to yesterday brought by EMT to the emergency room.  Patient had declining mental status in the emergency room requiring intubation.  ABG did show a PCO2 of 75 and CT scan of the head revealed a large right-sided subdural hematoma with midline shift pre-existing shunt for hydrocephalus. Patient was taken to the operating room for right frontoparietal craniotomy with evacuation of subdural hematoma.  She is seen by me in the neuro ICU postoperatively.  She is on high-dose propofol not responsive although she does have brisk corneals and spontaneous eye movement.  She currently is ventilated. Patient is seen regularly by Dr. Halford Chessman and as a matter fact was seen yesterday morning prior to these falls. Past Medical History   . Coronary atherosclerosis of native coronary artery    Multivessel status post CABG  . Depression   . Glaucoma   . Hydrocephalus ([place])   . Hyperlipidemia   . Nephrolithiasis   . Spinal stenosis   . Type 2 diabetes mellitus ([place])    . Acute and chronic respiratory failure with hypercapnia ([place]) 07/25/2018  . Acute lower UTI 07/25/2018  . Acute metabolic encephalopathy [id number]  . Abnormality of gait and mobility 04/08/2018  . Hydrocephalus ([place]) 04/08/2018  . Acute respiratory failure with hypercapnia ([place]) 10/29/2016  . Acute diastolic heart failure ([place]) 10/29/2016  . Essential hypertension 10/29/2016  . Normocytic anemia 10/29/2016  . Diabetes mellitus with neuropathy ([place]) 10/29/2016  . COPD with exacerbation  ([place]) 10/29/2016  . CAP (community acquired pneumonia) 10/26/2016  . Body mass index (BMI) of 30.0-30.9 in adult 11/23/2015  . Coronary atherosclerosis of native coronary artery 09/11/2013  . Mixed hyperlipidemia 09/11/2013  . Carotid artery occlusion without infarction 09/11/2013  . Difficulty walking 06/24/2013     Significant Hospital Events   Post craniotomy 04/20/2020  Consults:  Neurosurgery and PCCM  Procedures:  Craniotomy  Significant Diagnostic Tests:  CT scan of the head  Micro Data:  NA  Antimicrobials:  NA  Interim history/subjective:  NA Objective   Blood pressure 132/77, pulse (!) 101, temperature 99.2 F (37.3 C), temperature source Oral, resp. rate (!) 28, height 5\' 2"  (1.575 m), weight 72 kg, SpO2 100 %.    Vent Mode: PRVC FiO2 (%):  [100 %] 100 % Set Rate:  [22 bmp-28 bmp] 28 bmp Vt Set:  [400 mL-500 mL] 400 mL PEEP:  [5 cmH20] 5 cmH20 Plateau Pressure:  [27 cmH20-28 cmH20] 27 cmH20   Intake/Output Summary (Last 24 hours) at 04/27/2020 0229 Last data filed at 04/27/2020 0000 Gross per 24 hour  Intake 250 ml  Output 165 ml  Net 85 ml   Filed Weights   05/13/2020 1842  Weight: 72 kg    Examination: General: White female heavily sedated ventilated HENT: Postsurgical bandage otherwise unremarkable Lungs: Diminished but clear in both lung fields Cardiovascular: Regular rate and rhythm Abdomen: Benign bowel sounds absent Extremities: Within normal limits Neuro: As above GU: N/A  Resolved Hospital Problem list   NA  Assessment & Plan:  1.  Post craniotomy for subdural hematoma: Continue current mechanical ventilation  further monitoring and intervention per neurosurgery  2.  History of COPD: Known CO2 retainer chronically hypercapnic with previous history of hypercapnic hypoxemic respiratory failure.  This may require prolonged pressure support prior to extubation once fully awake and cleared for extubation by neurosurgery.  Probably will be  able to start tube feedings in the morning.  Otherwise ventilator management routine critical care per protocol.  We will continue propofol at high dose for the interim.  Best practice:  Diet: N.p.o. for now Pain/Anxiety/Delirium protocol (if indicated): Propofol VAP protocol (if indicated): Yes DVT prophylaxis: SCDs GI prophylaxis: Yes Glucose control: Monitor Mobility: Bedrest Code Status: Full Family Communication: Not available Disposition: To neuro ICU  Labs   CBC: Recent Labs  Lab 05/05/2020 1925 05/12/2020 2250 05/08/2020 2259 04/27/20 0132  WBC 17.1*  --   --   --   HGB 9.6* 9.5* 9.2* 9.2*  HCT 33.0* 28.0* 27.0* 27.0*  MCV 101.5*  --   --   --   PLT 415*  --   --   --     Basic Metabolic Panel: Recent Labs  Lab 04/16/2020 1925 05/06/2020 2250 05/09/2020 2259 04/27/20 0132  NA 137 139 140 139  K 4.0 4.3 4.1 4.0  CL 99  --  101  --   CO2 30  --   --   --   GLUCOSE 217*  --  179*  --   BUN 23  --  21  --   CREATININE 0.68  --  0.70  --   CALCIUM 9.1  --   --   --   MG 1.5*  --   --   --    GFR: Estimated Creatinine Clearance: 48.7 mL/min (by C-G formula based on SCr of 0.7 mg/dL). Recent Labs  Lab 05/06/2020 1925  WBC 17.1*  LATICACIDVEN 1.3    Liver Function Tests: Recent Labs  Lab 05/05/2020 1925  AST 31  ALT 23  ALKPHOS 31*  BILITOT 0.6  PROT 7.4  ALBUMIN 3.7   No results for input(s): LIPASE, AMYLASE in the last 168 hours. Recent Labs  Lab 04/21/2020 1925  AMMONIA 39*    ABG    Component Value Date/Time   PHART 7.398 04/27/2020 0132   PCO2ART 45.9 04/27/2020 0132   PO2ART 125 (H) 04/27/2020 0132   HCO3 28.3 (H) 04/27/2020 0132   TCO2 30 04/27/2020 0132   O2SAT 99.0 04/27/2020 0132     Coagulation Profile: No results for input(s): INR, PROTIME in the last 168 hours.  Cardiac Enzymes: No results for input(s): CKTOTAL, CKMB, CKMBINDEX, TROPONINI in the last 168 hours.  HbA1C: Hgb A1c MFr Bld  Date/Time Value Ref Range Status  10/30/2016  04:44 AM 8.1 (H) 4.8 - 5.6 % Final    Comment:    (NOTE)         Pre-diabetes: 5.7 - 6.4         Diabetes: >6.4         Glycemic control for adults with diabetes: <7.0     CBG: No results for input(s): GLUCAP in the last 168 hours.  Review of Systems:   Unable to obtain  Past Medical History  She,  has a past medical history of Coronary atherosclerosis of native coronary artery, Depression, Glaucoma, Hydrocephalus ([place]), Hyperlipidemia, Nephrolithiasis, Spinal stenosis, and Type 2 diabetes mellitus ([place]).   Surgical History    Past Surgical History:  Procedure Laterality Date  . APPENDECTOMY    . BACK SURGERY    .  CARDIAC SURGERY    . CORONARY ARTERY BYPASS GRAFT  11/24/1991   SVG to OM1, SVG to RCA  . FOOT SURGERY    . IR VERTEBROPLASTY CERV/THOR BX INC UNI/BIL INC/INJECT/IMAGING  04/19/2017  . KYPHOPLASTY    . VENTRICULOPERITONEAL SHUNT       Social History   reports that she quit smoking about 26 years ago. Her smoking use included cigarettes. She started smoking about 62 years ago. She smoked 2.00 packs per day. She has never used smokeless tobacco. She reports that she does not drink alcohol or use drugs.   Family History   Her family history includes Cancer in her father; Diabetes in her brother; Heart disease in her brother; Hyperlipidemia in her daughter.   Allergies Allergies  Allergen Reactions  . Codeine Other (See Comments)    Made teeth loose  . Penicillins Rash       . Sulfa Antibiotics Rash     Home Medications  Prior to Admission medications   Medication Sig Start Date End Date Taking? Authorizing Provider  acetaminophen (TYLENOL) 500 MG tablet Take 1,000 mg by mouth in the morning and at bedtime.    Yes [provider]  albuterol (PROVENTIL) (2.5 MG/3ML) 0.083% nebulizer solution Take 3 mLs (2.5 mg total) by nebulization every 6 (six) hours as needed for wheezing or shortness of breath. ICD 10 code J44.9 05/05/2020  Yes Chesley Mires, MD    albuterol (VENTOLIN HFA) 108 (90 Base) MCG/ACT inhaler Inhale 2 puffs into the lungs every 6 (six) hours as needed for wheezing or shortness of breath.   Yes [provider]  aspirin EC 81 MG tablet Take 81 mg by mouth every morning.    Yes [provider]  bisacodyl (DULCOLAX) 10 MG suppository Place 10 mg rectally as needed for moderate constipation.   Yes [provider]  brimonidine (ALPHAGAN) 0.2 % ophthalmic solution Place 1 drop into both eyes 3 (three) times daily. 02/17/20  Yes [provider]  Calcium Carb-Cholecalciferol (CALCIUM 600+D) 600-800 MG-UNIT TABS Take 1 tablet by mouth 2 (two) times daily.   Yes [provider]  Choline Fenofibrate 135 MG capsule Take 135 mg by mouth every morning.    Yes [provider]  denosumab (PROLIA) 60 MG/ML SOSY injection Inject 60 mg into the skin every 6 (six) months.   Yes [provider]  dorzolamide-timolol (COSOPT) 22.3-6.8 MG/ML ophthalmic solution Place 1 drop into both eyes 2 (two) times daily.  10/30/14  Yes [provider]  gabapentin (NEURONTIN) 100 MG capsule Take 100 mg by mouth 3 (three) times daily.  09/29/14  Yes [provider]  lisinopril (ZESTRIL) 2.5 MG tablet Take 2.5 mg by mouth daily in the afternoon.  04/04/20  Yes [provider]  lubiprostone (AMITIZA) 24 MCG capsule Take 24 mcg by mouth 2 (two) times daily with a meal.   Yes [provider]  Milnacipran (SAVELLA) 50 MG TABS Take 50 mg by mouth 2 (two) times daily.   Yes [provider]  Multiple Vitamins-Minerals (MULTIVITAMIN WITH MINERALS) tablet Take 1 tablet by mouth in the morning. Complete Multivitamin 50+   Yes [provider]  nitroGLYCERIN (NITROSTAT) 0.4 MG SL tablet Place 1 tablet (0.4 mg total) under the tongue every 5 (five) minutes as needed for chest pain. 02/03/16  Yes Satira Sark, MD  OXYGEN Inhale 2 L into the lungs continuous.   Yes  [provider]  pantoprazole (PROTONIX) 40 MG tablet  Take 40 mg by mouth every morning.  08/31/13  Yes [provider]  pioglitazone (ACTOS) 15 MG tablet Take 15 mg by mouth in the morning.  10/03/14  Yes [provider]  polyethylene glycol (MIRALAX / GLYCOLAX) packet Take 17 g by mouth daily as needed for mild constipation or moderate constipation.    Yes [provider]  ROCKLATAN 0.02-0.005 % SOLN Place 1 drop into both eyes at bedtime. 03/19/20  Yes [provider]  rOPINIRole (REQUIP) 1 MG tablet Take 1 mg by mouth at bedtime.   Yes [provider]  rosuvastatin (CRESTOR) 40 MG tablet Take 40 mg by mouth every evening.  08/20/16  Yes [provider]  sitaGLIPtan-metformin (JANUMET) 50-1000 MG per tablet Take 1 tablet by mouth 2 (two) times daily with a meal.   Yes [provider]  VASCEPA 1 g capsule Take 2 g by mouth 2 (two) times daily. 04/16/20  Yes [provider]     Critical care time: Over 35 minutes spent bedside evaluation chart review and critical care planning

## 2020-04-27 NOTE — Anesthesia Postprocedure Evaluation (Signed)
Anesthesia Post Note  Patient: [name]  Procedure(s) Performed: CRANIOTOMY HEMATOMA EVACUATION SUBDURAL (Right Head)     Patient location during evaluation: SICU Anesthesia Type: General Level of consciousness: sedated Pain management: pain level controlled Vital Signs Assessment: post-procedure vital signs reviewed and stable Respiratory status: patient remains intubated per anesthesia plan Cardiovascular status: stable Postop Assessment: no apparent nausea or vomiting Anesthetic complications: no    Last Vitals:  Vitals:   04/27/20 0000 04/27/20 0004  BP: 132/77   Pulse: 100 (!) 101  Resp: (!) 31 (!) 28  Temp: 37.3 C   SpO2: 100% 100%    Last Pain:  Vitals:   04/27/20 0000  TempSrc: Oral                 Alik Mawson DAVID

## 2020-04-27 NOTE — Progress Notes (Signed)
Pharmacy Antibiotic Note  [name] is a 86 y.o. female admitted on 04/22/2020 with s/p SDH evacuation.  Pharmacy has been consulted for Vancomycin dosing for surgical prophylaxis. WBC 17. Renal function ok.   Plan: Vancomycin 1000 mg IV x 1 at 1000  Height: 5\' 2"  (157.5 cm) Weight: 72 kg (158 lb 11.7 oz) IBW/kg (Calculated) : 50.1  Temp (24hrs), Avg:99.2 F (37.3 C), Min:99.2 F (37.3 C), Max:99.2 F (37.3 C)  Recent Labs  Lab 04/25/2020 1925 04/20/2020 2259  WBC 17.1*  --   CREATININE 0.68 0.70  LATICACIDVEN 1.3  --     Estimated Creatinine Clearance: 48.7 mL/min (by C-G formula based on SCr of 0.7 mg/dL).    Allergies  Allergen Reactions  . Codeine Other (See Comments)    Made teeth loose  . Penicillins Rash       . Sulfa Antibiotics Rash    Narda Bonds, PharmD, BCPS Clinical Pharmacist Phone: [phone]

## 2020-04-27 NOTE — Transfer of Care (Signed)
Immediate Anesthesia Transfer of Care Note  Patient: [name]  Procedure(s) Performed: CRANIOTOMY HEMATOMA EVACUATION SUBDURAL (Right Head)  Patient Location: NICU  Anesthesia Type:General  Level of Consciousness: Patient remains intubated per anesthesia plan  Airway & Oxygen Therapy: Patient placed on Ventilator (see vital sign flow sheet for setting)  Post-op Assessment: Report given to RN and Post -op Vital signs reviewed and stable  Post vital signs: Reviewed and stable  Last Vitals:  Vitals Value Taken Time  BP 72/29 04/27/20 0004  Temp    Pulse 101 04/27/20 0004  Resp 25 04/27/20 0008  SpO2 100 % 04/27/20 0004  Vitals shown include unvalidated device data.  Last Pain: There were no vitals filed for this visit.       Complications: No apparent anesthesia complications

## 2020-04-28 ENCOUNTER — Inpatient Hospital Stay (HOSPITAL_COMMUNITY): Payer: Medicare Other

## 2020-04-28 DIAGNOSIS — J9601 Acute respiratory failure with hypoxia: Secondary | ICD-10-CM

## 2020-04-28 LAB — CBC WITH DIFFERENTIAL/PLATELET
Abs Immature Granulocytes: 0.12 10*3/uL — ABNORMAL HIGH (ref 0.00–0.07)
Basophils Absolute: 0 10*3/uL (ref 0.0–0.1)
Basophils Relative: 0 %
Eosinophils Absolute: 0.5 10*3/uL (ref 0.0–0.5)
Eosinophils Relative: 4 %
HCT: 26.4 % — ABNORMAL LOW (ref 36.0–46.0)
Hemoglobin: 8 g/dL — ABNORMAL LOW (ref 12.0–15.0)
Immature Granulocytes: 1 %
Lymphocytes Relative: 8 %
Lymphs Abs: 1 10*3/uL (ref 0.7–4.0)
MCH: 29 pg (ref 26.0–34.0)
MCHC: 30.3 g/dL (ref 30.0–36.0)
MCV: 95.7 fL (ref 80.0–100.0)
Monocytes Absolute: 1.2 10*3/uL — ABNORMAL HIGH (ref 0.1–1.0)
Monocytes Relative: 10 %
Neutro Abs: 9.4 10*3/uL — ABNORMAL HIGH (ref 1.7–7.7)
Neutrophils Relative %: 77 %
Platelets: 298 10*3/uL (ref 150–400)
RBC: 2.76 MIL/uL — ABNORMAL LOW (ref 3.87–5.11)
RDW: 14.1 % (ref 11.5–15.5)
WBC: 12.3 10*3/uL — ABNORMAL HIGH (ref 4.0–10.5)
nRBC: 0 % (ref 0.0–0.2)

## 2020-04-28 LAB — PHOSPHORUS
Phosphorus: 2.3 mg/dL — ABNORMAL LOW (ref 2.5–4.6)
Phosphorus: 2 mg/dL — ABNORMAL LOW (ref 2.5–4.6)

## 2020-04-28 LAB — HEMOGLOBIN A1C
Hgb A1c MFr Bld: 6.5 % — ABNORMAL HIGH (ref 4.8–5.6)
Mean Plasma Glucose: 139.85 mg/dL

## 2020-04-28 LAB — GLUCOSE, CAPILLARY
Glucose-Capillary: 138 mg/dL — ABNORMAL HIGH (ref 70–99)
Glucose-Capillary: 153 mg/dL — ABNORMAL HIGH (ref 70–99)
Glucose-Capillary: 156 mg/dL — ABNORMAL HIGH (ref 70–99)
Glucose-Capillary: 158 mg/dL — ABNORMAL HIGH (ref 70–99)
Glucose-Capillary: 193 mg/dL — ABNORMAL HIGH (ref 70–99)
Glucose-Capillary: 218 mg/dL — ABNORMAL HIGH (ref 70–99)

## 2020-04-28 LAB — BASIC METABOLIC PANEL
Anion gap: 12 (ref 5–15)
BUN: 17 mg/dL (ref 8–23)
CO2: 26 mmol/L (ref 22–32)
Calcium: 8.7 mg/dL — ABNORMAL LOW (ref 8.9–10.3)
Chloride: 101 mmol/L (ref 98–111)
Creatinine, Ser: 0.73 mg/dL (ref 0.44–1.00)
GFR calc Af Amer: >60 mL/min (ref >60)
GFR calc non Af Amer: >60 mL/min (ref >60)
Glucose, Bld: 159 mg/dL — ABNORMAL HIGH (ref 70–99)
Potassium: 3.3 mmol/L — ABNORMAL LOW (ref 3.5–5.1)
Sodium: 139 mmol/L (ref 135–145)

## 2020-04-28 LAB — MAGNESIUM
Magnesium: 1.7 mg/dL (ref 1.7–2.4)
Magnesium: 2.3 mg/dL (ref 1.7–2.4)

## 2020-04-28 LAB — TRIGLYCERIDES: Triglycerides: 308 mg/dL — ABNORMAL HIGH (ref <150)

## 2020-04-28 MED ORDER — VITAL AF 1.2 CAL PO LIQD
1000.0000 mL | ORAL | Status: DC
  Administered 2020-04-28 – 2020-05-02 (×3): 1000 mL

## 2020-04-28 MED ORDER — FENTANYL CITRATE (PF) 100 MCG/2ML IJ SOLN
25.0000 ug | INTRAMUSCULAR | Status: DC | PRN
  Administered 2020-04-29 – 2020-05-01 (×2): 25 ug via INTRAVENOUS
  Filled 2020-04-28 (×2): qty 2

## 2020-04-28 MED ORDER — POTASSIUM CHLORIDE 20 MEQ/15ML (10%) PO SOLN
30.0000 meq | ORAL | Status: AC
  Administered 2020-04-28 (×2): 30 meq
  Filled 2020-04-28 (×2): qty 30

## 2020-04-28 MED ORDER — PROMETHAZINE HCL 25 MG PO TABS: 12.5000 mg | ORAL_TABLET | ORAL | Status: DC | PRN

## 2020-04-28 MED ORDER — BISACODYL 10 MG RE SUPP: 10.0000 mg | Freq: Every day | RECTAL | Status: DC | PRN

## 2020-04-28 MED ORDER — MAGNESIUM SULFATE 2 GM/50ML IV SOLN
2.0000 g | Freq: Once | INTRAVENOUS | Status: AC
  Administered 2020-04-28: 2 g via INTRAVENOUS
  Filled 2020-04-28: qty 50

## 2020-04-28 MED ORDER — LATANOPROST 0.005 % OP SOLN
1.0000 [drp] | Freq: Every day | OPHTHALMIC | Status: DC
  Administered 2020-04-28 – 2020-05-01 (×4): 1 [drp] via OPHTHALMIC
  Filled 2020-04-28: qty 2.5

## 2020-04-28 MED ORDER — SODIUM CHLORIDE 0.9 % IV SOLN
2.0000 g | INTRAVENOUS | Status: DC
  Administered 2020-04-28 – 2020-05-01 (×4): 2 g via INTRAVENOUS
  Filled 2020-04-28 (×4): qty 2
  Filled 2020-04-28: qty 20

## 2020-04-28 MED ORDER — BRIMONIDINE TARTRATE 0.2 % OP SOLN
1.0000 [drp] | Freq: Three times a day (TID) | OPHTHALMIC | Status: DC
  Administered 2020-04-28 – 2020-05-01 (×12): 1 [drp] via OPHTHALMIC
  Filled 2020-04-28: qty 5

## 2020-04-28 MED ORDER — PANTOPRAZOLE SODIUM 40 MG PO TBEC: 40.0000 mg | DELAYED_RELEASE_TABLET | Freq: Every day | ORAL | Status: DC

## 2020-04-28 MED ORDER — PANTOPRAZOLE SODIUM 40 MG PO PACK
40.0000 mg | PACK | Freq: Every day | ORAL | Status: DC
  Administered 2020-04-28 – 2020-05-01 (×4): 40 mg
  Filled 2020-04-28 (×4): qty 20

## 2020-04-28 MED ORDER — POTASSIUM & SODIUM PHOSPHATES 280-160-250 MG PO PACK
1.0000 | PACK | ORAL | Status: AC
  Administered 2020-04-28 (×3): 1
  Filled 2020-04-28 (×3): qty 1

## 2020-04-28 MED ORDER — POLYETHYLENE GLYCOL 3350 17 G PO PACK
17.0000 g | PACK | Freq: Every day | ORAL | Status: DC
  Administered 2020-04-29 – 2020-05-01 (×3): 17 g
  Filled 2020-04-28 (×3): qty 1

## 2020-04-28 NOTE — Progress Notes (Signed)
Sputum culture collected, sent to lab.  

## 2020-04-28 NOTE — Progress Notes (Addendum)
  NEUROSURGERY PROGRESS NOTE   No issues overnight.  Remains intubated, no sedation running  EXAM:  BP 119/65   Pulse (!) 108   Temp (!) 100.6 F (38.1 C) (Axillary)   Resp (!) 28   Ht 5\' 2"  (1.575 m)   Wt 72 kg   SpO2 95%   BMI 29.03 kg/m   Intubated Opens eyes to voice Follows commands squeezing with right hand and wiggling right toes Not following commands with LUE/LLE Bandage in place, dry and clean JP drain in place.  IMPRESSION/PLAN 86 y.o. female POD #2 s/p right crani for SDH, clinically improved from preop. CT shows good evacuation. - continue supportive care, SBP <160   ============================================================================================================== I have seen and examined [name] and agree with the exam, impression, and plan as documented in the note by Ferne Reus, PA-C.  POD# 2 s/p crani for acute SDH, appears to be improving from neurological standpoint, intermittently FC on right.  - Will cont supportive care - vent mgmt per PCCM - Will d/c JP  tomorrow [name], MD [facility]

## 2020-04-28 NOTE — Progress Notes (Signed)
Initial Nutrition Assessment  DOCUMENTATION CODES:   Not applicable  INTERVENTION:   Initiate tube feeding via OG tube: Vital AF 1.2 at 55 ml/h (1320 ml per day)  Provides 1584 kcal, 99 gm protein, 1070 ml free water daily   NUTRITION DIAGNOSIS:   Inadequate oral intake related to   as evidenced by NPO status.  GOAL:   Patient will meet greater than or equal to 90% of their needs  MONITOR:   TF tolerance  REASON FOR ASSESSMENT:   Consult, Ventilator Enteral/tube feeding initiation and management  ASSESSMENT:   Pt with PMH of COPD followed by Halford Chessman, CAD s/p CABG, depression, HLD, and DM who was admitted s/p fall with large R SDH with midline shift. Noted pre-existing shunt for hydrocephalus.   5/11 s/p craniotomy   No family present.   Patient is currently intubated on ventilator support MV: 9.6 L/min Temp (24hrs), Avg:99.7 F (37.6 C), Min:98.4 F (36.9 C), Max:100.7 F (38.2 C)  Medications reviewed and include: colace, miralax, senokot KCl/phos/magnesium supplementation  Labs reviewed: K+ 3.3 (L), PO4: 2.3 (L) TG: 308 JP drain: 10  OG tip gastric per xray   NUTRITION - FOCUSED PHYSICAL EXAM:    Most Recent Value  Orbital Region  No depletion  Upper Arm Region  No depletion  Thoracic and Lumbar Region  No depletion  Buccal Region  No depletion  Temple Region  No depletion  Clavicle Bone Region  No depletion  Clavicle and Acromion Bone Region  No depletion  Scapular Bone Region  No depletion  Dorsal Hand  No depletion  Patellar Region  Mild depletion  Anterior Thigh Region  Moderate depletion  Posterior Calf Region  No depletion  Edema (RD Assessment)  None  Hair  Reviewed  Eyes  Unable to assess  Mouth  Unable to assess  Skin  Reviewed  Nails  Reviewed       Diet Order:   Diet Order    None      EDUCATION NEEDS:   No education needs have been identified at this time  Skin:  Skin Assessment: Reviewed RN Assessment  Last BM:   unknown  Height:   Ht Readings from Last 1 Encounters:  05/04/2020 5\' 2"  (1.575 m)    Weight:   Wt Readings from Last 1 Encounters:  05/05/2020 72 kg    Ideal Body Weight:  50 kg  BMI:  Body mass index is 29.03 kg/m.  Estimated Nutritional Needs:   Kcal:  [id number]  Protein:  90-110 grams  Fluid:  > 1.5 L/day  Lockie Pares., RD, LDN, CNSC See AMiON for contact information

## 2020-04-29 ENCOUNTER — Institutional Professional Consult (permissible substitution): Payer: Medicare Other | Admitting: Pulmonary Disease

## 2020-04-29 ENCOUNTER — Inpatient Hospital Stay (HOSPITAL_COMMUNITY): Payer: Medicare Other

## 2020-04-29 LAB — AMMONIA: Ammonia: 36 umol/L — ABNORMAL HIGH (ref 9–35)

## 2020-04-29 LAB — CBC
HCT: 26.7 % — ABNORMAL LOW (ref 36.0–46.0)
Hemoglobin: 8.1 g/dL — ABNORMAL LOW (ref 12.0–15.0)
MCH: 28.9 pg (ref 26.0–34.0)
MCHC: 30.3 g/dL (ref 30.0–36.0)
MCV: 95.4 fL (ref 80.0–100.0)
Platelets: 318 10*3/uL (ref 150–400)
RBC: 2.8 MIL/uL — ABNORMAL LOW (ref 3.87–5.11)
RDW: 14.2 % (ref 11.5–15.5)
WBC: 11.7 10*3/uL — ABNORMAL HIGH (ref 4.0–10.5)
nRBC: 0 % (ref 0.0–0.2)

## 2020-04-29 LAB — GLUCOSE, CAPILLARY
Glucose-Capillary: 188 mg/dL — ABNORMAL HIGH (ref 70–99)
Glucose-Capillary: 204 mg/dL — ABNORMAL HIGH (ref 70–99)
Glucose-Capillary: 225 mg/dL — ABNORMAL HIGH (ref 70–99)
Glucose-Capillary: 267 mg/dL — ABNORMAL HIGH (ref 70–99)
Glucose-Capillary: 267 mg/dL — ABNORMAL HIGH (ref 70–99)
Glucose-Capillary: 267 mg/dL — ABNORMAL HIGH (ref 70–99)
Glucose-Capillary: 275 mg/dL — ABNORMAL HIGH (ref 70–99)

## 2020-04-29 LAB — COMPREHENSIVE METABOLIC PANEL
ALT: 111 U/L — ABNORMAL HIGH (ref 0–44)
AST: 130 U/L — ABNORMAL HIGH (ref 15–41)
Albumin: 2.6 g/dL — ABNORMAL LOW (ref 3.5–5.0)
Alkaline Phosphatase: 41 U/L (ref 38–126)
Anion gap: 9 (ref 5–15)
BUN: 24 mg/dL — ABNORMAL HIGH (ref 8–23)
CO2: 23 mmol/L (ref 22–32)
Calcium: 8.8 mg/dL — ABNORMAL LOW (ref 8.9–10.3)
Chloride: 107 mmol/L (ref 98–111)
Creatinine, Ser: 0.73 mg/dL (ref 0.44–1.00)
GFR calc Af Amer: >60 mL/min (ref >60)
GFR calc non Af Amer: >60 mL/min (ref >60)
Glucose, Bld: 286 mg/dL — ABNORMAL HIGH (ref 70–99)
Potassium: 3.9 mmol/L (ref 3.5–5.1)
Sodium: 139 mmol/L (ref 135–145)
Total Bilirubin: 0.6 mg/dL (ref 0.3–1.2)
Total Protein: 6.6 g/dL (ref 6.5–8.1)

## 2020-04-29 LAB — MAGNESIUM
Magnesium: 2.3 mg/dL (ref 1.7–2.4)
Magnesium: 2.1 mg/dL (ref 1.7–2.4)

## 2020-04-29 LAB — PHOSPHORUS
Phosphorus: 1.8 mg/dL — ABNORMAL LOW (ref 2.5–4.6)
Phosphorus: 2.3 mg/dL — ABNORMAL LOW (ref 2.5–4.6)

## 2020-04-29 LAB — TRIGLYCERIDES: Triglycerides: 411 mg/dL — ABNORMAL HIGH (ref <150)

## 2020-04-29 MED ORDER — POTASSIUM & SODIUM PHOSPHATES 280-160-250 MG PO PACK: 1.0000 | PACK | ORAL | Status: DC

## 2020-04-29 MED ORDER — LEVETIRACETAM 100 MG/ML PO SOLN
500.0000 mg | Freq: Two times a day (BID) | ORAL | Status: DC
  Administered 2020-04-29 – 2020-05-01 (×6): 500 mg
  Filled 2020-04-29 (×6): qty 5

## 2020-04-29 MED ORDER — INSULIN ASPART 100 UNIT/ML ~~LOC~~ SOLN: 2.0000 [IU] | SUBCUTANEOUS | Status: DC

## 2020-04-29 MED ORDER — METOPROLOL TARTRATE 25 MG/10 ML ORAL SUSPENSION
12.5000 mg | Freq: Two times a day (BID) | ORAL | Status: DC
  Administered 2020-04-29 – 2020-05-01 (×5): 12.5 mg
  Filled 2020-04-29 (×5): qty 10

## 2020-04-29 MED ORDER — POTASSIUM & SODIUM PHOSPHATES 280-160-250 MG PO PACK
1.0000 | PACK | ORAL | Status: AC
  Administered 2020-04-29 – 2020-04-30 (×6): 1
  Filled 2020-04-29 (×6): qty 1

## 2020-04-29 MED ORDER — INSULIN ASPART 100 UNIT/ML ~~LOC~~ SOLN: 3.0000 [IU] | SUBCUTANEOUS | Status: DC

## 2020-04-29 MED ORDER — INSULIN ASPART 100 UNIT/ML ~~LOC~~ SOLN
0.0000 [IU] | SUBCUTANEOUS | Status: DC
  Administered 2020-04-29: 11 [IU] via SUBCUTANEOUS
  Administered 2020-04-29: 4 [IU] via SUBCUTANEOUS
  Administered 2020-04-29: 7 [IU] via SUBCUTANEOUS
  Administered 2020-04-30: 4 [IU] via SUBCUTANEOUS
  Administered 2020-04-30: 3 [IU] via SUBCUTANEOUS
  Administered 2020-04-30 (×3): 7 [IU] via SUBCUTANEOUS
  Administered 2020-04-30 – 2020-05-01 (×5): 4 [IU] via SUBCUTANEOUS
  Administered 2020-05-01: 7 [IU] via SUBCUTANEOUS
  Administered 2020-05-01 – 2020-05-02 (×2): 4 [IU] via SUBCUTANEOUS
  Administered 2020-05-02: 7 [IU] via SUBCUTANEOUS

## 2020-04-29 NOTE — Progress Notes (Signed)
NAME:  [name], MRN:  [id number], DOB:  1933/05/27, LOS: 3 ADMISSION DATE:  04/16/2020, CONSULTATION DATE: 04/27/2020 REFERRING MD: Kathyrn Sheriff, CHIEF COMPLAINT: Status post evacuation subdural hematoma  Brief History   86 year old admitted for evacuation of subdural hematoma  History of present illness   Patient is a 86 year old female with history of COPD hypoxemic and hypercapnic respiratory failure who has had a series of falls I believe to yesterday brought by EMT to the emergency room.  Patient had declining mental status in the emergency room requiring intubation.  ABG did show a PCO2 of 75 and CT scan of the head revealed a large right-sided subdural hematoma with midline shift pre-existing shunt for hydrocephalus. Patient was taken to the operating room for right frontoparietal craniotomy with evacuation of subdural hematoma.  She is seen by me in the neuro ICU postoperatively.  She is on high-dose propofol not responsive although she does have brisk corneals and spontaneous eye movement.  She currently is ventilated. Patient is seen regularly by Dr. Halford Chessman and as a matter fact was seen yesterday morning prior to these falls. Past Medical History   . Coronary atherosclerosis of native coronary artery    Multivessel status post CABG  . Depression   . Glaucoma   . Hydrocephalus ([place])   . Hyperlipidemia   . Nephrolithiasis   . Spinal stenosis   . Type 2 diabetes mellitus ([place])    . Acute and chronic respiratory failure with hypercapnia ([place]) 07/25/2018  . Acute lower UTI 07/25/2018  . Acute metabolic encephalopathy [id number]  . Abnormality of gait and mobility 04/08/2018  . Hydrocephalus ([place]) 04/08/2018  . Acute respiratory failure with hypercapnia ([place]) 10/29/2016  . Acute diastolic heart failure ([place]) 10/29/2016  . Essential hypertension 10/29/2016  . Normocytic anemia 10/29/2016  . Diabetes mellitus with neuropathy ([place]) 10/29/2016  . COPD with exacerbation  ([place]) 10/29/2016  . CAP (community acquired pneumonia) 10/26/2016  . Body mass index (BMI) of 30.0-30.9 in adult 11/23/2015  . Coronary atherosclerosis of native coronary artery 09/11/2013  . Mixed hyperlipidemia 09/11/2013  . Carotid artery occlusion without infarction 09/11/2013  . Difficulty walking 06/24/2013     Significant Hospital Events   Post craniotomy 05/10/2020  Consults:  Neurosurgery and PCCM  Procedures:  Craniotomy 5/11  Significant Diagnostic Tests:  CTH and ct cspine 5/11: CT of the head: Large bilateral acute on chronic subdural hematomas right greater than left with midline shift from right to left as described.  Additionally there are findings consistent with parenchymal hemorrhage within the right thalamus with extension into the lateral ventricles and third ventricle. Some casting is noted of the clot suggesting a slightly more subacute process.  CT of the cervical spine: Mild degenerative change without acute abnormality. [place] 5/12: 1. Interval right frontal craniotomy with expected postsurgical changes and interval decrease in size of the right cerebral convexity subdural hematoma. Unchanged 7 mm right to left midline shift. Decreased but persistent effacement of the right lateral and third ventricles. 2. Unchanged small intraparenchymal hematoma in the superior right thalamus. 3. Slight interval decrease in size of the small left cerebral convexity subdural hematoma. 4. Unchanged right-sided approach ventriculostomy and small volume intraventricular hemorrhage.  Micro Data:  sars2 5/11: neg Blood 5/12: resp 5/12:  Antimicrobials:  vanc 5/11->5/12 Ceftriaxone 5/13->  Interim history/subjective:  5/14: tmax down to 100.7, wbc stable today as well as cxr. lft increased, unclear etiology 5/13: tmax 102.1 over past 24hr. No acute events otherwise. sbp goal per  neurosx <141mmHg. Labs not drawn this am... will reorder Objective   Blood  pressure 127/70, pulse (!) 106, temperature 99.5 F (37.5 C), temperature source Axillary, resp. rate (!) 29, height 5\' 2"  (1.575 m), weight 72.1 kg, SpO2 96 %.    Vent Mode: PRVC FiO2 (%):  [40 %] 40 % Set Rate:  [28 bmp] 28 bmp Vt Set:  [400 mL] 400 mL PEEP:  [5 cmH20] 5 cmH20 Pressure Support:  [10 [id number] cmH20] 10 cmH20 Plateau Pressure:  [20 [id number] cmH20] 21 cmH20   Intake/Output Summary (Last 24 hours) at 04/29/2020 0853 Last data filed at 04/29/2020 0800 Gross per 24 hour  Intake 1248.49 ml  Output 700 ml  Net 548.49 ml   Filed Weights   05/02/2020 1842 04/29/20 0500  Weight: 72 kg 72.1 kg    Examination: General: White female unresponsive this am. ett in place HENT: Postsurgical bandage otherwise unremarkable, drain removed Lungs: Diminished but clear in both lung fields Cardiovascular: Regular rate and rhythm Abdomen: Benign bowel sounds non tender, non distended Extremities: no withdraw to painful stim this am which is change from yesterday. Not opening eyes this am as well  Neuro: As above GU: deferred  Resolved Hospital Problem list   NA  Assessment & Plan:  1.  Post craniotomy for subdural hematoma: Continue current mechanical ventilation further monitoring and intervention per neurosurgery -fentanyl prn  -off continuous sedation  2.  History of COPD:  -no acute exacerbation -cont bronchodilators  Acute hypoxic resp failure:  Gn/gp cap -intubated for airway protection -titrate vent -monitoring mental status for ability to extubate -sbt ok as long as tolerates, but no plans for extubation 2/2 mental status -ceftriaxone  Glaucoma:  -home eye drops  Dm2:  -a1c: 6.5 -monitor bs, elevated with tf on board -starting ssi, resistant -hold home oral agents  Hypophos:  -replace  Transaminitis:  -trend -? 2/2 keppra -recheck ammonia  Best practice:  Diet: tf  Pain/Anxiety/Delirium protocol (if indicated): minimize VAP protocol (if indicated):  Yes DVT prophylaxis: SCDs GI prophylaxis: Yes Glucose control: ssi Mobility: Bedrest Code Status: Full Family Communication: daughter at bedside Disposition: ICU  Labs   CBC: Recent Labs  Lab 04/23/2020 1925 04/17/2020 1925 05/01/2020 2250 04/22/2020 2259 04/27/20 0132 04/28/20 0953 04/29/20 0506  WBC 17.1*  --   --   --   --  12.3* 11.7*  NEUTROABS  --   --   --   --   --  9.4*  --   HGB 9.6*   < > 9.5* 9.2* 9.2* 8.0* 8.1*  HCT 33.0*   < > 28.0* 27.0* 27.0* 26.4* 26.7*  MCV 101.5*  --   --   --   --  95.7 95.4  PLT 415*  --   --   --   --  298 318   < > = values in this interval not displayed.    Basic Metabolic Panel: Recent Labs  Lab 04/25/2020 1925 05/15/2020 1925 05/02/2020 2250 05/12/2020 2259 04/27/20 0132 04/28/20 0953 04/28/20 1238 04/28/20 1645 04/29/20 0506  NA 137   < > 139 140 139 139  --   --  139  K 4.0   < > 4.3 4.1 4.0 3.3*  --   --  3.9  CL 99  --   --  101  --  101  --   --  107  CO2 30  --   --   --   --  26  --   --  23  GLUCOSE 217*  --   --  179*  --  159*  --   --  286*  BUN 23  --   --  21  --  17  --   --  24*  CREATININE 0.68  --   --  0.70  --  0.73  --   --  0.73  CALCIUM 9.1  --   --   --   --  8.7*  --   --  8.8*  MG 1.5*  --   --   --   --   --  1.7 2.3 2.3  PHOS  --   --   --   --   --   --  2.3* 2.0* 1.8*   < > = values in this interval not displayed.   GFR: Estimated Creatinine Clearance: 48.7 mL/min (by C-G formula based on SCr of 0.73 mg/dL). Recent Labs  Lab 05/10/2020 1925 04/28/20 0953 04/29/20 0506  WBC 17.1* 12.3* 11.7*  LATICACIDVEN 1.3  --   --     Liver Function Tests: Recent Labs  Lab 05/14/2020 1925 04/29/20 0506  AST 31 130*  ALT 23 111*  ALKPHOS 31* 41  BILITOT 0.6 0.6  PROT 7.4 6.6  ALBUMIN 3.7 2.6*   No results for input(s): LIPASE, AMYLASE in the last 168 hours. Recent Labs  Lab 04/18/2020 1925  AMMONIA 39*    ABG    Component Value Date/Time   PHART 7.398 04/27/2020 0132   PCO2ART 45.9 04/27/2020  0132   PO2ART 125 (H) 04/27/2020 0132   HCO3 28.3 (H) 04/27/2020 0132   TCO2 30 04/27/2020 0132   O2SAT 99.0 04/27/2020 0132     Coagulation Profile: No results for input(s): INR, PROTIME in the last 168 hours.  Cardiac Enzymes: No results for input(s): CKTOTAL, CKMB, CKMBINDEX, TROPONINI in the last 168 hours.  HbA1C: Hgb A1c MFr Bld  Date/Time Value Ref Range Status  04/28/2020 09:53 AM 6.5 (H) 4.8 - 5.6 % Final    Comment:    (NOTE) Pre diabetes:          5.7%-6.4% Diabetes:              >6.4% Glycemic control for   <7.0% adults with diabetes   10/30/2016 04:44 AM 8.1 (H) 4.8 - 5.6 % Final    Comment:    (NOTE)         Pre-diabetes: 5.7 - 6.4         Diabetes: >6.4         Glycemic control for adults with diabetes: <7.0     CBG: Recent Labs  Lab 04/28/20 1549 04/28/20 1935 04/28/20 2340 04/29/20 0333 04/29/20 0819  GLUCAP 158* 193* 218* 267* 275*     Critical care time: The patient is critically ill with multiple organ systems failure and requires high complexity decision making for assessment and support, frequent evaluation and titration of therapies, application of advanced monitoring technologies and extensive interpretation of multiple databases.  Critical care time 39 mins. This represents my time independent of the NPs time taking care of the pt. This is excluding procedures.    [facility] 04/29/2020, 8:53 AM

## 2020-04-29 NOTE — Progress Notes (Signed)
  NEUROSURGERY PROGRESS NOTE   No issues overnight.  Intubated, no sedation  EXAM:  BP (!) 146/84   Pulse (!) 116   Temp 99.5 F (37.5 C) (Axillary)   Resp (!) 34   Ht 5\' 2"  (1.575 m)   Wt 72.1 kg   SpO2 93%   BMI 29.07 kg/m   Intubated Opens eyes to voice Follows commands squeezing with right hand and wiggling right toes Not following commands with LUE/LLE. Did flex LLE to central pain, no movement LUE to pain Bandage in place, dry and clean JP drain in place.  IMPRESSION/PLAN 86 y.o. female POD #3 s/p right crani for SDH, clinically improved from preop.  - JP drain removed today - continue supportive care, SBP <160 - appreciate PCCM assistance

## 2020-04-30 DIAGNOSIS — S065X9A Traumatic subdural hemorrhage with loss of consciousness of unspecified duration, initial encounter: Principal | ICD-10-CM

## 2020-04-30 DIAGNOSIS — J9622 Acute and chronic respiratory failure with hypercapnia: Secondary | ICD-10-CM

## 2020-04-30 DIAGNOSIS — J9621 Acute and chronic respiratory failure with hypoxia: Secondary | ICD-10-CM

## 2020-04-30 LAB — COMPREHENSIVE METABOLIC PANEL
ALT: 187 U/L — ABNORMAL HIGH (ref 0–44)
AST: 258 U/L — ABNORMAL HIGH (ref 15–41)
Albumin: 2.6 g/dL — ABNORMAL LOW (ref 3.5–5.0)
Alkaline Phosphatase: 76 U/L (ref 38–126)
Anion gap: 12 (ref 5–15)
BUN: 25 mg/dL — ABNORMAL HIGH (ref 8–23)
CO2: 23 mmol/L (ref 22–32)
Calcium: 8.7 mg/dL — ABNORMAL LOW (ref 8.9–10.3)
Chloride: 104 mmol/L (ref 98–111)
Creatinine, Ser: 0.58 mg/dL (ref 0.44–1.00)
GFR calc Af Amer: >60 mL/min (ref >60)
GFR calc non Af Amer: >60 mL/min (ref >60)
Glucose, Bld: 222 mg/dL — ABNORMAL HIGH (ref 70–99)
Potassium: 3.8 mmol/L (ref 3.5–5.1)
Sodium: 139 mmol/L (ref 135–145)
Total Bilirubin: 0.6 mg/dL (ref 0.3–1.2)
Total Protein: 6.8 g/dL (ref 6.5–8.1)

## 2020-04-30 LAB — CBC
HCT: 27.1 % — ABNORMAL LOW (ref 36.0–46.0)
Hemoglobin: 8.3 g/dL — ABNORMAL LOW (ref 12.0–15.0)
MCH: 28.6 pg (ref 26.0–34.0)
MCHC: 30.6 g/dL (ref 30.0–36.0)
MCV: 93.4 fL (ref 80.0–100.0)
Platelets: 339 10*3/uL (ref 150–400)
RBC: 2.9 MIL/uL — ABNORMAL LOW (ref 3.87–5.11)
RDW: 14.3 % (ref 11.5–15.5)
WBC: 12.4 10*3/uL — ABNORMAL HIGH (ref 4.0–10.5)
nRBC: 0.2 % (ref 0.0–0.2)

## 2020-04-30 LAB — GLUCOSE, CAPILLARY
Glucose-Capillary: 196 mg/dL — ABNORMAL HIGH (ref 70–99)
Glucose-Capillary: 208 mg/dL — ABNORMAL HIGH (ref 70–99)
Glucose-Capillary: 214 mg/dL — ABNORMAL HIGH (ref 70–99)
Glucose-Capillary: 166 mg/dL — ABNORMAL HIGH (ref 70–99)
Glucose-Capillary: 136 mg/dL — ABNORMAL HIGH (ref 70–99)
Glucose-Capillary: 170 mg/dL — ABNORMAL HIGH (ref 70–99)

## 2020-04-30 LAB — CULTURE, RESPIRATORY W GRAM STAIN: Culture: NORMAL

## 2020-04-30 LAB — MAGNESIUM: Magnesium: 2 mg/dL (ref 1.7–2.4)

## 2020-04-30 LAB — PHOSPHORUS: Phosphorus: 2.6 mg/dL (ref 2.5–4.6)

## 2020-04-30 MED ORDER — ALBUTEROL SULFATE (2.5 MG/3ML) 0.083% IN NEBU: 2.5000 mg | INHALATION_SOLUTION | RESPIRATORY_TRACT | Status: DC | PRN

## 2020-04-30 MED ORDER — ROSUVASTATIN CALCIUM 20 MG PO TABS: 40.0000 mg | ORAL_TABLET | Freq: Every evening | ORAL | Status: DC

## 2020-04-30 MED ORDER — IPRATROPIUM-ALBUTEROL 0.5-2.5 (3) MG/3ML IN SOLN
3.0000 mL | Freq: Four times a day (QID) | RESPIRATORY_TRACT | Status: DC
  Administered 2020-04-30 – 2020-05-01 (×3): 3 mL via RESPIRATORY_TRACT
  Filled 2020-04-30 (×3): qty 3

## 2020-04-30 MED ORDER — ROSUVASTATIN CALCIUM 20 MG PO TABS
40.0000 mg | ORAL_TABLET | Freq: Every evening | ORAL | Status: DC
  Administered 2020-04-30: 40 mg
  Filled 2020-04-30: qty 2

## 2020-04-30 MED ORDER — BUDESONIDE 0.5 MG/2ML IN SUSP
0.5000 mg | Freq: Two times a day (BID) | RESPIRATORY_TRACT | Status: DC
  Administered 2020-04-30 – 2020-05-02 (×4): 0.5 mg via RESPIRATORY_TRACT
  Filled 2020-04-30 (×4): qty 2

## 2020-04-30 NOTE — Progress Notes (Signed)
Patient ID: [name], female   DOB: [birth year], 86 y.o.   MRN: [id number] Remains ventilated.  Flexes on the right with no movement on the left.  Somnolent.  Will not open eyes for me this morning.  We will continue supportive care.

## 2020-04-30 NOTE — Progress Notes (Signed)
NAME:  [name], MRN:  [id number], DOB:  1933/03/12, LOS: 4 ADMISSION DATE:  05/14/2020, CONSULTATION DATE: 04/27/2020 REFERRING MD: Kathyrn Sheriff, CHIEF COMPLAINT: Status post evacuation subdural hematoma  Brief History   86 yo female presented with AMS after falling at home.  Found to have large Rt SDH with midline shift.  Had Rt frontoparietal craniotomy and remained on vent post op.  Past Medical History  CAD s/p CABG, Depression, Glaucoma, Hydrocephalus s/p VP shunt, HLD, Nephrolithiasis, Spinal stenosis, DM  Significant Hospital Events   5/11 craniotomy 5/13 fever  Consults:    Procedures:  ETT 5/11 >>  Significant Diagnostic Tests:  CT head 5/11 >> large b/l acute on chronic SDH Rt > Lt with Rt to Lt shift, hemorrhage Rt thalamus, extension into lateral and third ventricle  Micro Data:  SARS CoV2 PCR 5/11 >> negative Blood 5/12 >>  Sputum 5/12 >> oral flora  Antimicrobials:  Vancomycin 5/11 >> 5/12 Ceftriaxone 5/13 >>   Interim history/subjective:  Remains on vent.  Not on sedation.  Objective   Blood pressure (!) 141/73, pulse 100, temperature 100 F (37.8 C), temperature source Oral, resp. rate (!) 29, height 5\' 2"  (1.575 m), weight 72.1 kg, SpO2 96 %.    Vent Mode: PRVC FiO2 (%):  [40 %] 40 % Set Rate:  [28 bmp] 28 bmp Vt Set:  [400 mL] 400 mL PEEP:  [5 cmH20] 5 cmH20 Pressure Support:  [16 cmH20] 16 cmH20 Plateau Pressure:  [20 cmH20-21 cmH20] 21 cmH20   Intake/Output Summary (Last 24 hours) at 04/30/2020 1129 Last data filed at 04/30/2020 1000 Gross per 24 hour  Intake 1650 ml  Output 1610 ml  Net 40 ml   Filed Weights   04/25/2020 1842 04/29/20 0500  Weight: 72 kg 72.1 kg    Examination:  General - unresponsive Eyes - pupils reactive ENT - ETT in place Cardiac - regular rate/rhythm, no murmur Chest - b/l expiratory wheezing Abdomen - soft, non tender, + bowel sounds Extremities - no cyanosis, clubbing, or edema Skin - no rashes Neuro  - not following commands   Resolved Hospital Problem list   NA  Assessment & Plan:   Acute on chronic hypoxic/hypercapnic respiratory failure with compromised airway. COPD with emphysema and chronic bronchitis. - full vent support - goal SpO2 90 to 95% - f/u CXR - scheduled duoneb and pulmicort - prn albuterol  Fever with concern for bronchitis. - day 3 of rocephin  Acute on chronic SDH s/p craniotomy. Hx of hydrocephalus s/p VP shunt. - post op care per neurosurgery - continue keppra  Hx of glaucoma. - continue eye drops  DM type 2 poorly controlled with hyperglycemia. - SSI  Sinus tachycardia. Hx of CAD s/p CABG, HTN, HLD. - hold ASA in setting of SDH - continue lopressor, crestor - hold outpt lisinopril  Anemia of chronic disease. - f/u CBC - transfuse for Hb < 7 or significant bleeding  Best practice:  Diet: tube feeds DVT prophylaxis: SCDs GI prophylaxis: protonix Mobility: Bedrest Code Status: Full Disposition: ICU  Labs:   CMP Latest Ref Rng & Units 04/30/2020 04/29/2020 04/28/2020  Glucose 70 - 99 mg/dL 222(H) 286(H) 159(H)  BUN 8 - 23 mg/dL 25(H) 24(H) 17  Creatinine 0.44 - 1.00 mg/dL 0.58 0.73 0.73  Sodium 135 - 145 mmol/L 139 139 139  Potassium 3.5 - 5.1 mmol/L 3.8 3.9 3.3(L)  Chloride 98 - 111 mmol/L 104 107 101  CO2 22 - 32 mmol/L 23 23  26  Calcium 8.9 - 10.3 mg/dL 8.7(L) 8.8(L) 8.7(L)  Total Protein 6.5 - 8.1 g/dL 6.8 6.6 -  Total Bilirubin 0.3 - 1.2 mg/dL 0.6 0.6 -  Alkaline Phos 38 - 126 U/L 76 41 -  AST 15 - 41 U/L 258(H) 130(H) -  ALT 0 - 44 U/L 187(H) 111(H) -    CBC Latest Ref Rng & Units 04/30/2020 04/29/2020 04/28/2020  WBC 4.0 - 10.5 K/uL 12.4(H) 11.7(H) 12.3(H)  Hemoglobin 12.0 - 15.0 g/dL 8.3(L) 8.1(L) 8.0(L)  Hematocrit 36.0 - 46.0 % 27.1(L) 26.7(L) 26.4(L)  Platelets 150 - 400 K/uL 339 318 298    ABG    Component Value Date/Time   PHART 7.398 04/27/2020 0132   PCO2ART 45.9 04/27/2020 0132   PO2ART 125 (H) 04/27/2020  0132   HCO3 28.3 (H) 04/27/2020 0132   TCO2 30 04/27/2020 0132   O2SAT 99.0 04/27/2020 0132    CBG (last 3)  Recent Labs    04/29/20 2341 04/30/20 0314 04/30/20 0810  GLUCAP 225* 196* 208*    Critical care time: 38 minutes     Chesley Mires, MD [place] Pager - [phone] 04/30/2020, 11:43 AM

## 2020-05-01 ENCOUNTER — Inpatient Hospital Stay (HOSPITAL_COMMUNITY): Payer: Medicare Other

## 2020-05-01 LAB — GLUCOSE, CAPILLARY
Glucose-Capillary: 178 mg/dL — ABNORMAL HIGH (ref 70–99)
Glucose-Capillary: 184 mg/dL — ABNORMAL HIGH (ref 70–99)
Glucose-Capillary: 187 mg/dL — ABNORMAL HIGH (ref 70–99)
Glucose-Capillary: 191 mg/dL — ABNORMAL HIGH (ref 70–99)
Glucose-Capillary: 207 mg/dL — ABNORMAL HIGH (ref 70–99)
Glucose-Capillary: 245 mg/dL — ABNORMAL HIGH (ref 70–99)

## 2020-05-01 LAB — CBC
HCT: 26.5 % — ABNORMAL LOW (ref 36.0–46.0)
Hemoglobin: 8 g/dL — ABNORMAL LOW (ref 12.0–15.0)
MCH: 28.4 pg (ref 26.0–34.0)
MCHC: 30.2 g/dL (ref 30.0–36.0)
MCV: 94 fL (ref 80.0–100.0)
Platelets: 331 10*3/uL (ref 150–400)
RBC: 2.82 MIL/uL — ABNORMAL LOW (ref 3.87–5.11)
RDW: 14.4 % (ref 11.5–15.5)
WBC: 8.8 10*3/uL (ref 4.0–10.5)
nRBC: 0.3 % — ABNORMAL HIGH (ref 0.0–0.2)

## 2020-05-01 LAB — COMPREHENSIVE METABOLIC PANEL
ALT: 356 U/L — ABNORMAL HIGH (ref 0–44)
AST: 534 U/L — ABNORMAL HIGH (ref 15–41)
Albumin: 2.2 g/dL — ABNORMAL LOW (ref 3.5–5.0)
Alkaline Phosphatase: 129 U/L — ABNORMAL HIGH (ref 38–126)
Anion gap: 11 (ref 5–15)
BUN: 30 mg/dL — ABNORMAL HIGH (ref 8–23)
CO2: 24 mmol/L (ref 22–32)
Calcium: 8.9 mg/dL (ref 8.9–10.3)
Chloride: 103 mmol/L (ref 98–111)
Creatinine, Ser: 0.65 mg/dL (ref 0.44–1.00)
GFR calc Af Amer: >60 mL/min (ref >60)
GFR calc non Af Amer: >60 mL/min (ref >60)
Glucose, Bld: 203 mg/dL — ABNORMAL HIGH (ref 70–99)
Potassium: 3.4 mmol/L — ABNORMAL LOW (ref 3.5–5.1)
Sodium: 138 mmol/L (ref 135–145)
Total Bilirubin: 0.7 mg/dL (ref 0.3–1.2)
Total Protein: 6.5 g/dL (ref 6.5–8.1)

## 2020-05-01 LAB — MAGNESIUM: Magnesium: 2.1 mg/dL (ref 1.7–2.4)

## 2020-05-01 MED ORDER — SENNA 8.6 MG PO TABS
2.0000 | ORAL_TABLET | Freq: Two times a day (BID) | ORAL | Status: DC
  Administered 2020-05-01: 17.2 mg
  Filled 2020-05-01: qty 2

## 2020-05-01 MED ORDER — IPRATROPIUM-ALBUTEROL 0.5-2.5 (3) MG/3ML IN SOLN
3.0000 mL | Freq: Four times a day (QID) | RESPIRATORY_TRACT | Status: DC
  Administered 2020-05-01 – 2020-05-02 (×5): 3 mL via RESPIRATORY_TRACT
  Filled 2020-05-01 (×5): qty 3

## 2020-05-01 MED ORDER — POTASSIUM CHLORIDE 20 MEQ/15ML (10%) PO SOLN
40.0000 meq | Freq: Once | ORAL | Status: AC
  Administered 2020-05-01: 40 meq
  Filled 2020-05-01: qty 30

## 2020-05-01 NOTE — Progress Notes (Signed)
Chaplain provided support for family in anticipatory grief, including prayer, refreshments and emotional support.  Three daughters, step-mother (of patient!), grandchildren and one great grandchild is present. Available for f/u as needed.  Luana Shu [phone]     05/01/20 1100  Clinical Encounter Type  Visited With Patient and family together  Visit Type Social support;Critical Care  Referral From Nurse  Consult/Referral To Chaplain  Spiritual Encounters  Spiritual Needs Prayer  Stress Factors  Patient Stress Factors Health changes  Family Stress Factors Major life changes

## 2020-05-01 NOTE — Progress Notes (Addendum)
CDS referral #: [id number]

## 2020-05-01 NOTE — Progress Notes (Signed)
eLink Physician-Brief Progress Note Patient Name: CHERESE BEHMER DOB: [birth year] MRN: [id number]   Date of Service  05/01/2020  HPI/Events of Note  Notified of tachycardia and breathing over set rate on vent Patient appears comfortable, given Fentanyl push  eICU Interventions  Likely [place]'s reflex. Ongoing discussion about goals of care     Intervention Category Major Interventions: Other:  Judd Lien 05/01/2020, 9:41 PM

## 2020-05-01 NOTE — Progress Notes (Signed)
NAME:  [name], MRN:  [id number], DOB:  [birth year], LOS: 5 ADMISSION DATE:  04/16/2020, CONSULTATION DATE: 04/27/2020 REFERRING MD: Kathyrn Sheriff, CHIEF COMPLAINT: Status post evacuation subdural hematoma  Brief History   86 yo female presented with AMS after falling at home.  Found to have large Rt SDH with midline shift.  Had Rt frontoparietal craniotomy and remained on vent post op.  Past Medical History  CAD s/p CABG, Depression, Glaucoma, Hydrocephalus s/p VP shunt, HLD, Nephrolithiasis, Spinal stenosis, DM  Significant Hospital Events   5/11 craniotomy 5/13 fever  Consults:    Procedures:  ETT 5/11 >>  Significant Diagnostic Tests:   CT head 5/11 >> large b/l acute on chronic SDH Rt > Lt with Rt to Lt shift, hemorrhage Rt thalamus, extension into lateral and third ventricle.  CT head 5/16 >> increased size of b/l extra axial collections, 3 cm on Rt and 1.9 cm on Lt.  13 mm leftward shift.  Subfalcine herniation and downward transtentorial herniation or Rt uncus.  Micro Data:  SARS CoV2 PCR 5/11 >> negative Blood 5/12 >>  Sputum 5/12 >> oral flora  Antimicrobials:  Vancomycin 5/11 >> 5/12 Ceftriaxone 5/13 >>   Interim history/subjective:  CT head looks worse.  Objective   Blood pressure 110/69, pulse (!) 107, temperature 99 F (37.2 C), temperature source Oral, resp. rate (!) 29, height 5\' 2"  (1.575 m), weight 72.1 kg, SpO2 98 %.    Vent Mode: PSV;CPAP FiO2 (%):  [40 %] 40 % Set Rate:  [28 bmp] 28 bmp Vt Set:  [400 mL] 400 mL PEEP:  [5 cmH20] 5 cmH20 Pressure Support:  [14 cmH20] 14 cmH20 Plateau Pressure:  [11 cmH20-21 cmH20] 17 cmH20   Intake/Output Summary (Last 24 hours) at 05/01/2020 0809 Last data filed at 05/01/2020 0600 Gross per 24 hour  Intake 795 ml  Output 1300 ml  Net -505 ml   Filed Weights   04/30/2020 1842 04/29/20 0500  Weight: 72 kg 72.1 kg    Examination:  General - unresponsive Eyes - pupils pinpoint ENT - ETT in  place Cardiac - regular rate/rhythm, no murmur Chest - decreased breath sounds, no wheeze Abdomen - soft, non tender, + bowel sounds Extremities - no edema Skin - no rashes Neuro - no response to stimuli  Resolved Hospital Problem list     Assessment & Plan:   Acute on chronic hypoxic/hypercapnic respiratory failure with compromised airway. COPD with emphysema and chronic bronchitis. - can wean on pressure support - mental status barrier to extubation - goal SpO2 90 to 95% - f/u CXR intermittently - scheduled pulmicort and duoneb with prn albuterol  Fever with concern for bronchitis. - day 4 of rocephin  Acute on chronic SDH s/p craniotomy. Hx of hydrocephalus s/p VP shunt. - CT head looks much worse on 5/16 - prognosis for meaningful recovery seems grim - neurosurgery planning to discuss goals of care with family - continue keppra  Hx of glaucoma. - continue eye drops  DM type 2 poorly controlled with hyperglycemia. - SSI  Sinus tachycardia. Hx of CAD s/p CABG, HTN, HLD. - hold ASA in setting of SDH - continue lopressor, crestor - hold outpt lisinopril  Anemia of chronic disease. - f/u CBC intermittently - transfuse for Hb < 7 or significant bleeding  Elevated LFTs. - f/u labs intermittently  Best practice:  Diet: tube feeds DVT prophylaxis: SCDs GI prophylaxis: protonix Mobility: Bedrest Code Status: Full Disposition: ICU  Labs:   CMP Latest  Ref Rng & Units 05/01/2020 04/30/2020 04/29/2020  Glucose 70 - 99 mg/dL 203(H) 222(H) 286(H)  BUN 8 - 23 mg/dL 30(H) 25(H) 24(H)  Creatinine 0.44 - 1.00 mg/dL 0.65 0.58 0.73  Sodium 135 - 145 mmol/L 138 139 139  Potassium 3.5 - 5.1 mmol/L 3.4(L) 3.8 3.9  Chloride 98 - 111 mmol/L 103 104 107  CO2 22 - 32 mmol/L 24 23 23   Calcium 8.9 - 10.3 mg/dL 8.9 8.7(L) 8.8(L)  Total Protein 6.5 - 8.1 g/dL 6.5 6.8 6.6  Total Bilirubin 0.3 - 1.2 mg/dL 0.7 0.6 0.6  Alkaline Phos 38 - 126 U/L 129(H) 76 41  AST 15 - 41 U/L  534(H) 258(H) 130(H)  ALT 0 - 44 U/L 356(H) 187(H) 111(H)    CBC Latest Ref Rng & Units 05/01/2020 04/30/2020 04/29/2020  WBC 4.0 - 10.5 K/uL 8.8 12.4(H) 11.7(H)  Hemoglobin 12.0 - 15.0 g/dL 8.0(L) 8.3(L) 8.1(L)  Hematocrit 36.0 - 46.0 % 26.5(L) 27.1(L) 26.7(L)  Platelets 150 - 400 K/uL 331 339 318    ABG    Component Value Date/Time   PHART 7.398 04/27/2020 0132   PCO2ART 45.9 04/27/2020 0132   PO2ART 125 (H) 04/27/2020 0132   HCO3 28.3 (H) 04/27/2020 0132   TCO2 30 04/27/2020 0132   O2SAT 99.0 04/27/2020 0132    CBG (last 3)  Recent Labs    04/30/20 2326 05/01/20 0353 05/01/20 0726  GLUCAP 170* 184* 178*    Critical care time: 32 minutes     Chesley Mires, MD [place] Pager - [phone] 05/01/2020, 8:09 AM

## 2020-05-01 NOTE — Progress Notes (Signed)
Patient ID: [name], female   DOB: February 22, 1933, 86 y.o.   MRN: [id number] CT scan of head reviewed.  She has a very large reaccumulation of her right sided hypodense fluid collection with some hyperdensity.  There is significant mass-effect.  There is a smaller left-sided subdural hematoma that is also greater than a centimeter in size.  She is left hemiplegic and has been since the first surgery.  This is not a new finding.  She opens her eyes to pain.  She almost localizes on the right.  There is some purposeful movement on the right.  I had a long discussion with her 2 daughters.  I think the prognosis for any type of reasonable functional recovery is poor.  I do not believe that repeat craniotomy for reevacuation of the right-sided subdural hematoma will improve her quality of life.  It also increases the risk of enlargement of the left-sided subdural as the right side reexpands.  And therefore this is a very difficult and complex situation in an elderly female with left hemiplegia who remains intubated and now has a large reaccumulation of a previously operated right subdural hematoma.  I have described the imaging to the daughters.  I described her prognosis to the daughters.  We have talked about goals of care.  At this point I am recommending comfort care.  They are going to call their family and have them come in for a family meeting.  I think this is reasonable.  They state "she really has not wanted to be alive for the last 22 years."  This relates back to when her husband died.

## 2020-05-02 DIAGNOSIS — J9601 Acute respiratory failure with hypoxia: Secondary | ICD-10-CM

## 2020-05-02 LAB — CBC
HCT: 29.2 % — ABNORMAL LOW (ref 36.0–46.0)
Hemoglobin: 8.8 g/dL — ABNORMAL LOW (ref 12.0–15.0)
MCH: 28.6 pg (ref 26.0–34.0)
MCHC: 30.1 g/dL (ref 30.0–36.0)
MCV: 94.8 fL (ref 80.0–100.0)
Platelets: 324 10*3/uL (ref 150–400)
RBC: 3.08 MIL/uL — ABNORMAL LOW (ref 3.87–5.11)
RDW: 14.6 % (ref 11.5–15.5)
WBC: 11.7 10*3/uL — ABNORMAL HIGH (ref 4.0–10.5)
nRBC: 0.5 % — ABNORMAL HIGH (ref 0.0–0.2)

## 2020-05-02 LAB — COMPREHENSIVE METABOLIC PANEL
ALT: 558 U/L — ABNORMAL HIGH (ref 0–44)
AST: 646 U/L — ABNORMAL HIGH (ref 15–41)
Albumin: 2.3 g/dL — ABNORMAL LOW (ref 3.5–5.0)
Alkaline Phosphatase: 185 U/L — ABNORMAL HIGH (ref 38–126)
Anion gap: 10 (ref 5–15)
BUN: 32 mg/dL — ABNORMAL HIGH (ref 8–23)
CO2: 24 mmol/L (ref 22–32)
Calcium: 9 mg/dL (ref 8.9–10.3)
Chloride: 108 mmol/L (ref 98–111)
Creatinine, Ser: 0.69 mg/dL (ref 0.44–1.00)
GFR calc Af Amer: >60 mL/min (ref >60)
GFR calc non Af Amer: >60 mL/min (ref >60)
Glucose, Bld: 197 mg/dL — ABNORMAL HIGH (ref 70–99)
Potassium: 4.1 mmol/L (ref 3.5–5.1)
Sodium: 142 mmol/L (ref 135–145)
Total Bilirubin: 0.8 mg/dL (ref 0.3–1.2)
Total Protein: 6.9 g/dL (ref 6.5–8.1)

## 2020-05-02 LAB — CULTURE, BLOOD (ROUTINE X 2)
Culture: NO GROWTH
Culture: NO GROWTH
Special Requests: ADEQUATE
Special Requests: ADEQUATE

## 2020-05-02 LAB — GLUCOSE, CAPILLARY
Glucose-Capillary: 183 mg/dL — ABNORMAL HIGH (ref 70–99)
Glucose-Capillary: 220 mg/dL — ABNORMAL HIGH (ref 70–99)

## 2020-05-02 LAB — MAGNESIUM: Magnesium: 2.1 mg/dL (ref 1.7–2.4)

## 2020-05-02 MED ORDER — GLYCOPYRROLATE 0.2 MG/ML IJ SOLN: 0.2000 mg | INTRAMUSCULAR | Status: DC | PRN

## 2020-05-02 MED ORDER — GLYCOPYRROLATE 1 MG PO TABS
1.0000 mg | ORAL_TABLET | ORAL | Status: DC | PRN
  Filled 2020-05-02: qty 1

## 2020-05-02 MED ORDER — POLYVINYL ALCOHOL 1.4 % OP SOLN
1.0000 [drp] | Freq: Four times a day (QID) | OPHTHALMIC | Status: DC | PRN
  Filled 2020-05-02: qty 15

## 2020-05-02 MED ORDER — MORPHINE 100MG IN NS 100ML (1MG/ML) PREMIX INFUSION
10.0000 mg/h | INTRAVENOUS | Status: DC
  Administered 2020-05-02 (×2): 10 mg/h via INTRAVENOUS
  Filled 2020-05-02 (×4): qty 100

## 2020-05-02 MED ORDER — GLYCOPYRROLATE 0.2 MG/ML IJ SOLN
0.2000 mg | INTRAMUSCULAR | Status: DC | PRN
  Administered 2020-05-02: 0.2 mg via INTRAVENOUS
  Filled 2020-05-02: qty 1

## 2020-05-02 MED ORDER — DIPHENHYDRAMINE HCL 50 MG/ML IJ SOLN: 25.0000 mg | INTRAMUSCULAR | Status: DC | PRN

## 2020-05-02 MED ORDER — SODIUM CHLORIDE 0.9 % IV BOLUS: 500.0000 mL | Freq: Once | INTRAVENOUS | Status: DC

## 2020-05-02 MED ORDER — DEXTROSE 5 % IV SOLN: INTRAVENOUS | Status: DC

## 2020-05-02 MED ORDER — ACETAMINOPHEN 650 MG RE SUPP: 650.0000 mg | Freq: Four times a day (QID) | RECTAL | Status: DC | PRN

## 2020-05-02 MED ORDER — MORPHINE SULFATE (PF) 2 MG/ML IV SOLN
2.0000 mg | INTRAVENOUS | Status: DC | PRN
  Administered 2020-05-02 (×3): 4 mg via INTRAVENOUS
  Filled 2020-05-02 (×3): qty 2

## 2020-05-02 MED ORDER — ACETAMINOPHEN 325 MG PO TABS: 650.0000 mg | ORAL_TABLET | Freq: Four times a day (QID) | ORAL | Status: DC | PRN

## 2020-05-02 NOTE — Progress Notes (Signed)
Extubated patient for comfort care

## 2020-05-02 NOTE — Progress Notes (Signed)
  NEUROSURGERY PROGRESS NOTE   No issues overnight.  EXAM:  BP (!) 99/53   Pulse (!) 105   Temp (!) 100.4 F (38 C) (Axillary)   Resp (!) 28   Ht 5\' 2"  (1.575 m)   Wt 72.1 kg   SpO2 96%   BMI 29.07 kg/m   Intubated Awakens to painful stimuli Pupils 55mm, right brisk, left sluggishly reactive Not following commands Incision c/d/i  IMPRESSION/PLAN 86 y.o. female POD #6s/p right crani for SDH. Repeat head CT shows significant re-accumulation. Patient no longer following commands. Patient being transitioned to comfort care with compassionate one-way extubation later today per nursing.

## 2020-05-02 NOTE — Care Management Important Message (Signed)
Important Message  Patient Details  Name: [name] MRN: [id number] Date of Birth: 11/12/1933   Medicare Important Message Given:  Yes     [name] 05/02/2020, 1:56 PM

## 2020-05-02 NOTE — Progress Notes (Signed)
Nutrition Brief Note  Chart reviewed. Pt now transitioning to comfort care.  No further nutrition interventions planned at this time.  Please re-consult as needed.   Malaky Tetrault P., RD, LDN, CNSC See AMiON for contact information    

## 2020-05-02 NOTE — Progress Notes (Signed)
   NAME:  [name], MRN:  [id number], DOB:  Dec 23, 1932, LOS: 6 ADMISSION DATE:  04/25/2020, CONSULTATION DATE: 04/27/2020 REFERRING MD: Kathyrn Sheriff, CHIEF COMPLAINT: Status post evacuation subdural hematoma  Brief History   86 yo female presented with AMS after falling at home.  Found to have large Rt SDH with midline shift.  Had Rt frontoparietal craniotomy and remained on vent post op.  Past Medical History  CAD s/p CABG, Depression, Glaucoma, Hydrocephalus s/p VP shunt, HLD, Nephrolithiasis, Spinal stenosis, DM  Significant Hospital Events   5/11 craniotomy 5/13 fever  Consults:    Procedures:  ETT 5/11 >> 5/17  Significant Diagnostic Tests:   CT head 5/11 >> large b/l acute on chronic SDH Rt > Lt with Rt to Lt shift, hemorrhage Rt thalamus, extension into lateral and third ventricle.  CT head 5/16 >> increased size of b/l extra axial collections, 3 cm on Rt and 1.9 cm on Lt.  13 mm leftward shift.  Subfalcine herniation and downward transtentorial herniation or Rt uncus.  Micro Data:  SARS CoV2 PCR 5/11 >> negative Blood 5/12 >>  Sputum 5/12 >> oral flora  Antimicrobials:  Vancomycin 5/11 >> 5/12 Ceftriaxone 5/13 >> stopped  Interim history/subjective:  Now extubated  Objective   Blood pressure (Abnormal) 102/58, pulse (Abnormal) 103, temperature 100 F (37.8 C), temperature source Axillary, resp. rate (Abnormal) 28, height 5\' 2"  (1.575 m), weight 72.1 kg, SpO2 (Abnormal) 87 %.    Vent Mode: PRVC FiO2 (%):  [40 %] 40 % Set Rate:  [28 bmp] 28 bmp Vt Set:  [400 mL] 400 mL PEEP:  [5 cmH20] 5 cmH20 Pressure Support:  [14 cmH20] 14 cmH20 Plateau Pressure:  [17 cmH20-18 cmH20] 17 cmH20   Intake/Output Summary (Last 24 hours) at 05/02/2020 1112 Last data filed at 05/02/2020 0700 Gross per 24 hour  Intake 1690 ml  Output 1002 ml  Net 688 ml   Filed Weights   05/15/2020 1842 04/29/20 0500  Weight: 72 kg 72.1 kg    Examination:  General 86 year old white  female now extubated HEENT normocephalic atraumatic mucous membranes dry Pulmonary tachypneic, some upper airway wheezing Cardiac regular rate and rhythm Neuro unresponsive Extremities warm dry  Resolved Hospital Problem list     Assessment & Plan:   Acute on chronic hypoxic/hypercapnic respiratory failure with compromised airway. COPD with emphysema and chronic bronchitis. Fever with concern for bronchitis. Acute on chronic SDH s/p craniotomy. Hx of hydrocephalus s/p VP shunt. -Hx of glaucoma. DM type 2 poorly controlled with hyperglycemia Sinus tachycardia. Hx of CAD s/p CABG, HTN, HLD. Anemia of chronic disease. Elevated LFTs.   Discussion Now postop day 6 after right craniotomy for subdural hemorrhage follow-up imaging showing significant reaccumulation.  Per surgical team discussion with family the decision has been made to transition to comfort, she was extubated earlier this morning  Plan Continue comfort oriented care Started with as needed morphine, will advance to IV drip as she appears uncomfortable Critical care will sign off  Erick Colace ACNP-BC [place] Pager # [phone] OR # [phone] if no answer

## 2020-05-04 DIAGNOSIS — J449 Chronic obstructive pulmonary disease, unspecified: Secondary | ICD-10-CM | POA: Diagnosis not present

## 2020-05-04 DIAGNOSIS — J189 Pneumonia, unspecified organism: Secondary | ICD-10-CM | POA: Diagnosis not present

## 2020-05-17 NOTE — Progress Notes (Addendum)
Patient arrived to 4NP13 Agonal breathing and faint Apical heartbeat. While talking with daughter Zigmund Daniel in the room noticed patient had stopped breathing totally and eyes had opened slightly.  Assessed patient with no heartbeat and no pulse confirmed by  Martinique Weaver RN with no blood pressure.  Daughter stated she felt the presence of her father in the other room earlier. Support given to the daughter. Call to Organ donor and Dr . Margy Clarks the remains of Morphine drip in the sink witnessed by Gailen Shelter RN.

## 2020-05-17 NOTE — Death Summary Note (Signed)
DEATH SUMMARY   Patient Details  Name: [name] MRN: [id number] DOB: February 26, 1933  Admission/Discharge Information   Admit Date:  May 15, 2020  Date of Death: Date of Death: May 22, 2020  Time of Death: Time of Death: 03/30/2023  Length of Stay: 7  Referring Physician: Celene Squibb, MD   Reason(s) for Hospitalization  Acute Subdural Hematoma  Diagnoses  Preliminary cause of death:  Secondary Diagnoses (including complications and co-morbidities):  Active Problems:   Subdural hematoma (HCC)   Acute hypoxemic respiratory failure [facility])   Brief Hospital Course (including significant findings, care, treatment, and services provided and events leading to death)  [name] is a 86 y.o. year old female who presented to the hospital after being found down by her family.  She had suffered multiple falls during the day.  Initial work-up in the emergency department revealed large acute right-sided subdural hematoma with significant midline shift and mass-effect.  After discussion with her family regarding treatment options, we elected to proceed with right-sided craniotomy for evacuation of subdural hematoma which was performed without complication.  The patient was returned to the intensive care unit where she was monitored.  Initial postoperative CT scan demonstrated good evacuation with improvement of mass-effect.  Unfortunately she demonstrated no improvement neurologically.  In fact, a few days later she was noted to be significantly worsen in terms of neurologic exam.  Repeat CT scan did demonstrate significant reaccumulation of the subdural hematoma on the right side.  Given the patient's advanced age and poor neurologic exam, the decision was made not to pursue further surgical intervention.  Patient's CODE STATUS was changed to DNR and after discussion with the family we elected to change to comfort care only.  The patient was compassionately extubated and subsequently  expired.    Pertinent Labs and Studies  Significant Diagnostic Studies CT HEAD WO CONTRAST  Addendum Date: 05/01/2020   ADDENDUM REPORT: 05/01/2020 02:13 ADDENDUM: Critical Value/emergent results were called by telephone at the time of interpretation on 05/01/2020 at 2:13 am to provider Porfirio Mylar, who verbally acknowledged these results. Electronically Signed   By: Ulyses Jarred M.D.   On: 05/01/2020 02:13   Result Date: 05/01/2020 CLINICAL DATA:  Subdural hematoma follow-up EXAM: CT HEAD WITHOUT CONTRAST TECHNIQUE: Contiguous axial images were obtained from the base of the skull through the vertex without intravenous contrast. COMPARISON:  None. FINDINGS: Brain: Increased size of bilateral extra-axial collections. The right-sided collection now measures 3.0 cm and the left 1.9 cm. The lateral ventricles are effaced. There is subfalcine herniation and downward transtentorial herniation of the right uncus. The basal cisterns are effaced. Midline shift now measures 13 mm right to left. Small amount of acute intraparenchymal blood in the right thalamus. Vascular: No hyperdense vessel or unexpected calcification. Skull: Recent right-sided craniotomy. Sinuses/Orbits: No acute finding. Other: None. IMPRESSION: 1. Increased size of bilateral extra-axial collections, measuring up to 3.0 cm on the right and 1.9 cm on the left. 13 mm leftward midline shift. 2. Subfalcine herniation and downward transtentorial herniation of the right uncus. 3. Small amount of acute intraparenchymal blood in the right thalamus. Electronically Signed: By: Ulyses Jarred M.D. On: 05/01/2020 01:50   CT HEAD WO CONTRAST  Result Date: 04/27/2020 CLINICAL DATA:  Follow-up subdural hematoma. EXAM: CT HEAD WITHOUT CONTRAST TECHNIQUE: Contiguous axial images were obtained from the base of the skull through the vertex without intravenous contrast. COMPARISON:  CT head from yesterday. FINDINGS: Brain: Interval right frontal craniotomy for  evacuation of  subdural hematoma. Expected pneumocephalus with new subdural drain. Interval decrease in size of the right cerebral convexity subdural hematoma extending along the falx and tentorium. Maximal thickness measures up to 2.0 cm, previously 3.0 cm. Decreased but persistent effacement of the right lateral and third ventricles. Unchanged 7 mm right to left midline shift. Unchanged right-sided approach ventriculostomy and small volume intraventricular hemorrhage. Unchanged small intraparenchymal hematoma superior right thalamus slight interval decrease in size of the small left cerebral convexity subdural hematoma, currently measuring 10 mm in maximal thickness, previously 12 mm. No evidence of acute infarction or mass lesion. Vascular: Atherosclerotic vascular calcification of the carotid siphons. No hyperdense vessel. Skull: Interval right frontal craniotomy. No fracture or focal lesion. Sinuses/Orbits: No acute finding. Other: None. IMPRESSION: 1. Interval right frontal craniotomy with expected postsurgical changes and interval decrease in size of the right cerebral convexity subdural hematoma. Unchanged 7 mm right to left midline shift. Decreased but persistent effacement of the right lateral and third ventricles. 2. Unchanged small intraparenchymal hematoma in the superior right thalamus. 3. Slight interval decrease in size of the small left cerebral convexity subdural hematoma. 4. Unchanged right-sided approach ventriculostomy and small volume intraventricular hemorrhage. Electronically Signed   By: Titus Dubin M.D.   On: 04/27/2020 07:44   CT Head Wo Contrast  Result Date: 04/20/2020 CLINICAL DATA:  Recent fall with headaches and subsequent syncopal episode EXAM: CT HEAD WITHOUT CONTRAST CT CERVICAL SPINE WITHOUT CONTRAST TECHNIQUE: Multidetector CT imaging of the head and cervical spine was performed following the standard protocol without intravenous contrast. Multiplanar CT image  reconstructions of the cervical spine were also generated. COMPARISON:  10/21/2008 FINDINGS: CT HEAD FINDINGS Brain: There are changes consistent with bilateral acute on chronic subdural hematomas. This is larger on the right with thickness of approximately 2.6 cm. On the left the largest thickness of the hematoma is approximately 1.3 cm. The acute nature is greater on the right with considerable mass effect and approximately 7 mm of midline shift from right to left. There is also findings suggestive of parenchymal hemorrhage along the right lateral ventricle in the right thalamus superiorly with evidence of extension into the lateral and third ventricles with some casting noted suggesting a more chronic bleed into the ventricle. Tentorial subdural hematoma is noted right greater than left. No definitive mass lesion is seen. A ventriculostomy catheter is noted extending into the left lateral ventricle stable from the prior exam. Vascular: No hyperdense vessel or unexpected calcification. Skull: Normal. Negative for fracture or focal lesion. Sinuses/Orbits: No acute finding. Other: None. CT CERVICAL SPINE FINDINGS Alignment: Within normal limits. Skull base and vertebrae: 7 cervical segments are well visualized. Mild facet hypertrophic changes are seen. No findings to suggest acute fracture or acute facet abnormality are noted. Only minimal osteophytic changes are seen. Soft tissues and spinal canal: Surrounding soft tissue structures show vascular calcification. Endotracheal tube is noted extending into the trachea. Fluid is noted within the proximal esophagus likely related to reflux. Upper chest: Visualized lung apices demonstrate some mild scarring. Other: None IMPRESSION: CT of the head: Large bilateral acute on chronic subdural hematomas right greater than left with midline shift from right to left as described. Additionally there are findings consistent with parenchymal hemorrhage within the right thalamus  with extension into the lateral ventricles and third ventricle. Some casting is noted of the clot suggesting a slightly more subacute process. CT of the cervical spine: Mild degenerative change without acute abnormality. Critical Value/emergent results were called by telephone  at the time of interpretation on 05/06/2020 at 8:02 pm to Dr. Noemi Chapel , who verbally acknowledged these results. Electronically Signed   By: Inez Catalina M.D.   On: 04/17/2020 20:11   CT Cervical Spine Wo Contrast  Result Date: 05/01/2020 CLINICAL DATA:  Recent fall with headaches and subsequent syncopal episode EXAM: CT HEAD WITHOUT CONTRAST CT CERVICAL SPINE WITHOUT CONTRAST TECHNIQUE: Multidetector CT imaging of the head and cervical spine was performed following the standard protocol without intravenous contrast. Multiplanar CT image reconstructions of the cervical spine were also generated. COMPARISON:  10/21/2008 FINDINGS: CT HEAD FINDINGS Brain: There are changes consistent with bilateral acute on chronic subdural hematomas. This is larger on the right with thickness of approximately 2.6 cm. On the left the largest thickness of the hematoma is approximately 1.3 cm. The acute nature is greater on the right with considerable mass effect and approximately 7 mm of midline shift from right to left. There is also findings suggestive of parenchymal hemorrhage along the right lateral ventricle in the right thalamus superiorly with evidence of extension into the lateral and third ventricles with some casting noted suggesting a more chronic bleed into the ventricle. Tentorial subdural hematoma is noted right greater than left. No definitive mass lesion is seen. A ventriculostomy catheter is noted extending into the left lateral ventricle stable from the prior exam. Vascular: No hyperdense vessel or unexpected calcification. Skull: Normal. Negative for fracture or focal lesion. Sinuses/Orbits: No acute finding. Other: None. CT CERVICAL  SPINE FINDINGS Alignment: Within normal limits. Skull base and vertebrae: 7 cervical segments are well visualized. Mild facet hypertrophic changes are seen. No findings to suggest acute fracture or acute facet abnormality are noted. Only minimal osteophytic changes are seen. Soft tissues and spinal canal: Surrounding soft tissue structures show vascular calcification. Endotracheal tube is noted extending into the trachea. Fluid is noted within the proximal esophagus likely related to reflux. Upper chest: Visualized lung apices demonstrate some mild scarring. Other: None IMPRESSION: CT of the head: Large bilateral acute on chronic subdural hematomas right greater than left with midline shift from right to left as described. Additionally there are findings consistent with parenchymal hemorrhage within the right thalamus with extension into the lateral ventricles and third ventricle. Some casting is noted of the clot suggesting a slightly more subacute process. CT of the cervical spine: Mild degenerative change without acute abnormality. Critical Value/emergent results were called by telephone at the time of interpretation on 05/16/2020 at 8:02 pm to Dr. Noemi Chapel , who verbally acknowledged these results. Electronically Signed   By: Inez Catalina M.D.   On: 04/29/2020 20:11   DG Chest Port 1 View  Result Date: 05/01/2020 CLINICAL DATA:  86 yo female presented with AMS after falling at home. Found to have large Rt SDH with midline shift. Had Rt frontoparietal craniotomy and remained on vent post op. Respiratory failure EXAM: PORTABLE CHEST 1 VIEW COMPARISON:  04/29/2020 and earlier studies. FINDINGS: Stable changes from CABG surgery. Left lung base opacity, extending from the inferior left hilum, consistent with a combination of a small effusion and either atelectasis or infiltrate. Mild opacity at the right lung base is consistent with atelectasis. Remainder of the lungs is clear. No pulmonary edema. No  pneumothorax. Endotracheal tube and nasal/orogastric tube are stable and well positioned. IMPRESSION: 1. No significant interval change from the most recent prior study. 2. Persistent left lower lung zone opacity likely due to a combination of a small effusion with atelectasis or  pneumonia. Mild right lung base atelectasis. 3. Stable well-positioned support apparatus. Electronically Signed   By: Lajean Manes M.D.   On: 05/01/2020 09:46   DG CHEST PORT 1 VIEW  Result Date: 04/29/2020 CLINICAL DATA:  Respiratory failure. EXAM: PORTABLE CHEST 1 VIEW COMPARISON:  Chest x-ray from yesterday. FINDINGS: Unchanged endotracheal and enteric tubes. VP shunt catheter tubing again noted overlying the right hemithorax. The heart size and mediastinal contours are within normal limits. Prior CABG. Chronic mild interstitial coarsening, unchanged. Normal pulmonary vascularity. Unchanged left basilar atelectasis. The right lung is clear. No pneumothorax or large pleural effusion. No acute osseous abnormality. IMPRESSION: Stable chest with left basilar atelectasis. Electronically Signed   By: Titus Dubin M.D.   On: 04/29/2020 07:23   DG CHEST PORT 1 VIEW  Result Date: 04/28/2020 CLINICAL DATA:  Respiratory failure. EXAM: PORTABLE CHEST 1 VIEW COMPARISON:  Chest x-ray dated Apr 26, 2020. FINDINGS: Unchanged endotracheal tube. New enteric tube entering the stomach with the tip below the field of view. VP shunt catheter tubing again noted overlying the right hemithorax. Stable cardiomediastinal silhouette. Prior CABG. Chronic mild interstitial coarsening, similar to prior studies. Normal pulmonary vascularity. Unchanged left basilar atelectasis. The right lung is clear. No pneumothorax or large pleural effusion. No acute osseous abnormality. IMPRESSION: 1. Unchanged left basilar atelectasis. Electronically Signed   By: Titus Dubin M.D.   On: 04/28/2020 07:28   DG Chest Port 1 View  Result Date: 05/09/2020 CLINICAL  DATA:  Status post intubation today. EXAM: PORTABLE CHEST 1 VIEW COMPARISON:  Single-view of the chest earlier today. FINDINGS: New endotracheal tube is in place with the tip in good position at the level of the clavicular heads. Right IJ catheter is unchanged. Left basilar atelectasis noted. Right lung clear. No pneumothorax or pleural effusion. Heart size upper normal. IMPRESSION: Endotracheal tube in good position.  No acute disease. Electronically Signed   By: Inge Rise M.D.   On: 04/23/2020 20:02   DG Chest Portable 1 View  Result Date: 04/22/2020 CLINICAL DATA:  Altered mental status EXAM: PORTABLE CHEST 1 VIEW COMPARISON:  07/25/2018 FINDINGS: Cardiac shadow is mildly prominent but stable. Postsurgical changes are seen. Right sided VP shunt catheter is noted and stable. The lungs are well aerated bilaterally. Chronic interstitial changes are seen without focal infiltrate. No bony abnormality is noted IMPRESSION: Chronic changes without acute abnormality. Electronically Signed   By: Inez Catalina M.D.   On: 05/14/2020 19:26   DG Abd Portable 1V  Result Date: 04/27/2020 CLINICAL DATA:  Enteric catheter placement EXAM: PORTABLE ABDOMEN - 1 VIEW COMPARISON:  05/07/2020 FINDINGS: Frontal view of the lower chest and upper abdomen demonstrates enteric catheter tip and side port projecting over the gastric body. Relative paucity of bowel gas. Patchy consolidation seen at the left lung base, favor atelectasis. IMPRESSION: 1. Enteric catheter overlying gastric body. Electronically Signed   By: Randa Ngo M.D.   On: 04/27/2020 01:16    Microbiology Recent Results (from the past 240 hour(s))  SARS Coronavirus 2 by RT PCR (hospital order, performed in [facility] hospital lab) Nasopharyngeal Nasopharyngeal Swab     Status: None   Collection Time: 05/06/2020  7:25 PM   Specimen: Nasopharyngeal Swab  Result Value Ref Range Status   SARS Coronavirus 2 NEGATIVE NEGATIVE Final    Comment:  (NOTE) SARS-CoV-2 target nucleic acids are NOT DETECTED. The SARS-CoV-2 RNA is generally detectable in upper and lower respiratory specimens during the acute phase of infection. The lowest  concentration of SARS-CoV-2 viral copies this assay can detect is 250 copies / mL. A negative result does not preclude SARS-CoV-2 infection and should not be used as the sole basis for treatment or other patient management decisions.  A negative result may occur with improper specimen collection / handling, submission of specimen other than nasopharyngeal swab, presence of viral mutation(s) within the areas targeted by this assay, and inadequate number of viral copies (<250 copies / mL). A negative result must be combined with clinical observations, patient history, and epidemiological information. Fact Sheet for Patients:   StrictlyIdeas.no Fact Sheet for Healthcare Providers: BankingDealers.co.za This test is not yet approved or cleared  by the [place] FDA and has been authorized for detection and/or diagnosis of SARS-CoV-2 by FDA under an Emergency Use Authorization (EUA).  This EUA will remain in effect (meaning this test can be used) for the duration of the COVID-19 declaration under Section 564(b)(1) of the Act, 21 U.S.C. section 360bbb-3(b)(1), unless the authorization is terminated or revoked sooner. Performed at [facility], [address] [place], [place] [address]   MRSA PCR Screening     Status: None   Collection Time: 04/27/20 12:58 AM   Specimen: Nasopharyngeal  Result Value Ref Range Status   MRSA by PCR NEGATIVE NEGATIVE Final    Comment:        The GeneXpert MRSA Assay (FDA approved for NASAL specimens only), is one component of a comprehensive MRSA colonization surveillance program. It is not intended to diagnose MRSA infection nor to guide or monitor treatment for MRSA infections. Performed at [facility],  [place] [address], [place], [place] [address]   Culture, respiratory     Status: None   Collection Time: 04/27/20  2:39 AM   Specimen: Tracheal Aspirate; Respiratory  Result Value Ref Range Status   Specimen Description TRACHEAL ASPIRATE  Final   Special Requests NONE  Final   Gram Stain   Final    FEW WBC PRESENT, PREDOMINANTLY PMN RARE GRAM POSITIVE COCCI IN CLUSTERS RARE GRAM NEGATIVE COCCOBACILLI    Culture   Final    FEW Consistent with normal respiratory flora. Performed at [facility], [place] [address] [place], [place] [address]    Report Status 04/30/2020 FINAL  Final  Culture, blood (Routine X 2) w Reflex to ID Panel     Status: None   Collection Time: 04/27/20 12:56 PM   Specimen: BLOOD  Result Value Ref Range Status   Specimen Description BLOOD BLOOD RIGHT HAND  Final   Special Requests AEROBIC BOTTLE ONLY Blood Culture adequate volume  Final   Culture   Final    NO GROWTH 5 DAYS Performed at [facility], [place] [address] [place], [place] [address]    Report Status 05/02/2020 FINAL  Final  Culture, blood (Routine X 2) w Reflex to ID Panel     Status: None   Collection Time: 04/27/20  1:03 PM   Specimen: BLOOD  Result Value Ref Range Status   Specimen Description BLOOD BLOOD RIGHT HAND  Final   Special Requests AEROBIC BOTTLE ONLY Blood Culture adequate volume  Final   Culture   Final    NO GROWTH 5 DAYS Performed at [facility], [place] [address], [place], [place] [address]    Report Status 05/02/2020 FINAL  Final    Lab Basic Metabolic Panel: Recent Labs  Lab 04/29/20 1602 04/30/20 0237 05/01/20 0533 05/02/20 0303  NA  --  139 138 142  K  --  3.8 3.4* 4.1  CL  --  104 103 108  CO2  --  23 24 24   GLUCOSE  --  222* 203* 197*  BUN  --  25* 30* 32*  CREATININE  --  0.58 0.65 0.69  CALCIUM  --  8.7* 8.9 9.0  MG 2.1 2.0 2.1 2.1  PHOS 2.3* 2.6  --   --    Liver Function Tests: Recent Labs  Lab 04/30/20 0237 05/01/20 0533 05/02/20 0303   AST 258* 534* 646*  ALT 187* 356* 558*  ALKPHOS 76 129* 185*  BILITOT 0.6 0.7 0.8  PROT 6.8 6.5 6.9  ALBUMIN 2.6* 2.2* 2.3*   No results for input(s): LIPASE, AMYLASE in the last 168 hours. Recent Labs  Lab 04/29/20 1044  AMMONIA 36*   CBC: Recent Labs  Lab 04/30/20 0237 05/01/20 0533 05/02/20 0303  WBC 12.4* 8.8 11.7*  HGB 8.3* 8.0* 8.8*  HCT 27.1* 26.5* 29.2*  MCV 93.4 94.0 94.8  PLT 339 331 324   Cardiac Enzymes: No results for input(s): CKTOTAL, CKMB, CKMBINDEX, TROPONINI in the last 168 hours. Sepsis Labs: Recent Labs  Lab 04/30/20 0237 05/01/20 0533 05/02/20 0303  WBC 12.4* 8.8 11.7*    Procedures/Operations  Right craniotomy for evacuation of subdural hematoma   Bradley Ferris Madylin Fairbank 05/06/2020, 8:17 AM

## 2020-05-17 NOTE — Progress Notes (Signed)
Call to Dr Cleotilde Neer answering service to let them know patient expired @ 0025. Dr Mellody Drown returned call and given information.

## 2020-05-17 DEATH — deceased

## 2020-08-11 ENCOUNTER — Ambulatory Visit (HOSPITAL_COMMUNITY): Payer: Medicare Other
